# Patient Record
Sex: Female | Born: 1945 | Race: White | Hispanic: No | Smoking: Former smoker
Health system: Southern US, Community
[De-identification: ages and names within clinical notes are randomized; demographics above are authoritative.]

## PROBLEM LIST (undated history)

## (undated) DIAGNOSIS — K219 Gastro-esophageal reflux disease without esophagitis: Secondary | ICD-10-CM

## (undated) DIAGNOSIS — I519 Heart disease, unspecified: Secondary | ICD-10-CM

## (undated) DIAGNOSIS — E785 Hyperlipidemia, unspecified: Secondary | ICD-10-CM

## (undated) DIAGNOSIS — J45909 Unspecified asthma, uncomplicated: Secondary | ICD-10-CM

## (undated) DIAGNOSIS — Z952 Presence of prosthetic heart valve: Secondary | ICD-10-CM

## (undated) DIAGNOSIS — I10 Essential (primary) hypertension: Secondary | ICD-10-CM

## (undated) DIAGNOSIS — I350 Nonrheumatic aortic (valve) stenosis: Secondary | ICD-10-CM

## (undated) DIAGNOSIS — I509 Heart failure, unspecified: Secondary | ICD-10-CM

## (undated) DIAGNOSIS — I351 Nonrheumatic aortic (valve) insufficiency: Secondary | ICD-10-CM

## (undated) DIAGNOSIS — M1990 Unspecified osteoarthritis, unspecified site: Secondary | ICD-10-CM

## (undated) DIAGNOSIS — G2581 Restless legs syndrome: Secondary | ICD-10-CM

## (undated) DIAGNOSIS — C50919 Malignant neoplasm of unspecified site of unspecified female breast: Secondary | ICD-10-CM

## (undated) HISTORY — PX: BREAST SURGERY: SHX581

## (undated) HISTORY — PX: CATARACT EXTRACTION: SUR2

## (undated) HISTORY — DX: Restless legs syndrome: G25.81

## (undated) HISTORY — DX: Heart failure, unspecified: I50.9

## (undated) HISTORY — PX: BELPHAROPTOSIS REPAIR: SHX369

## (undated) HISTORY — PX: ROTATOR CUFF REPAIR: SHX139

## (undated) HISTORY — PX: TONSILLECTOMY: SUR1361

## (undated) HISTORY — PX: ABDOMINAL HYSTERECTOMY: SHX81

## (undated) HISTORY — DX: Heart disease, unspecified: I51.9

## (undated) HISTORY — DX: Nonrheumatic aortic (valve) insufficiency: I35.1

## (undated) HISTORY — PX: JOINT REPLACEMENT: SHX530

## (undated) HISTORY — DX: Hyperlipidemia, unspecified: E78.5

## (undated) HISTORY — PX: EYE SURGERY: SHX253

## (undated) HISTORY — DX: Essential (primary) hypertension: I10

---

## 2011-08-02 DIAGNOSIS — Z01419 Encounter for gynecological examination (general) (routine) without abnormal findings: Secondary | ICD-10-CM | POA: Diagnosis not present

## 2011-08-02 DIAGNOSIS — Z1272 Encounter for screening for malignant neoplasm of vagina: Secondary | ICD-10-CM | POA: Diagnosis not present

## 2011-08-02 DIAGNOSIS — N72 Inflammatory disease of cervix uteri: Secondary | ICD-10-CM | POA: Diagnosis not present

## 2011-08-02 DIAGNOSIS — Z0189 Encounter for other specified special examinations: Secondary | ICD-10-CM | POA: Diagnosis not present

## 2011-08-02 DIAGNOSIS — N880 Leukoplakia of cervix uteri: Secondary | ICD-10-CM | POA: Diagnosis not present

## 2011-08-02 DIAGNOSIS — Z124 Encounter for screening for malignant neoplasm of cervix: Secondary | ICD-10-CM | POA: Diagnosis not present

## 2011-08-02 DIAGNOSIS — Z1289 Encounter for screening for malignant neoplasm of other sites: Secondary | ICD-10-CM | POA: Diagnosis not present

## 2011-08-07 DIAGNOSIS — S93409A Sprain of unspecified ligament of unspecified ankle, initial encounter: Secondary | ICD-10-CM | POA: Diagnosis not present

## 2011-08-15 DIAGNOSIS — N959 Unspecified menopausal and perimenopausal disorder: Secondary | ICD-10-CM | POA: Diagnosis not present

## 2011-08-17 DIAGNOSIS — B351 Tinea unguium: Secondary | ICD-10-CM | POA: Diagnosis not present

## 2011-08-17 DIAGNOSIS — L600 Ingrowing nail: Secondary | ICD-10-CM | POA: Diagnosis not present

## 2011-08-17 DIAGNOSIS — S90129A Contusion of unspecified lesser toe(s) without damage to nail, initial encounter: Secondary | ICD-10-CM | POA: Diagnosis not present

## 2011-10-02 DIAGNOSIS — M25559 Pain in unspecified hip: Secondary | ICD-10-CM | POA: Diagnosis not present

## 2011-10-11 DIAGNOSIS — IMO0002 Reserved for concepts with insufficient information to code with codable children: Secondary | ICD-10-CM | POA: Diagnosis not present

## 2011-10-23 DIAGNOSIS — K589 Irritable bowel syndrome without diarrhea: Secondary | ICD-10-CM | POA: Diagnosis not present

## 2011-10-23 DIAGNOSIS — K8689 Other specified diseases of pancreas: Secondary | ICD-10-CM | POA: Diagnosis not present

## 2011-10-23 DIAGNOSIS — R142 Eructation: Secondary | ICD-10-CM | POA: Diagnosis not present

## 2011-10-23 DIAGNOSIS — R198 Other specified symptoms and signs involving the digestive system and abdomen: Secondary | ICD-10-CM | POA: Diagnosis not present

## 2011-10-24 DIAGNOSIS — IMO0002 Reserved for concepts with insufficient information to code with codable children: Secondary | ICD-10-CM | POA: Diagnosis not present

## 2011-10-26 DIAGNOSIS — IMO0002 Reserved for concepts with insufficient information to code with codable children: Secondary | ICD-10-CM | POA: Diagnosis not present

## 2011-10-30 DIAGNOSIS — IMO0002 Reserved for concepts with insufficient information to code with codable children: Secondary | ICD-10-CM | POA: Diagnosis not present

## 2011-11-01 DIAGNOSIS — IMO0002 Reserved for concepts with insufficient information to code with codable children: Secondary | ICD-10-CM | POA: Diagnosis not present

## 2011-11-06 DIAGNOSIS — IMO0002 Reserved for concepts with insufficient information to code with codable children: Secondary | ICD-10-CM | POA: Diagnosis not present

## 2011-11-08 DIAGNOSIS — IMO0002 Reserved for concepts with insufficient information to code with codable children: Secondary | ICD-10-CM | POA: Diagnosis not present

## 2011-11-13 DIAGNOSIS — Z79899 Other long term (current) drug therapy: Secondary | ICD-10-CM | POA: Diagnosis not present

## 2011-11-13 DIAGNOSIS — M25559 Pain in unspecified hip: Secondary | ICD-10-CM | POA: Diagnosis not present

## 2011-11-13 DIAGNOSIS — D6489 Other specified anemias: Secondary | ICD-10-CM | POA: Diagnosis not present

## 2011-11-13 DIAGNOSIS — C50319 Malignant neoplasm of lower-inner quadrant of unspecified female breast: Secondary | ICD-10-CM | POA: Diagnosis not present

## 2011-11-16 DIAGNOSIS — L0889 Other specified local infections of the skin and subcutaneous tissue: Secondary | ICD-10-CM | POA: Diagnosis not present

## 2011-11-16 DIAGNOSIS — IMO0002 Reserved for concepts with insufficient information to code with codable children: Secondary | ICD-10-CM | POA: Diagnosis not present

## 2011-11-20 DIAGNOSIS — IMO0002 Reserved for concepts with insufficient information to code with codable children: Secondary | ICD-10-CM | POA: Diagnosis not present

## 2011-11-22 DIAGNOSIS — IMO0002 Reserved for concepts with insufficient information to code with codable children: Secondary | ICD-10-CM | POA: Diagnosis not present

## 2011-11-26 DIAGNOSIS — E785 Hyperlipidemia, unspecified: Secondary | ICD-10-CM | POA: Diagnosis not present

## 2011-11-27 DIAGNOSIS — IMO0002 Reserved for concepts with insufficient information to code with codable children: Secondary | ICD-10-CM | POA: Diagnosis not present

## 2011-11-29 DIAGNOSIS — IMO0002 Reserved for concepts with insufficient information to code with codable children: Secondary | ICD-10-CM | POA: Diagnosis not present

## 2011-12-11 DIAGNOSIS — I83893 Varicose veins of bilateral lower extremities with other complications: Secondary | ICD-10-CM | POA: Diagnosis not present

## 2011-12-19 DIAGNOSIS — E785 Hyperlipidemia, unspecified: Secondary | ICD-10-CM | POA: Diagnosis not present

## 2012-01-21 DIAGNOSIS — I83893 Varicose veins of bilateral lower extremities with other complications: Secondary | ICD-10-CM | POA: Diagnosis not present

## 2012-01-29 DIAGNOSIS — E785 Hyperlipidemia, unspecified: Secondary | ICD-10-CM | POA: Diagnosis not present

## 2012-01-31 DIAGNOSIS — Z1231 Encounter for screening mammogram for malignant neoplasm of breast: Secondary | ICD-10-CM | POA: Diagnosis not present

## 2012-02-13 DIAGNOSIS — Z86010 Personal history of colonic polyps: Secondary | ICD-10-CM | POA: Diagnosis not present

## 2012-02-13 DIAGNOSIS — Z853 Personal history of malignant neoplasm of breast: Secondary | ICD-10-CM | POA: Diagnosis not present

## 2012-02-15 DIAGNOSIS — IMO0002 Reserved for concepts with insufficient information to code with codable children: Secondary | ICD-10-CM | POA: Diagnosis not present

## 2012-02-18 DIAGNOSIS — I83893 Varicose veins of bilateral lower extremities with other complications: Secondary | ICD-10-CM | POA: Diagnosis not present

## 2012-02-21 DIAGNOSIS — I83893 Varicose veins of bilateral lower extremities with other complications: Secondary | ICD-10-CM | POA: Diagnosis not present

## 2012-02-29 DIAGNOSIS — H353190 Nonexudative age-related macular degeneration, unspecified eye, stage unspecified: Secondary | ICD-10-CM | POA: Diagnosis not present

## 2012-02-29 DIAGNOSIS — Z961 Presence of intraocular lens: Secondary | ICD-10-CM | POA: Diagnosis not present

## 2012-04-03 DIAGNOSIS — E785 Hyperlipidemia, unspecified: Secondary | ICD-10-CM | POA: Diagnosis not present

## 2012-04-10 DIAGNOSIS — I83893 Varicose veins of bilateral lower extremities with other complications: Secondary | ICD-10-CM | POA: Diagnosis not present

## 2012-04-24 DIAGNOSIS — L2089 Other atopic dermatitis: Secondary | ICD-10-CM | POA: Diagnosis not present

## 2012-04-24 DIAGNOSIS — E785 Hyperlipidemia, unspecified: Secondary | ICD-10-CM | POA: Diagnosis not present

## 2012-04-24 DIAGNOSIS — I10 Essential (primary) hypertension: Secondary | ICD-10-CM | POA: Diagnosis not present

## 2012-04-24 DIAGNOSIS — R7989 Other specified abnormal findings of blood chemistry: Secondary | ICD-10-CM | POA: Diagnosis not present

## 2012-04-24 DIAGNOSIS — H60399 Other infective otitis externa, unspecified ear: Secondary | ICD-10-CM | POA: Diagnosis not present

## 2012-04-24 DIAGNOSIS — Z23 Encounter for immunization: Secondary | ICD-10-CM | POA: Diagnosis not present

## 2012-05-01 DIAGNOSIS — I83893 Varicose veins of bilateral lower extremities with other complications: Secondary | ICD-10-CM | POA: Diagnosis not present

## 2012-05-08 DIAGNOSIS — I10 Essential (primary) hypertension: Secondary | ICD-10-CM | POA: Diagnosis not present

## 2012-05-21 DIAGNOSIS — L80 Vitiligo: Secondary | ICD-10-CM | POA: Diagnosis not present

## 2012-05-21 DIAGNOSIS — L988 Other specified disorders of the skin and subcutaneous tissue: Secondary | ICD-10-CM | POA: Diagnosis not present

## 2012-05-21 DIAGNOSIS — L821 Other seborrheic keratosis: Secondary | ICD-10-CM | POA: Diagnosis not present

## 2012-05-21 DIAGNOSIS — B079 Viral wart, unspecified: Secondary | ICD-10-CM | POA: Diagnosis not present

## 2012-05-27 DIAGNOSIS — I83893 Varicose veins of bilateral lower extremities with other complications: Secondary | ICD-10-CM | POA: Diagnosis not present

## 2012-06-03 DIAGNOSIS — C50319 Malignant neoplasm of lower-inner quadrant of unspecified female breast: Secondary | ICD-10-CM | POA: Diagnosis not present

## 2012-06-03 DIAGNOSIS — Z79899 Other long term (current) drug therapy: Secondary | ICD-10-CM | POA: Diagnosis not present

## 2012-06-03 DIAGNOSIS — D6489 Other specified anemias: Secondary | ICD-10-CM | POA: Diagnosis not present

## 2012-07-08 DIAGNOSIS — I83893 Varicose veins of bilateral lower extremities with other complications: Secondary | ICD-10-CM | POA: Diagnosis not present

## 2012-07-28 DIAGNOSIS — R7989 Other specified abnormal findings of blood chemistry: Secondary | ICD-10-CM | POA: Diagnosis not present

## 2012-07-28 DIAGNOSIS — M1990 Unspecified osteoarthritis, unspecified site: Secondary | ICD-10-CM | POA: Diagnosis not present

## 2012-07-28 DIAGNOSIS — E785 Hyperlipidemia, unspecified: Secondary | ICD-10-CM | POA: Diagnosis not present

## 2012-08-19 DIAGNOSIS — L2089 Other atopic dermatitis: Secondary | ICD-10-CM | POA: Diagnosis not present

## 2012-08-19 DIAGNOSIS — B079 Viral wart, unspecified: Secondary | ICD-10-CM | POA: Diagnosis not present

## 2012-10-10 DIAGNOSIS — L600 Ingrowing nail: Secondary | ICD-10-CM | POA: Diagnosis not present

## 2012-11-03 DIAGNOSIS — E559 Vitamin D deficiency, unspecified: Secondary | ICD-10-CM | POA: Diagnosis not present

## 2012-11-03 DIAGNOSIS — R5383 Other fatigue: Secondary | ICD-10-CM | POA: Diagnosis not present

## 2012-11-03 DIAGNOSIS — C50919 Malignant neoplasm of unspecified site of unspecified female breast: Secondary | ICD-10-CM | POA: Diagnosis not present

## 2012-11-03 DIAGNOSIS — M1990 Unspecified osteoarthritis, unspecified site: Secondary | ICD-10-CM | POA: Diagnosis not present

## 2012-11-03 DIAGNOSIS — D649 Anemia, unspecified: Secondary | ICD-10-CM | POA: Diagnosis not present

## 2012-11-03 DIAGNOSIS — I10 Essential (primary) hypertension: Secondary | ICD-10-CM | POA: Diagnosis not present

## 2012-11-03 DIAGNOSIS — E119 Type 2 diabetes mellitus without complications: Secondary | ICD-10-CM | POA: Diagnosis not present

## 2012-11-03 DIAGNOSIS — R7989 Other specified abnormal findings of blood chemistry: Secondary | ICD-10-CM | POA: Diagnosis not present

## 2012-11-03 DIAGNOSIS — M899 Disorder of bone, unspecified: Secondary | ICD-10-CM | POA: Diagnosis not present

## 2012-11-03 DIAGNOSIS — E785 Hyperlipidemia, unspecified: Secondary | ICD-10-CM | POA: Diagnosis not present

## 2012-11-06 DIAGNOSIS — C50919 Malignant neoplasm of unspecified site of unspecified female breast: Secondary | ICD-10-CM | POA: Diagnosis not present

## 2012-11-06 DIAGNOSIS — E785 Hyperlipidemia, unspecified: Secondary | ICD-10-CM | POA: Diagnosis not present

## 2012-11-06 DIAGNOSIS — Z0000 Encounter for general adult medical examination without abnormal findings: Secondary | ICD-10-CM | POA: Diagnosis not present

## 2012-11-18 DIAGNOSIS — D6489 Other specified anemias: Secondary | ICD-10-CM | POA: Diagnosis not present

## 2012-11-18 DIAGNOSIS — Z79899 Other long term (current) drug therapy: Secondary | ICD-10-CM | POA: Diagnosis not present

## 2012-11-18 DIAGNOSIS — C50319 Malignant neoplasm of lower-inner quadrant of unspecified female breast: Secondary | ICD-10-CM | POA: Diagnosis not present

## 2012-12-29 DIAGNOSIS — T6391XA Toxic effect of contact with unspecified venomous animal, accidental (unintentional), initial encounter: Secondary | ICD-10-CM | POA: Diagnosis not present

## 2013-01-14 DIAGNOSIS — E7800 Pure hypercholesterolemia, unspecified: Secondary | ICD-10-CM | POA: Diagnosis not present

## 2013-01-14 DIAGNOSIS — Z853 Personal history of malignant neoplasm of breast: Secondary | ICD-10-CM | POA: Diagnosis not present

## 2013-01-14 DIAGNOSIS — K219 Gastro-esophageal reflux disease without esophagitis: Secondary | ICD-10-CM | POA: Diagnosis not present

## 2013-02-09 DIAGNOSIS — H04129 Dry eye syndrome of unspecified lacrimal gland: Secondary | ICD-10-CM | POA: Diagnosis not present

## 2013-02-14 DIAGNOSIS — T6391XA Toxic effect of contact with unspecified venomous animal, accidental (unintentional), initial encounter: Secondary | ICD-10-CM | POA: Diagnosis not present

## 2013-02-20 ENCOUNTER — Telehealth: Payer: Self-pay | Admitting: Hematology & Oncology

## 2013-02-20 NOTE — Telephone Encounter (Signed)
Pt aware of 9-11 °

## 2013-03-03 DIAGNOSIS — M25519 Pain in unspecified shoulder: Secondary | ICD-10-CM | POA: Diagnosis not present

## 2013-03-03 DIAGNOSIS — K219 Gastro-esophageal reflux disease without esophagitis: Secondary | ICD-10-CM | POA: Diagnosis not present

## 2013-03-09 DIAGNOSIS — M25519 Pain in unspecified shoulder: Secondary | ICD-10-CM | POA: Diagnosis not present

## 2013-03-17 DIAGNOSIS — M25519 Pain in unspecified shoulder: Secondary | ICD-10-CM | POA: Diagnosis not present

## 2013-03-18 DIAGNOSIS — Z96649 Presence of unspecified artificial hip joint: Secondary | ICD-10-CM | POA: Diagnosis not present

## 2013-03-18 DIAGNOSIS — S7000XA Contusion of unspecified hip, initial encounter: Secondary | ICD-10-CM | POA: Diagnosis not present

## 2013-03-19 DIAGNOSIS — Z853 Personal history of malignant neoplasm of breast: Secondary | ICD-10-CM | POA: Diagnosis not present

## 2013-03-19 DIAGNOSIS — M25519 Pain in unspecified shoulder: Secondary | ICD-10-CM | POA: Diagnosis not present

## 2013-03-19 DIAGNOSIS — R011 Cardiac murmur, unspecified: Secondary | ICD-10-CM | POA: Diagnosis not present

## 2013-03-19 DIAGNOSIS — R002 Palpitations: Secondary | ICD-10-CM | POA: Diagnosis not present

## 2013-03-20 ENCOUNTER — Telehealth: Payer: Self-pay | Admitting: Hematology & Oncology

## 2013-03-20 NOTE — Telephone Encounter (Signed)
Pt cx 9-11 is being seen in [place]

## 2013-03-25 DIAGNOSIS — M25519 Pain in unspecified shoulder: Secondary | ICD-10-CM | POA: Diagnosis not present

## 2013-03-25 DIAGNOSIS — Z23 Encounter for immunization: Secondary | ICD-10-CM | POA: Diagnosis not present

## 2013-03-25 DIAGNOSIS — I499 Cardiac arrhythmia, unspecified: Secondary | ICD-10-CM | POA: Diagnosis not present

## 2013-03-26 DIAGNOSIS — I498 Other specified cardiac arrhythmias: Secondary | ICD-10-CM | POA: Diagnosis not present

## 2013-03-26 DIAGNOSIS — I499 Cardiac arrhythmia, unspecified: Secondary | ICD-10-CM | POA: Diagnosis not present

## 2013-03-27 DIAGNOSIS — M25519 Pain in unspecified shoulder: Secondary | ICD-10-CM | POA: Diagnosis not present

## 2013-03-31 DIAGNOSIS — M25519 Pain in unspecified shoulder: Secondary | ICD-10-CM | POA: Diagnosis not present

## 2013-04-02 ENCOUNTER — Other Ambulatory Visit: Payer: Self-pay | Admitting: Lab

## 2013-04-02 ENCOUNTER — Ambulatory Visit: Payer: Self-pay

## 2013-04-02 ENCOUNTER — Ambulatory Visit: Payer: Self-pay | Admitting: Hematology & Oncology

## 2013-04-03 DIAGNOSIS — M25519 Pain in unspecified shoulder: Secondary | ICD-10-CM | POA: Diagnosis not present

## 2013-04-06 DIAGNOSIS — M25519 Pain in unspecified shoulder: Secondary | ICD-10-CM | POA: Diagnosis not present

## 2013-04-08 DIAGNOSIS — M25519 Pain in unspecified shoulder: Secondary | ICD-10-CM | POA: Diagnosis not present

## 2013-04-10 DIAGNOSIS — M25519 Pain in unspecified shoulder: Secondary | ICD-10-CM | POA: Diagnosis not present

## 2013-06-02 DIAGNOSIS — L509 Urticaria, unspecified: Secondary | ICD-10-CM | POA: Diagnosis not present

## 2013-06-02 DIAGNOSIS — Z006 Encounter for examination for normal comparison and control in clinical research program: Secondary | ICD-10-CM | POA: Diagnosis not present

## 2013-06-02 DIAGNOSIS — K219 Gastro-esophageal reflux disease without esophagitis: Secondary | ICD-10-CM | POA: Diagnosis not present

## 2013-06-02 DIAGNOSIS — E7800 Pure hypercholesterolemia, unspecified: Secondary | ICD-10-CM | POA: Diagnosis not present

## 2013-06-02 DIAGNOSIS — I499 Cardiac arrhythmia, unspecified: Secondary | ICD-10-CM | POA: Diagnosis not present

## 2013-07-22 DIAGNOSIS — C50319 Malignant neoplasm of lower-inner quadrant of unspecified female breast: Secondary | ICD-10-CM | POA: Diagnosis not present

## 2013-07-22 DIAGNOSIS — R922 Inconclusive mammogram: Secondary | ICD-10-CM | POA: Diagnosis not present

## 2013-07-28 DIAGNOSIS — M25819 Other specified joint disorders, unspecified shoulder: Secondary | ICD-10-CM | POA: Diagnosis not present

## 2013-09-22 DIAGNOSIS — M8580 Other specified disorders of bone density and structure, unspecified site: Secondary | ICD-10-CM | POA: Insufficient documentation

## 2013-09-22 DIAGNOSIS — Z780 Asymptomatic menopausal state: Secondary | ICD-10-CM | POA: Insufficient documentation

## 2013-09-22 HISTORY — DX: Other specified disorders of bone density and structure, unspecified site: M85.80

## 2013-09-30 DIAGNOSIS — M899 Disorder of bone, unspecified: Secondary | ICD-10-CM | POA: Diagnosis not present

## 2013-09-30 DIAGNOSIS — M949 Disorder of cartilage, unspecified: Secondary | ICD-10-CM | POA: Diagnosis not present

## 2013-09-30 DIAGNOSIS — Z13820 Encounter for screening for osteoporosis: Secondary | ICD-10-CM | POA: Diagnosis not present

## 2013-10-05 DIAGNOSIS — Z09 Encounter for follow-up examination after completed treatment for conditions other than malignant neoplasm: Secondary | ICD-10-CM | POA: Diagnosis not present

## 2013-10-05 DIAGNOSIS — M899 Disorder of bone, unspecified: Secondary | ICD-10-CM | POA: Diagnosis not present

## 2013-10-05 DIAGNOSIS — Z853 Personal history of malignant neoplasm of breast: Secondary | ICD-10-CM | POA: Diagnosis not present

## 2013-10-05 DIAGNOSIS — M949 Disorder of cartilage, unspecified: Secondary | ICD-10-CM | POA: Diagnosis not present

## 2013-10-15 DIAGNOSIS — M779 Enthesopathy, unspecified: Secondary | ICD-10-CM | POA: Diagnosis not present

## 2013-12-04 DIAGNOSIS — E7800 Pure hypercholesterolemia, unspecified: Secondary | ICD-10-CM | POA: Diagnosis not present

## 2013-12-04 DIAGNOSIS — N959 Unspecified menopausal and perimenopausal disorder: Secondary | ICD-10-CM | POA: Diagnosis not present

## 2013-12-04 DIAGNOSIS — K219 Gastro-esophageal reflux disease without esophagitis: Secondary | ICD-10-CM | POA: Diagnosis not present

## 2014-01-04 DIAGNOSIS — N959 Unspecified menopausal and perimenopausal disorder: Secondary | ICD-10-CM | POA: Diagnosis not present

## 2014-01-04 DIAGNOSIS — Z0000 Encounter for general adult medical examination without abnormal findings: Secondary | ICD-10-CM | POA: Diagnosis not present

## 2014-01-04 DIAGNOSIS — Z23 Encounter for immunization: Secondary | ICD-10-CM | POA: Diagnosis not present

## 2014-03-03 DIAGNOSIS — Z79899 Other long term (current) drug therapy: Secondary | ICD-10-CM | POA: Diagnosis not present

## 2014-03-03 DIAGNOSIS — E782 Mixed hyperlipidemia: Secondary | ICD-10-CM | POA: Diagnosis not present

## 2014-03-17 DIAGNOSIS — H04129 Dry eye syndrome of unspecified lacrimal gland: Secondary | ICD-10-CM | POA: Diagnosis not present

## 2014-04-23 DIAGNOSIS — Z23 Encounter for immunization: Secondary | ICD-10-CM | POA: Diagnosis not present

## 2014-05-22 DIAGNOSIS — X58XXXA Exposure to other specified factors, initial encounter: Secondary | ICD-10-CM | POA: Diagnosis not present

## 2014-05-22 DIAGNOSIS — S62396A Other fracture of fifth metacarpal bone, right hand, initial encounter for closed fracture: Secondary | ICD-10-CM | POA: Diagnosis not present

## 2014-05-22 DIAGNOSIS — S62309A Unspecified fracture of unspecified metacarpal bone, initial encounter for closed fracture: Secondary | ICD-10-CM | POA: Diagnosis not present

## 2014-05-22 DIAGNOSIS — S62316A Displaced fracture of base of fifth metacarpal bone, right hand, initial encounter for closed fracture: Secondary | ICD-10-CM | POA: Diagnosis not present

## 2014-05-22 DIAGNOSIS — S6991XA Unspecified injury of right wrist, hand and finger(s), initial encounter: Secondary | ICD-10-CM | POA: Diagnosis not present

## 2014-05-22 DIAGNOSIS — S62398A Other fracture of other metacarpal bone, initial encounter for closed fracture: Secondary | ICD-10-CM | POA: Diagnosis not present

## 2014-05-22 DIAGNOSIS — W1839XA Other fall on same level, initial encounter: Secondary | ICD-10-CM | POA: Diagnosis not present

## 2014-05-25 DIAGNOSIS — S62319A Displaced fracture of base of unspecified metacarpal bone, initial encounter for closed fracture: Secondary | ICD-10-CM | POA: Diagnosis not present

## 2014-06-22 DIAGNOSIS — S62356D Nondisplaced fracture of shaft of fifth metacarpal bone, right hand, subsequent encounter for fracture with routine healing: Secondary | ICD-10-CM | POA: Diagnosis not present

## 2014-07-26 DIAGNOSIS — Z9889 Other specified postprocedural states: Secondary | ICD-10-CM | POA: Diagnosis not present

## 2014-07-26 DIAGNOSIS — C50312 Malignant neoplasm of lower-inner quadrant of left female breast: Secondary | ICD-10-CM | POA: Diagnosis not present

## 2014-07-26 DIAGNOSIS — R928 Other abnormal and inconclusive findings on diagnostic imaging of breast: Secondary | ICD-10-CM | POA: Diagnosis not present

## 2014-07-30 DIAGNOSIS — M8589 Other specified disorders of bone density and structure, multiple sites: Secondary | ICD-10-CM | POA: Diagnosis not present

## 2014-07-30 DIAGNOSIS — Z853 Personal history of malignant neoplasm of breast: Secondary | ICD-10-CM | POA: Diagnosis not present

## 2014-08-02 DIAGNOSIS — E780 Pure hypercholesterolemia: Secondary | ICD-10-CM | POA: Diagnosis not present

## 2014-08-02 DIAGNOSIS — I499 Cardiac arrhythmia, unspecified: Secondary | ICD-10-CM | POA: Diagnosis not present

## 2014-08-09 DIAGNOSIS — I499 Cardiac arrhythmia, unspecified: Secondary | ICD-10-CM | POA: Diagnosis not present

## 2014-08-09 DIAGNOSIS — I350 Nonrheumatic aortic (valve) stenosis: Secondary | ICD-10-CM | POA: Diagnosis not present

## 2014-08-09 DIAGNOSIS — I340 Nonrheumatic mitral (valve) insufficiency: Secondary | ICD-10-CM | POA: Diagnosis not present

## 2014-08-09 DIAGNOSIS — I361 Nonrheumatic tricuspid (valve) insufficiency: Secondary | ICD-10-CM | POA: Diagnosis not present

## 2014-08-09 DIAGNOSIS — I351 Nonrheumatic aortic (valve) insufficiency: Secondary | ICD-10-CM | POA: Diagnosis not present

## 2014-08-09 DIAGNOSIS — I071 Rheumatic tricuspid insufficiency: Secondary | ICD-10-CM | POA: Diagnosis not present

## 2014-08-10 DIAGNOSIS — S52356D Nondisplaced comminuted fracture of shaft of radius, unspecified arm, subsequent encounter for closed fracture with routine healing: Secondary | ICD-10-CM | POA: Diagnosis not present

## 2014-08-30 DIAGNOSIS — I499 Cardiac arrhythmia, unspecified: Secondary | ICD-10-CM | POA: Diagnosis not present

## 2014-08-30 DIAGNOSIS — I351 Nonrheumatic aortic (valve) insufficiency: Secondary | ICD-10-CM | POA: Diagnosis not present

## 2014-08-30 DIAGNOSIS — I10 Essential (primary) hypertension: Secondary | ICD-10-CM | POA: Diagnosis not present

## 2014-08-30 DIAGNOSIS — Q253 Supravalvular aortic stenosis: Secondary | ICD-10-CM | POA: Diagnosis not present

## 2014-09-08 DIAGNOSIS — R5381 Other malaise: Secondary | ICD-10-CM | POA: Diagnosis not present

## 2014-09-08 DIAGNOSIS — R0602 Shortness of breath: Secondary | ICD-10-CM | POA: Diagnosis not present

## 2014-09-08 DIAGNOSIS — I350 Nonrheumatic aortic (valve) stenosis: Secondary | ICD-10-CM | POA: Diagnosis not present

## 2014-09-08 DIAGNOSIS — G44009 Cluster headache syndrome, unspecified, not intractable: Secondary | ICD-10-CM | POA: Diagnosis not present

## 2014-09-14 DIAGNOSIS — R51 Headache: Secondary | ICD-10-CM | POA: Diagnosis not present

## 2014-09-14 DIAGNOSIS — G44009 Cluster headache syndrome, unspecified, not intractable: Secondary | ICD-10-CM | POA: Diagnosis not present

## 2014-09-14 DIAGNOSIS — Z853 Personal history of malignant neoplasm of breast: Secondary | ICD-10-CM | POA: Diagnosis not present

## 2014-09-22 DIAGNOSIS — I499 Cardiac arrhythmia, unspecified: Secondary | ICD-10-CM | POA: Diagnosis not present

## 2014-09-24 DIAGNOSIS — I499 Cardiac arrhythmia, unspecified: Secondary | ICD-10-CM | POA: Diagnosis not present

## 2014-10-14 DIAGNOSIS — I493 Ventricular premature depolarization: Secondary | ICD-10-CM | POA: Diagnosis not present

## 2014-10-14 DIAGNOSIS — I350 Nonrheumatic aortic (valve) stenosis: Secondary | ICD-10-CM | POA: Diagnosis not present

## 2014-10-14 DIAGNOSIS — I10 Essential (primary) hypertension: Secondary | ICD-10-CM | POA: Diagnosis not present

## 2015-03-14 DIAGNOSIS — Z79899 Other long term (current) drug therapy: Secondary | ICD-10-CM | POA: Diagnosis not present

## 2015-03-14 DIAGNOSIS — I10 Essential (primary) hypertension: Secondary | ICD-10-CM | POA: Diagnosis not present

## 2015-03-14 DIAGNOSIS — Z1389 Encounter for screening for other disorder: Secondary | ICD-10-CM | POA: Diagnosis not present

## 2015-03-14 DIAGNOSIS — Z0000 Encounter for general adult medical examination without abnormal findings: Secondary | ICD-10-CM | POA: Diagnosis not present

## 2015-03-14 DIAGNOSIS — E780 Pure hypercholesterolemia: Secondary | ICD-10-CM | POA: Diagnosis not present

## 2015-03-14 DIAGNOSIS — Z9181 History of falling: Secondary | ICD-10-CM | POA: Diagnosis not present

## 2015-03-31 DIAGNOSIS — I350 Nonrheumatic aortic (valve) stenosis: Secondary | ICD-10-CM | POA: Diagnosis not present

## 2015-03-31 DIAGNOSIS — I10 Essential (primary) hypertension: Secondary | ICD-10-CM | POA: Diagnosis not present

## 2015-03-31 DIAGNOSIS — I351 Nonrheumatic aortic (valve) insufficiency: Secondary | ICD-10-CM | POA: Diagnosis not present

## 2015-03-31 DIAGNOSIS — I493 Ventricular premature depolarization: Secondary | ICD-10-CM | POA: Diagnosis not present

## 2015-04-07 DIAGNOSIS — N8110 Cystocele, unspecified: Secondary | ICD-10-CM | POA: Diagnosis not present

## 2015-04-13 DIAGNOSIS — Z23 Encounter for immunization: Secondary | ICD-10-CM | POA: Diagnosis not present

## 2015-05-04 DIAGNOSIS — M25511 Pain in right shoulder: Secondary | ICD-10-CM | POA: Diagnosis not present

## 2015-05-04 DIAGNOSIS — M7541 Impingement syndrome of right shoulder: Secondary | ICD-10-CM | POA: Diagnosis not present

## 2015-05-10 DIAGNOSIS — M25511 Pain in right shoulder: Secondary | ICD-10-CM | POA: Diagnosis not present

## 2015-05-11 DIAGNOSIS — M75121 Complete rotator cuff tear or rupture of right shoulder, not specified as traumatic: Secondary | ICD-10-CM | POA: Diagnosis not present

## 2015-05-24 DIAGNOSIS — H524 Presbyopia: Secondary | ICD-10-CM | POA: Diagnosis not present

## 2015-05-24 DIAGNOSIS — H26493 Other secondary cataract, bilateral: Secondary | ICD-10-CM | POA: Diagnosis not present

## 2015-05-26 DIAGNOSIS — G8918 Other acute postprocedural pain: Secondary | ICD-10-CM | POA: Diagnosis not present

## 2015-05-26 DIAGNOSIS — E7800 Pure hypercholesterolemia, unspecified: Secondary | ICD-10-CM | POA: Diagnosis not present

## 2015-05-26 DIAGNOSIS — M75111 Incomplete rotator cuff tear or rupture of right shoulder, not specified as traumatic: Secondary | ICD-10-CM | POA: Diagnosis not present

## 2015-05-26 DIAGNOSIS — M75121 Complete rotator cuff tear or rupture of right shoulder, not specified as traumatic: Secondary | ICD-10-CM | POA: Diagnosis not present

## 2015-05-26 DIAGNOSIS — I10 Essential (primary) hypertension: Secondary | ICD-10-CM | POA: Diagnosis not present

## 2015-05-26 DIAGNOSIS — K219 Gastro-esophageal reflux disease without esophagitis: Secondary | ICD-10-CM | POA: Diagnosis not present

## 2015-05-26 DIAGNOSIS — M7541 Impingement syndrome of right shoulder: Secondary | ICD-10-CM | POA: Diagnosis not present

## 2015-05-26 DIAGNOSIS — M24011 Loose body in right shoulder: Secondary | ICD-10-CM | POA: Diagnosis not present

## 2015-05-26 DIAGNOSIS — Z853 Personal history of malignant neoplasm of breast: Secondary | ICD-10-CM | POA: Diagnosis not present

## 2015-05-26 DIAGNOSIS — Z87891 Personal history of nicotine dependence: Secondary | ICD-10-CM | POA: Diagnosis not present

## 2015-05-26 DIAGNOSIS — M65811 Other synovitis and tenosynovitis, right shoulder: Secondary | ICD-10-CM | POA: Diagnosis not present

## 2015-05-26 DIAGNOSIS — M1990 Unspecified osteoarthritis, unspecified site: Secondary | ICD-10-CM | POA: Diagnosis not present

## 2015-05-26 DIAGNOSIS — E785 Hyperlipidemia, unspecified: Secondary | ICD-10-CM | POA: Diagnosis not present

## 2015-05-26 DIAGNOSIS — M7551 Bursitis of right shoulder: Secondary | ICD-10-CM | POA: Diagnosis not present

## 2015-05-30 DIAGNOSIS — R531 Weakness: Secondary | ICD-10-CM | POA: Diagnosis not present

## 2015-05-30 DIAGNOSIS — M75121 Complete rotator cuff tear or rupture of right shoulder, not specified as traumatic: Secondary | ICD-10-CM | POA: Diagnosis not present

## 2015-05-30 DIAGNOSIS — R0602 Shortness of breath: Secondary | ICD-10-CM | POA: Diagnosis not present

## 2015-05-30 DIAGNOSIS — R404 Transient alteration of awareness: Secondary | ICD-10-CM | POA: Diagnosis not present

## 2015-05-30 DIAGNOSIS — R42 Dizziness and giddiness: Secondary | ICD-10-CM | POA: Diagnosis not present

## 2015-05-30 DIAGNOSIS — R55 Syncope and collapse: Secondary | ICD-10-CM | POA: Diagnosis not present

## 2015-05-30 DIAGNOSIS — M25511 Pain in right shoulder: Secondary | ICD-10-CM | POA: Diagnosis not present

## 2015-05-31 DIAGNOSIS — M25511 Pain in right shoulder: Secondary | ICD-10-CM | POA: Diagnosis not present

## 2015-05-31 DIAGNOSIS — M75121 Complete rotator cuff tear or rupture of right shoulder, not specified as traumatic: Secondary | ICD-10-CM | POA: Diagnosis not present

## 2015-06-02 DIAGNOSIS — M75121 Complete rotator cuff tear or rupture of right shoulder, not specified as traumatic: Secondary | ICD-10-CM | POA: Diagnosis not present

## 2015-06-02 DIAGNOSIS — M25511 Pain in right shoulder: Secondary | ICD-10-CM | POA: Diagnosis not present

## 2015-06-06 DIAGNOSIS — M75121 Complete rotator cuff tear or rupture of right shoulder, not specified as traumatic: Secondary | ICD-10-CM | POA: Diagnosis not present

## 2015-06-06 DIAGNOSIS — M25511 Pain in right shoulder: Secondary | ICD-10-CM | POA: Diagnosis not present

## 2015-06-09 DIAGNOSIS — M75121 Complete rotator cuff tear or rupture of right shoulder, not specified as traumatic: Secondary | ICD-10-CM | POA: Diagnosis not present

## 2015-06-09 DIAGNOSIS — M25511 Pain in right shoulder: Secondary | ICD-10-CM | POA: Diagnosis not present

## 2015-06-13 DIAGNOSIS — M75121 Complete rotator cuff tear or rupture of right shoulder, not specified as traumatic: Secondary | ICD-10-CM | POA: Diagnosis not present

## 2015-06-13 DIAGNOSIS — M25511 Pain in right shoulder: Secondary | ICD-10-CM | POA: Diagnosis not present

## 2015-06-17 DIAGNOSIS — M75121 Complete rotator cuff tear or rupture of right shoulder, not specified as traumatic: Secondary | ICD-10-CM | POA: Diagnosis not present

## 2015-06-17 DIAGNOSIS — M25511 Pain in right shoulder: Secondary | ICD-10-CM | POA: Diagnosis not present

## 2015-06-21 DIAGNOSIS — M25511 Pain in right shoulder: Secondary | ICD-10-CM | POA: Diagnosis not present

## 2015-06-21 DIAGNOSIS — M75121 Complete rotator cuff tear or rupture of right shoulder, not specified as traumatic: Secondary | ICD-10-CM | POA: Diagnosis not present

## 2015-06-24 DIAGNOSIS — M25511 Pain in right shoulder: Secondary | ICD-10-CM | POA: Diagnosis not present

## 2015-06-24 DIAGNOSIS — M75121 Complete rotator cuff tear or rupture of right shoulder, not specified as traumatic: Secondary | ICD-10-CM | POA: Diagnosis not present

## 2015-06-27 DIAGNOSIS — M75121 Complete rotator cuff tear or rupture of right shoulder, not specified as traumatic: Secondary | ICD-10-CM | POA: Diagnosis not present

## 2015-06-27 DIAGNOSIS — M25511 Pain in right shoulder: Secondary | ICD-10-CM | POA: Diagnosis not present

## 2015-06-30 DIAGNOSIS — M75121 Complete rotator cuff tear or rupture of right shoulder, not specified as traumatic: Secondary | ICD-10-CM | POA: Diagnosis not present

## 2015-06-30 DIAGNOSIS — M25511 Pain in right shoulder: Secondary | ICD-10-CM | POA: Diagnosis not present

## 2015-07-05 DIAGNOSIS — M75121 Complete rotator cuff tear or rupture of right shoulder, not specified as traumatic: Secondary | ICD-10-CM | POA: Diagnosis not present

## 2015-07-05 DIAGNOSIS — M25511 Pain in right shoulder: Secondary | ICD-10-CM | POA: Diagnosis not present

## 2015-07-08 DIAGNOSIS — M75121 Complete rotator cuff tear or rupture of right shoulder, not specified as traumatic: Secondary | ICD-10-CM | POA: Diagnosis not present

## 2015-07-08 DIAGNOSIS — M25511 Pain in right shoulder: Secondary | ICD-10-CM | POA: Diagnosis not present

## 2015-07-12 DIAGNOSIS — M25511 Pain in right shoulder: Secondary | ICD-10-CM | POA: Diagnosis not present

## 2015-07-12 DIAGNOSIS — M75121 Complete rotator cuff tear or rupture of right shoulder, not specified as traumatic: Secondary | ICD-10-CM | POA: Diagnosis not present

## 2015-07-15 DIAGNOSIS — M25511 Pain in right shoulder: Secondary | ICD-10-CM | POA: Diagnosis not present

## 2015-07-15 DIAGNOSIS — M75121 Complete rotator cuff tear or rupture of right shoulder, not specified as traumatic: Secondary | ICD-10-CM | POA: Diagnosis not present

## 2015-07-19 DIAGNOSIS — M25511 Pain in right shoulder: Secondary | ICD-10-CM | POA: Diagnosis not present

## 2015-07-19 DIAGNOSIS — M75121 Complete rotator cuff tear or rupture of right shoulder, not specified as traumatic: Secondary | ICD-10-CM | POA: Diagnosis not present

## 2015-07-22 DIAGNOSIS — M25511 Pain in right shoulder: Secondary | ICD-10-CM | POA: Diagnosis not present

## 2015-07-22 DIAGNOSIS — M75121 Complete rotator cuff tear or rupture of right shoulder, not specified as traumatic: Secondary | ICD-10-CM | POA: Diagnosis not present

## 2015-07-26 DIAGNOSIS — M75121 Complete rotator cuff tear or rupture of right shoulder, not specified as traumatic: Secondary | ICD-10-CM | POA: Diagnosis not present

## 2015-07-26 DIAGNOSIS — M25511 Pain in right shoulder: Secondary | ICD-10-CM | POA: Diagnosis not present

## 2015-07-27 DIAGNOSIS — Z0000 Encounter for general adult medical examination without abnormal findings: Secondary | ICD-10-CM | POA: Diagnosis not present

## 2015-07-27 DIAGNOSIS — R351 Nocturia: Secondary | ICD-10-CM | POA: Diagnosis not present

## 2015-07-27 DIAGNOSIS — R350 Frequency of micturition: Secondary | ICD-10-CM | POA: Diagnosis not present

## 2015-07-27 DIAGNOSIS — R102 Pelvic and perineal pain: Secondary | ICD-10-CM | POA: Diagnosis not present

## 2015-07-29 DIAGNOSIS — M75121 Complete rotator cuff tear or rupture of right shoulder, not specified as traumatic: Secondary | ICD-10-CM | POA: Diagnosis not present

## 2015-07-29 DIAGNOSIS — M25511 Pain in right shoulder: Secondary | ICD-10-CM | POA: Diagnosis not present

## 2015-08-04 DIAGNOSIS — M25511 Pain in right shoulder: Secondary | ICD-10-CM | POA: Diagnosis not present

## 2015-08-04 DIAGNOSIS — M75121 Complete rotator cuff tear or rupture of right shoulder, not specified as traumatic: Secondary | ICD-10-CM | POA: Diagnosis not present

## 2015-08-05 DIAGNOSIS — M25511 Pain in right shoulder: Secondary | ICD-10-CM | POA: Diagnosis not present

## 2015-08-05 DIAGNOSIS — M75121 Complete rotator cuff tear or rupture of right shoulder, not specified as traumatic: Secondary | ICD-10-CM | POA: Diagnosis not present

## 2015-08-08 DIAGNOSIS — M25511 Pain in right shoulder: Secondary | ICD-10-CM | POA: Diagnosis not present

## 2015-08-08 DIAGNOSIS — M75121 Complete rotator cuff tear or rupture of right shoulder, not specified as traumatic: Secondary | ICD-10-CM | POA: Diagnosis not present

## 2015-08-11 DIAGNOSIS — M25511 Pain in right shoulder: Secondary | ICD-10-CM | POA: Diagnosis not present

## 2015-08-11 DIAGNOSIS — M75121 Complete rotator cuff tear or rupture of right shoulder, not specified as traumatic: Secondary | ICD-10-CM | POA: Diagnosis not present

## 2015-08-16 DIAGNOSIS — M75121 Complete rotator cuff tear or rupture of right shoulder, not specified as traumatic: Secondary | ICD-10-CM | POA: Diagnosis not present

## 2015-08-16 DIAGNOSIS — M25511 Pain in right shoulder: Secondary | ICD-10-CM | POA: Diagnosis not present

## 2015-08-19 DIAGNOSIS — M75121 Complete rotator cuff tear or rupture of right shoulder, not specified as traumatic: Secondary | ICD-10-CM | POA: Diagnosis not present

## 2015-08-19 DIAGNOSIS — M25511 Pain in right shoulder: Secondary | ICD-10-CM | POA: Diagnosis not present

## 2015-08-23 DIAGNOSIS — M75121 Complete rotator cuff tear or rupture of right shoulder, not specified as traumatic: Secondary | ICD-10-CM | POA: Diagnosis not present

## 2015-08-23 DIAGNOSIS — M25511 Pain in right shoulder: Secondary | ICD-10-CM | POA: Diagnosis not present

## 2015-08-26 DIAGNOSIS — M75121 Complete rotator cuff tear or rupture of right shoulder, not specified as traumatic: Secondary | ICD-10-CM | POA: Diagnosis not present

## 2015-08-26 DIAGNOSIS — M25511 Pain in right shoulder: Secondary | ICD-10-CM | POA: Diagnosis not present

## 2015-08-29 DIAGNOSIS — M75121 Complete rotator cuff tear or rupture of right shoulder, not specified as traumatic: Secondary | ICD-10-CM | POA: Diagnosis not present

## 2015-08-29 DIAGNOSIS — R350 Frequency of micturition: Secondary | ICD-10-CM | POA: Diagnosis not present

## 2015-08-29 DIAGNOSIS — M25511 Pain in right shoulder: Secondary | ICD-10-CM | POA: Diagnosis not present

## 2015-08-29 DIAGNOSIS — Z0000 Encounter for general adult medical examination without abnormal findings: Secondary | ICD-10-CM | POA: Diagnosis not present

## 2015-08-29 DIAGNOSIS — R102 Pelvic and perineal pain: Secondary | ICD-10-CM | POA: Diagnosis not present

## 2015-08-31 DIAGNOSIS — C50312 Malignant neoplasm of lower-inner quadrant of left female breast: Secondary | ICD-10-CM | POA: Diagnosis not present

## 2015-08-31 DIAGNOSIS — R928 Other abnormal and inconclusive findings on diagnostic imaging of breast: Secondary | ICD-10-CM | POA: Diagnosis not present

## 2015-09-01 DIAGNOSIS — Z0001 Encounter for general adult medical examination with abnormal findings: Secondary | ICD-10-CM | POA: Diagnosis not present

## 2015-09-01 DIAGNOSIS — Z853 Personal history of malignant neoplasm of breast: Secondary | ICD-10-CM | POA: Diagnosis not present

## 2015-09-01 DIAGNOSIS — R102 Pelvic and perineal pain: Secondary | ICD-10-CM | POA: Diagnosis not present

## 2015-09-01 DIAGNOSIS — D649 Anemia, unspecified: Secondary | ICD-10-CM | POA: Diagnosis not present

## 2015-09-02 DIAGNOSIS — M25511 Pain in right shoulder: Secondary | ICD-10-CM | POA: Diagnosis not present

## 2015-09-02 DIAGNOSIS — Z9889 Other specified postprocedural states: Secondary | ICD-10-CM | POA: Diagnosis not present

## 2015-09-02 DIAGNOSIS — M75121 Complete rotator cuff tear or rupture of right shoulder, not specified as traumatic: Secondary | ICD-10-CM | POA: Diagnosis not present

## 2015-09-05 DIAGNOSIS — M75121 Complete rotator cuff tear or rupture of right shoulder, not specified as traumatic: Secondary | ICD-10-CM | POA: Diagnosis not present

## 2015-09-05 DIAGNOSIS — M25511 Pain in right shoulder: Secondary | ICD-10-CM | POA: Diagnosis not present

## 2015-09-08 DIAGNOSIS — M25511 Pain in right shoulder: Secondary | ICD-10-CM | POA: Diagnosis not present

## 2015-09-08 DIAGNOSIS — M75121 Complete rotator cuff tear or rupture of right shoulder, not specified as traumatic: Secondary | ICD-10-CM | POA: Diagnosis not present

## 2015-09-12 DIAGNOSIS — J329 Chronic sinusitis, unspecified: Secondary | ICD-10-CM | POA: Diagnosis not present

## 2015-09-12 DIAGNOSIS — M25511 Pain in right shoulder: Secondary | ICD-10-CM | POA: Diagnosis not present

## 2015-09-12 DIAGNOSIS — M75121 Complete rotator cuff tear or rupture of right shoulder, not specified as traumatic: Secondary | ICD-10-CM | POA: Diagnosis not present

## 2015-09-15 DIAGNOSIS — M25511 Pain in right shoulder: Secondary | ICD-10-CM | POA: Diagnosis not present

## 2015-09-15 DIAGNOSIS — M75121 Complete rotator cuff tear or rupture of right shoulder, not specified as traumatic: Secondary | ICD-10-CM | POA: Diagnosis not present

## 2015-09-20 DIAGNOSIS — M75121 Complete rotator cuff tear or rupture of right shoulder, not specified as traumatic: Secondary | ICD-10-CM | POA: Diagnosis not present

## 2015-09-20 DIAGNOSIS — M25511 Pain in right shoulder: Secondary | ICD-10-CM | POA: Diagnosis not present

## 2015-09-23 DIAGNOSIS — M25511 Pain in right shoulder: Secondary | ICD-10-CM | POA: Diagnosis not present

## 2015-09-23 DIAGNOSIS — M75121 Complete rotator cuff tear or rupture of right shoulder, not specified as traumatic: Secondary | ICD-10-CM | POA: Diagnosis not present

## 2015-09-27 DIAGNOSIS — M25511 Pain in right shoulder: Secondary | ICD-10-CM | POA: Diagnosis not present

## 2015-09-27 DIAGNOSIS — M75121 Complete rotator cuff tear or rupture of right shoulder, not specified as traumatic: Secondary | ICD-10-CM | POA: Diagnosis not present

## 2015-09-28 DIAGNOSIS — I350 Nonrheumatic aortic (valve) stenosis: Secondary | ICD-10-CM | POA: Diagnosis not present

## 2015-09-28 DIAGNOSIS — I493 Ventricular premature depolarization: Secondary | ICD-10-CM | POA: Diagnosis not present

## 2015-09-28 DIAGNOSIS — I10 Essential (primary) hypertension: Secondary | ICD-10-CM | POA: Diagnosis not present

## 2015-09-28 DIAGNOSIS — I351 Nonrheumatic aortic (valve) insufficiency: Secondary | ICD-10-CM | POA: Diagnosis not present

## 2015-09-30 DIAGNOSIS — M25511 Pain in right shoulder: Secondary | ICD-10-CM | POA: Diagnosis not present

## 2015-09-30 DIAGNOSIS — M75121 Complete rotator cuff tear or rupture of right shoulder, not specified as traumatic: Secondary | ICD-10-CM | POA: Diagnosis not present

## 2015-10-03 DIAGNOSIS — L728 Other follicular cysts of the skin and subcutaneous tissue: Secondary | ICD-10-CM | POA: Diagnosis not present

## 2015-10-03 DIAGNOSIS — L821 Other seborrheic keratosis: Secondary | ICD-10-CM | POA: Diagnosis not present

## 2015-10-04 DIAGNOSIS — M75121 Complete rotator cuff tear or rupture of right shoulder, not specified as traumatic: Secondary | ICD-10-CM | POA: Diagnosis not present

## 2015-10-07 DIAGNOSIS — M8589 Other specified disorders of bone density and structure, multiple sites: Secondary | ICD-10-CM | POA: Diagnosis not present

## 2015-10-07 DIAGNOSIS — M85832 Other specified disorders of bone density and structure, left forearm: Secondary | ICD-10-CM | POA: Diagnosis not present

## 2015-10-28 DIAGNOSIS — M75121 Complete rotator cuff tear or rupture of right shoulder, not specified as traumatic: Secondary | ICD-10-CM | POA: Diagnosis not present

## 2015-11-03 DIAGNOSIS — R350 Frequency of micturition: Secondary | ICD-10-CM | POA: Diagnosis not present

## 2015-11-03 DIAGNOSIS — Z0000 Encounter for general adult medical examination without abnormal findings: Secondary | ICD-10-CM | POA: Diagnosis not present

## 2015-11-03 DIAGNOSIS — R102 Pelvic and perineal pain: Secondary | ICD-10-CM | POA: Diagnosis not present

## 2015-11-17 DIAGNOSIS — Z9889 Other specified postprocedural states: Secondary | ICD-10-CM | POA: Diagnosis not present

## 2015-11-17 DIAGNOSIS — M75101 Unspecified rotator cuff tear or rupture of right shoulder, not specified as traumatic: Secondary | ICD-10-CM | POA: Diagnosis not present

## 2015-11-17 DIAGNOSIS — X58XXXA Exposure to other specified factors, initial encounter: Secondary | ICD-10-CM | POA: Diagnosis not present

## 2015-11-17 DIAGNOSIS — S46811A Strain of other muscles, fascia and tendons at shoulder and upper arm level, right arm, initial encounter: Secondary | ICD-10-CM | POA: Diagnosis not present

## 2015-11-18 DIAGNOSIS — M75121 Complete rotator cuff tear or rupture of right shoulder, not specified as traumatic: Secondary | ICD-10-CM | POA: Diagnosis not present

## 2015-12-07 DIAGNOSIS — K219 Gastro-esophageal reflux disease without esophagitis: Secondary | ICD-10-CM | POA: Diagnosis not present

## 2015-12-07 DIAGNOSIS — I10 Essential (primary) hypertension: Secondary | ICD-10-CM | POA: Diagnosis not present

## 2015-12-07 DIAGNOSIS — I350 Nonrheumatic aortic (valve) stenosis: Secondary | ICD-10-CM | POA: Diagnosis not present

## 2015-12-07 DIAGNOSIS — F321 Major depressive disorder, single episode, moderate: Secondary | ICD-10-CM | POA: Diagnosis not present

## 2016-01-17 DIAGNOSIS — T63441A Toxic effect of venom of bees, accidental (unintentional), initial encounter: Secondary | ICD-10-CM | POA: Diagnosis not present

## 2016-01-17 DIAGNOSIS — R609 Edema, unspecified: Secondary | ICD-10-CM | POA: Diagnosis not present

## 2016-02-23 DIAGNOSIS — Z79899 Other long term (current) drug therapy: Secondary | ICD-10-CM | POA: Diagnosis not present

## 2016-02-23 DIAGNOSIS — F329 Major depressive disorder, single episode, unspecified: Secondary | ICD-10-CM | POA: Diagnosis not present

## 2016-02-23 DIAGNOSIS — E785 Hyperlipidemia, unspecified: Secondary | ICD-10-CM | POA: Diagnosis not present

## 2016-02-23 DIAGNOSIS — K219 Gastro-esophageal reflux disease without esophagitis: Secondary | ICD-10-CM | POA: Diagnosis not present

## 2016-02-23 DIAGNOSIS — I10 Essential (primary) hypertension: Secondary | ICD-10-CM | POA: Diagnosis not present

## 2016-03-12 ENCOUNTER — Encounter: Payer: Self-pay | Admitting: Podiatry

## 2016-03-12 ENCOUNTER — Encounter (INDEPENDENT_AMBULATORY_CARE_PROVIDER_SITE_OTHER): Payer: Self-pay

## 2016-03-12 ENCOUNTER — Ambulatory Visit (INDEPENDENT_AMBULATORY_CARE_PROVIDER_SITE_OTHER): Payer: Medicare Other | Admitting: Podiatry

## 2016-03-12 VITALS — BP 118/63 | HR 78 | Resp 14

## 2016-03-12 DIAGNOSIS — L608 Other nail disorders: Secondary | ICD-10-CM

## 2016-03-12 DIAGNOSIS — M79676 Pain in unspecified toe(s): Secondary | ICD-10-CM | POA: Diagnosis not present

## 2016-03-12 DIAGNOSIS — B351 Tinea unguium: Secondary | ICD-10-CM

## 2016-03-12 NOTE — Progress Notes (Signed)
   Subjective:    Patient ID: Judith Solis, female    DOB: 1945/08/07, 70 y.o.   MRN: [id number]  HPI this patient presents the office with chief complaint of painful right big toenail. Patient states that his been painful for the last 3-4 weeks and is getting worse. She has pain walking and wearing her shoes. She denies any drainage at this site. She presents the office today for an evaluation and treatment of this nail    Review of Systems  All other systems reviewed and are negative.      Objective:   Physical Exam GENERAL APPEARANCE: Alert, conversant. Appropriately groomed. No acute distress.  VASCULAR: Pedal pulses are  palpable at  [facility] and PT bilateral.  Capillary refill time is immediate to all digits,  Normal temperature gradient.   NEUROLOGIC: sensation is normal to 5.07 monofilament at 5/5 sites bilateral.  Light touch is intact bilateral, Muscle strength normal.  MUSCULOSKELETAL: acceptable muscle strength, tone and stability bilateral.  Intrinsic muscluature intact bilateral.  HAV 1st MPJ  B/l  DERMATOLOGIC: skin color, texture, and turgor are within normal limits.  No preulcerative lesions or ulcers  are seen, no interdigital maceration noted.  No open lesions present.   No drainage noted.  NAILS  Thick pincer toenails B/L hallux         Assessment & Plan:  Onychomycosis  Pincer nails.  IE  Debride nails.  Discussed conservative vs. Surgical treatment.   RTC 3 months.   Gardiner Barefoot DPM

## 2016-03-28 DIAGNOSIS — Z23 Encounter for immunization: Secondary | ICD-10-CM | POA: Diagnosis not present

## 2016-03-28 DIAGNOSIS — Z0000 Encounter for general adult medical examination without abnormal findings: Secondary | ICD-10-CM | POA: Diagnosis not present

## 2016-04-03 DIAGNOSIS — C44329 Squamous cell carcinoma of skin of other parts of face: Secondary | ICD-10-CM | POA: Diagnosis not present

## 2016-04-25 DIAGNOSIS — I10 Essential (primary) hypertension: Secondary | ICD-10-CM | POA: Diagnosis not present

## 2016-04-25 DIAGNOSIS — I493 Ventricular premature depolarization: Secondary | ICD-10-CM | POA: Diagnosis not present

## 2016-04-25 DIAGNOSIS — I351 Nonrheumatic aortic (valve) insufficiency: Secondary | ICD-10-CM | POA: Diagnosis not present

## 2016-04-25 DIAGNOSIS — I350 Nonrheumatic aortic (valve) stenosis: Secondary | ICD-10-CM | POA: Diagnosis not present

## 2016-05-11 DIAGNOSIS — M67441 Ganglion, right hand: Secondary | ICD-10-CM | POA: Diagnosis not present

## 2016-05-11 DIAGNOSIS — M67432 Ganglion, left wrist: Secondary | ICD-10-CM | POA: Diagnosis not present

## 2016-05-24 DIAGNOSIS — M75121 Complete rotator cuff tear or rupture of right shoulder, not specified as traumatic: Secondary | ICD-10-CM | POA: Diagnosis not present

## 2016-05-29 DIAGNOSIS — H524 Presbyopia: Secondary | ICD-10-CM | POA: Diagnosis not present

## 2016-05-29 DIAGNOSIS — H26493 Other secondary cataract, bilateral: Secondary | ICD-10-CM | POA: Diagnosis not present

## 2016-06-18 ENCOUNTER — Ambulatory Visit: Payer: Medicare Other | Admitting: Podiatry

## 2016-07-02 ENCOUNTER — Ambulatory Visit (INDEPENDENT_AMBULATORY_CARE_PROVIDER_SITE_OTHER): Payer: Medicare Other | Admitting: Podiatry

## 2016-07-02 ENCOUNTER — Encounter: Payer: Self-pay | Admitting: Podiatry

## 2016-07-02 VITALS — Ht 65.5 in | Wt 178.0 lb

## 2016-07-02 DIAGNOSIS — L608 Other nail disorders: Secondary | ICD-10-CM

## 2016-07-02 DIAGNOSIS — B351 Tinea unguium: Secondary | ICD-10-CM

## 2016-07-02 DIAGNOSIS — M19072 Primary osteoarthritis, left ankle and foot: Secondary | ICD-10-CM | POA: Diagnosis not present

## 2016-07-02 DIAGNOSIS — M79676 Pain in unspecified toe(s): Secondary | ICD-10-CM

## 2016-07-02 NOTE — Progress Notes (Signed)
   Subjective:    Patient ID: Judith Solis, female    DOB: 03-28-1946, 70 y.o.   MRN: [id number]  HPI this patient presents the office with chief complaint of painful right big toenail. She has pain walking and wearing her shoes. She denies any drainage at this site. She presents the office today for an evaluation and treatment of this nail    Review of Systems  All other systems reviewed and are negative.      Objective:   Physical Exam GENERAL APPEARANCE: Alert, conversant. Appropriately groomed. No acute distress.  VASCULAR: Pedal pulses are  palpable at  [facility] and PT bilateral.  Capillary refill time is immediate to all digits,  Normal temperature gradient.   NEUROLOGIC: sensation is normal to 5.07 monofilament at 5/5 sites bilateral.  Light touch is intact bilateral, Muscle strength normal.  MUSCULOSKELETAL: acceptable muscle strength, tone and stability bilateral.  Intrinsic muscluature intact bilateral.  HAV 1st MPJ  B/l  DERMATOLOGIC: skin color, texture, and turgor are within normal limits.  No preulcerative lesions or ulcers  are seen, no interdigital maceration noted.  No open lesions present.   No drainage noted.  NAILS  Thick pincer toenails B/L hallux         Assessment & Plan:  Onychomycosis  Pincer nails.  IE  Debride nails.     RTC 3 months.   Gardiner Barefoot DPM

## 2016-07-03 DIAGNOSIS — H9193 Unspecified hearing loss, bilateral: Secondary | ICD-10-CM | POA: Diagnosis not present

## 2016-07-03 DIAGNOSIS — H6120 Impacted cerumen, unspecified ear: Secondary | ICD-10-CM | POA: Diagnosis not present

## 2016-07-10 DIAGNOSIS — H9193 Unspecified hearing loss, bilateral: Secondary | ICD-10-CM | POA: Diagnosis not present

## 2016-07-10 DIAGNOSIS — H903 Sensorineural hearing loss, bilateral: Secondary | ICD-10-CM | POA: Diagnosis not present

## 2016-07-30 DIAGNOSIS — J40 Bronchitis, not specified as acute or chronic: Secondary | ICD-10-CM | POA: Diagnosis not present

## 2016-07-30 DIAGNOSIS — Z0000 Encounter for general adult medical examination without abnormal findings: Secondary | ICD-10-CM | POA: Diagnosis not present

## 2016-08-13 DIAGNOSIS — M19172 Post-traumatic osteoarthritis, left ankle and foot: Secondary | ICD-10-CM | POA: Diagnosis not present

## 2016-08-31 DIAGNOSIS — Z1231 Encounter for screening mammogram for malignant neoplasm of breast: Secondary | ICD-10-CM | POA: Diagnosis not present

## 2016-09-04 DIAGNOSIS — M8580 Other specified disorders of bone density and structure, unspecified site: Secondary | ICD-10-CM | POA: Diagnosis not present

## 2016-09-04 DIAGNOSIS — Z853 Personal history of malignant neoplasm of breast: Secondary | ICD-10-CM | POA: Diagnosis not present

## 2016-09-12 DIAGNOSIS — I493 Ventricular premature depolarization: Secondary | ICD-10-CM | POA: Diagnosis not present

## 2016-09-12 DIAGNOSIS — I351 Nonrheumatic aortic (valve) insufficiency: Secondary | ICD-10-CM | POA: Diagnosis not present

## 2016-09-12 DIAGNOSIS — I10 Essential (primary) hypertension: Secondary | ICD-10-CM | POA: Diagnosis not present

## 2016-09-12 DIAGNOSIS — I350 Nonrheumatic aortic (valve) stenosis: Secondary | ICD-10-CM | POA: Diagnosis not present

## 2016-10-01 ENCOUNTER — Ambulatory Visit: Payer: Medicare Other | Admitting: Podiatry

## 2016-10-21 DIAGNOSIS — I493 Ventricular premature depolarization: Secondary | ICD-10-CM

## 2016-10-21 HISTORY — DX: Ventricular premature depolarization: I49.3

## 2016-10-22 DIAGNOSIS — I493 Ventricular premature depolarization: Secondary | ICD-10-CM | POA: Diagnosis not present

## 2016-10-22 DIAGNOSIS — I350 Nonrheumatic aortic (valve) stenosis: Secondary | ICD-10-CM | POA: Diagnosis not present

## 2016-10-22 DIAGNOSIS — I10 Essential (primary) hypertension: Secondary | ICD-10-CM | POA: Diagnosis not present

## 2016-10-22 DIAGNOSIS — E784 Other hyperlipidemia: Secondary | ICD-10-CM | POA: Diagnosis not present

## 2016-10-25 DIAGNOSIS — M75121 Complete rotator cuff tear or rupture of right shoulder, not specified as traumatic: Secondary | ICD-10-CM | POA: Diagnosis not present

## 2016-10-25 DIAGNOSIS — Z9889 Other specified postprocedural states: Secondary | ICD-10-CM | POA: Diagnosis not present

## 2016-11-27 DIAGNOSIS — E785 Hyperlipidemia, unspecified: Secondary | ICD-10-CM | POA: Diagnosis not present

## 2016-11-27 DIAGNOSIS — R252 Cramp and spasm: Secondary | ICD-10-CM | POA: Diagnosis not present

## 2016-11-27 DIAGNOSIS — F329 Major depressive disorder, single episode, unspecified: Secondary | ICD-10-CM | POA: Diagnosis not present

## 2016-11-27 DIAGNOSIS — Z79899 Other long term (current) drug therapy: Secondary | ICD-10-CM | POA: Diagnosis not present

## 2016-11-27 DIAGNOSIS — Z6832 Body mass index (BMI) 32.0-32.9, adult: Secondary | ICD-10-CM | POA: Diagnosis not present

## 2016-12-26 DIAGNOSIS — G8929 Other chronic pain: Secondary | ICD-10-CM | POA: Diagnosis not present

## 2016-12-26 DIAGNOSIS — M19172 Post-traumatic osteoarthritis, left ankle and foot: Secondary | ICD-10-CM | POA: Diagnosis not present

## 2016-12-26 DIAGNOSIS — M25572 Pain in left ankle and joints of left foot: Secondary | ICD-10-CM | POA: Diagnosis not present

## 2017-01-01 DIAGNOSIS — M25572 Pain in left ankle and joints of left foot: Secondary | ICD-10-CM | POA: Diagnosis not present

## 2017-01-01 DIAGNOSIS — M25672 Stiffness of left ankle, not elsewhere classified: Secondary | ICD-10-CM | POA: Diagnosis not present

## 2017-01-01 DIAGNOSIS — M6281 Muscle weakness (generalized): Secondary | ICD-10-CM | POA: Diagnosis not present

## 2017-01-04 DIAGNOSIS — M25672 Stiffness of left ankle, not elsewhere classified: Secondary | ICD-10-CM | POA: Diagnosis not present

## 2017-01-04 DIAGNOSIS — M6281 Muscle weakness (generalized): Secondary | ICD-10-CM | POA: Diagnosis not present

## 2017-01-04 DIAGNOSIS — M25572 Pain in left ankle and joints of left foot: Secondary | ICD-10-CM | POA: Diagnosis not present

## 2017-01-09 DIAGNOSIS — M25672 Stiffness of left ankle, not elsewhere classified: Secondary | ICD-10-CM | POA: Diagnosis not present

## 2017-01-09 DIAGNOSIS — M6281 Muscle weakness (generalized): Secondary | ICD-10-CM | POA: Diagnosis not present

## 2017-01-09 DIAGNOSIS — M25572 Pain in left ankle and joints of left foot: Secondary | ICD-10-CM | POA: Diagnosis not present

## 2017-01-11 DIAGNOSIS — M6281 Muscle weakness (generalized): Secondary | ICD-10-CM | POA: Diagnosis not present

## 2017-01-11 DIAGNOSIS — M25572 Pain in left ankle and joints of left foot: Secondary | ICD-10-CM | POA: Diagnosis not present

## 2017-01-11 DIAGNOSIS — M25672 Stiffness of left ankle, not elsewhere classified: Secondary | ICD-10-CM | POA: Diagnosis not present

## 2017-01-16 DIAGNOSIS — M25672 Stiffness of left ankle, not elsewhere classified: Secondary | ICD-10-CM | POA: Diagnosis not present

## 2017-01-16 DIAGNOSIS — M25572 Pain in left ankle and joints of left foot: Secondary | ICD-10-CM | POA: Diagnosis not present

## 2017-01-16 DIAGNOSIS — M6281 Muscle weakness (generalized): Secondary | ICD-10-CM | POA: Diagnosis not present

## 2017-01-18 DIAGNOSIS — M6281 Muscle weakness (generalized): Secondary | ICD-10-CM | POA: Diagnosis not present

## 2017-01-18 DIAGNOSIS — M25672 Stiffness of left ankle, not elsewhere classified: Secondary | ICD-10-CM | POA: Diagnosis not present

## 2017-01-18 DIAGNOSIS — M25572 Pain in left ankle and joints of left foot: Secondary | ICD-10-CM | POA: Diagnosis not present

## 2017-01-21 DIAGNOSIS — M6281 Muscle weakness (generalized): Secondary | ICD-10-CM | POA: Diagnosis not present

## 2017-01-21 DIAGNOSIS — M25672 Stiffness of left ankle, not elsewhere classified: Secondary | ICD-10-CM | POA: Diagnosis not present

## 2017-01-21 DIAGNOSIS — M25572 Pain in left ankle and joints of left foot: Secondary | ICD-10-CM | POA: Diagnosis not present

## 2017-01-25 DIAGNOSIS — M25572 Pain in left ankle and joints of left foot: Secondary | ICD-10-CM | POA: Diagnosis not present

## 2017-01-25 DIAGNOSIS — M6281 Muscle weakness (generalized): Secondary | ICD-10-CM | POA: Diagnosis not present

## 2017-01-25 DIAGNOSIS — M25672 Stiffness of left ankle, not elsewhere classified: Secondary | ICD-10-CM | POA: Diagnosis not present

## 2017-01-28 DIAGNOSIS — K219 Gastro-esophageal reflux disease without esophagitis: Secondary | ICD-10-CM | POA: Diagnosis not present

## 2017-01-28 DIAGNOSIS — R05 Cough: Secondary | ICD-10-CM | POA: Diagnosis not present

## 2017-01-30 DIAGNOSIS — M25672 Stiffness of left ankle, not elsewhere classified: Secondary | ICD-10-CM | POA: Diagnosis not present

## 2017-01-30 DIAGNOSIS — M6281 Muscle weakness (generalized): Secondary | ICD-10-CM | POA: Diagnosis not present

## 2017-01-30 DIAGNOSIS — M25572 Pain in left ankle and joints of left foot: Secondary | ICD-10-CM | POA: Diagnosis not present

## 2017-02-06 DIAGNOSIS — M25672 Stiffness of left ankle, not elsewhere classified: Secondary | ICD-10-CM | POA: Diagnosis not present

## 2017-02-06 DIAGNOSIS — M25572 Pain in left ankle and joints of left foot: Secondary | ICD-10-CM | POA: Diagnosis not present

## 2017-02-06 DIAGNOSIS — M6281 Muscle weakness (generalized): Secondary | ICD-10-CM | POA: Diagnosis not present

## 2017-02-07 DIAGNOSIS — R05 Cough: Secondary | ICD-10-CM | POA: Diagnosis not present

## 2017-02-07 DIAGNOSIS — R942 Abnormal results of pulmonary function studies: Secondary | ICD-10-CM | POA: Diagnosis not present

## 2017-02-12 DIAGNOSIS — J45909 Unspecified asthma, uncomplicated: Secondary | ICD-10-CM | POA: Diagnosis not present

## 2017-02-12 DIAGNOSIS — Z6832 Body mass index (BMI) 32.0-32.9, adult: Secondary | ICD-10-CM | POA: Diagnosis not present

## 2017-02-20 DIAGNOSIS — M25672 Stiffness of left ankle, not elsewhere classified: Secondary | ICD-10-CM | POA: Diagnosis not present

## 2017-02-20 DIAGNOSIS — M6281 Muscle weakness (generalized): Secondary | ICD-10-CM | POA: Diagnosis not present

## 2017-02-20 DIAGNOSIS — M25572 Pain in left ankle and joints of left foot: Secondary | ICD-10-CM | POA: Diagnosis not present

## 2017-03-15 DIAGNOSIS — Z6832 Body mass index (BMI) 32.0-32.9, adult: Secondary | ICD-10-CM | POA: Diagnosis not present

## 2017-03-15 DIAGNOSIS — R0982 Postnasal drip: Secondary | ICD-10-CM | POA: Diagnosis not present

## 2017-04-03 ENCOUNTER — Other Ambulatory Visit: Payer: Self-pay | Admitting: Cardiology

## 2017-04-03 NOTE — Telephone Encounter (Signed)
Left message for pt to return call to let us know if she will flup with Dr Bettina Gavia in [facility] or continue cardiac care in [place].

## 2017-04-04 NOTE — Telephone Encounter (Signed)
Left message for pt to return call to let us know if she will flup with Dr Bettina Gavia in [facility] or continue cardiac care in [place].

## 2017-04-05 DIAGNOSIS — I499 Cardiac arrhythmia, unspecified: Secondary | ICD-10-CM | POA: Insufficient documentation

## 2017-04-05 DIAGNOSIS — I350 Nonrheumatic aortic (valve) stenosis: Secondary | ICD-10-CM | POA: Insufficient documentation

## 2017-04-05 DIAGNOSIS — I10 Essential (primary) hypertension: Secondary | ICD-10-CM

## 2017-04-05 DIAGNOSIS — I519 Heart disease, unspecified: Secondary | ICD-10-CM | POA: Insufficient documentation

## 2017-04-05 DIAGNOSIS — E785 Hyperlipidemia, unspecified: Secondary | ICD-10-CM

## 2017-04-05 DIAGNOSIS — I351 Nonrheumatic aortic (valve) insufficiency: Secondary | ICD-10-CM

## 2017-04-05 HISTORY — DX: Nonrheumatic aortic (valve) insufficiency: I35.1

## 2017-04-05 HISTORY — DX: Essential (primary) hypertension: I10

## 2017-04-05 HISTORY — DX: Hyperlipidemia, unspecified: E78.5

## 2017-04-08 DIAGNOSIS — Z23 Encounter for immunization: Secondary | ICD-10-CM | POA: Diagnosis not present

## 2017-05-09 NOTE — Progress Notes (Signed)
Cardiology Office Note:    Date:  05/09/2017   ID:  Judith Solis, DOB 1946-01-10, MRN [id number]  PCP:  Ronita Hipps, MD  Cardiologist:  Shirlee More, MD    Referring MD: Ronita Hipps, MD    ASSESSMENT:    1. Nonrheumatic aortic valve stenosis   2. Nonrheumatic aortic valve insufficiency   3. Essential hypertension    PLAN:    In order of problems listed above:  Asymptomatic however by physical examination concern does become severe and will recheck echocardiogram. If she develops symptoms and has severe aortic stenosis she be a good candidate her for TAVR. She will continue endocarditis prophylaxis with amoxicillin presently Stable clinically Stable blood pressure continue current treatment  Next appointment: 6 months   Medication Adjustments/Labs and Tests Ordered: Current medicines are reviewed at length with the patient today.  Concerns regarding medicines are outlined above.  No orders of the defined types were placed in this encounter.  No orders of the defined types were placed in this encounter.   No chief complaint on file.   History of Present Illness:    Judith Solis is a 71 y.o. female with a hx of aortic stenosis last seen 6 months ago.. Compliance with diet, lifestyle and medications: yes She is limited by ankle pain but does heavy house work shopping and has had no chest pain shortness of breath syncope fever or chills. She has had bilateral total hip arthroplasty and follows endocarditis prophylaxis at present Past Medical History:  Diagnosis Date  . Aortic stenosis 04/05/2017  . Aortic valve regurgitation 04/05/2017  . Arrhythmia 04/05/2017  . Hyperlipidemia 04/05/2017  . Hypertension 04/05/2017    Past Surgical History:  Procedure Laterality Date  . ABDOMINAL HYSTERECTOMY    . CATARACT EXTRACTION    . ROTATOR CUFF REPAIR      Current Medications: No outpatient prescriptions have been marked as taking for the 05/10/17 encounter  (Appointment) with Richardo Priest, MD.     Allergies:   Oxycodone-acetaminophen and Codeine   Social History   Social History  . Marital status: Married    Spouse name: N/A  . Number of children: N/A  . Years of education: N/A   Social History Main Topics  . Smoking status: Former Research scientist (life sciences)  . Smokeless tobacco: Never Used  . Alcohol use No  . Drug use: No  . Sexual activity: Not on file   Other Topics Concern  . Not on file   Social History Narrative  . No narrative on file     Family History: The patient's family history is not on file. ROS:   Please see the history of present illness.    All other systems reviewed and are negative.  EKGs/Labs/Other Studies Reviewed:    The following studies were reviewed today:  EKG:  EKG ordered today.  The ekg ordered today demonstrates sinus rhythm no ischemic changes  Recent Labs: requested from her PCP No results found for requested labs within last 8760 hours.  Recent Lipid Panel No results found for: CHOL, TRIG, HDL, CHOLHDL, VLDL, LDLCALC, LDLDIRECT  Physical Exam:    VS:  There were no vitals taken for this visit.    Wt Readings from Last 3 Encounters:  07/02/16 178 lb (80.7 kg)     GEN:  Well nourished, well developed in no acute distress HEENT: Normal NECK: No JVD; No carotid bruits LYMPHATICS: No lymphadenopathy CARDIAC: 3/6 harsh late peak S2 to left carotid, s2 is singleRRR,  no murmurs, rubs, gallops RESPIRATORY:  Clear to auscultation without rales, wheezing or rhonchi  ABDOMEN: Soft, non-tender, non-distended MUSCULOSKELETAL:  No edema; No deformity  SKIN: Warm and dry NEUROLOGIC:  Alert and oriented x 3 PSYCHIATRIC:  Normal affect    Signed, Shirlee More, MD  05/09/2017 7:53 AM    [place]

## 2017-05-10 ENCOUNTER — Ambulatory Visit (INDEPENDENT_AMBULATORY_CARE_PROVIDER_SITE_OTHER): Payer: Medicare Other | Admitting: Cardiology

## 2017-05-10 ENCOUNTER — Encounter: Payer: Self-pay | Admitting: Cardiology

## 2017-05-10 VITALS — BP 130/76 | HR 70 | Ht 65.5 in | Wt 193.0 lb

## 2017-05-10 DIAGNOSIS — E785 Hyperlipidemia, unspecified: Secondary | ICD-10-CM

## 2017-05-10 DIAGNOSIS — I350 Nonrheumatic aortic (valve) stenosis: Secondary | ICD-10-CM | POA: Diagnosis not present

## 2017-05-10 DIAGNOSIS — I10 Essential (primary) hypertension: Secondary | ICD-10-CM | POA: Diagnosis not present

## 2017-05-10 DIAGNOSIS — I351 Nonrheumatic aortic (valve) insufficiency: Secondary | ICD-10-CM | POA: Diagnosis not present

## 2017-05-10 NOTE — Patient Instructions (Signed)
Medication Instructions:  Your physician recommends that you continue on your current medications as directed. Please refer to the Current Medication list given to you today.  Labwork: None  Testing/Procedures: You had an EKG today.  Your physician has requested that you have an echocardiogram. Echocardiography is a painless test that uses sound waves to create images of your heart. It provides your doctor with information about the size and shape of your heart and how well your heart's chambers and valves are working. This procedure takes approximately one hour. There are no restrictions for this procedure.  Follow-Up: Your physician wants you to follow-up in: 6 months. You will receive a reminder letter in the mail two months in advance. If you don't receive a letter, please call our office to schedule the follow-up appointment.  Any Other Special Instructions Will Be Listed Below (If Applicable).     If you need a refill on your cardiac medications before your next appointment, please call your pharmacy.

## 2017-05-28 ENCOUNTER — Ambulatory Visit (HOSPITAL_BASED_OUTPATIENT_CLINIC_OR_DEPARTMENT_OTHER)
Admission: RE | Admit: 2017-05-28 | Discharge: 2017-05-28 | Disposition: A | Discharge status: Home / Self Care | Payer: Medicare Other | Source: Ambulatory Visit | Attending: Cardiology | Admitting: Cardiology

## 2017-05-28 DIAGNOSIS — Z87891 Personal history of nicotine dependence: Secondary | ICD-10-CM | POA: Insufficient documentation

## 2017-05-28 DIAGNOSIS — I10 Essential (primary) hypertension: Secondary | ICD-10-CM | POA: Diagnosis not present

## 2017-05-28 DIAGNOSIS — I080 Rheumatic disorders of both mitral and aortic valves: Secondary | ICD-10-CM | POA: Diagnosis not present

## 2017-05-28 DIAGNOSIS — I350 Nonrheumatic aortic (valve) stenosis: Secondary | ICD-10-CM | POA: Diagnosis not present

## 2017-05-28 DIAGNOSIS — I351 Nonrheumatic aortic (valve) insufficiency: Secondary | ICD-10-CM | POA: Diagnosis not present

## 2017-05-28 LAB — ECHOCARDIOGRAM COMPLETE
AO mean calculated velocity dopler: 264 cm/s
AOVTI: 82.3 cm
AV Area VTI index: 0.46 cm2/m2
AV Area mean vel: 0.87 cm2
AV area mean vel ind: 0.44 cm2/m2
AV peak Index: 0.48
AV vel: 0.9
AVA: 0.9 cm2
AVAREAVTI: 0.95 cm2
AVCELMEANRAT: 0.25
AVG: 32 mmHg
AVPG: 54 mmHg
AVPKVEL: 367 cm/s
Ao pk vel: 0.28 m/s
CHL CUP AV VALUE AREA INDEX: 0.46
CHL CUP STROKE VOLUME: 69 mL
E decel time: 257 msec
EERAT: 10.45
FS: 29 % (ref 28–44)
IVS/LV PW RATIO, ED: 1.08
LA ID, A-P, ES: 45 mm
LA diam end sys: 45 mm
LA vol A4C: 76.9 ml
LADIAMINDEX: 2.3 cm/m2
LAVOL: 86.2 mL
LAVOLIN: 44 mL/m2
LV E/e' medial: 10.45
LV PW d: 11.9 mm — AB (ref 0.6–1.1)
LV SIMPSON'S DISK: 56
LV TDI E'LATERAL: 6.64
LV dias vol index: 64 mL/m2
LV e' LATERAL: 6.64 cm/s
LV sys vol index: 28 mL/m2
LV sys vol: 56 mL — AB (ref 14–42)
LVDIAVOL: 125 mL — AB (ref 46–106)
LVEEAVG: 10.45
LVOT SV: 74 mL
LVOT VTI: 21.3 cm
LVOT area: 3.46 cm2
LVOT diameter: 21 mm
LVOT peak VTI: 0.26 cm
LVOT peak vel: 101 cm/s
Lateral S' vel: 11.5 cm/s
MV Dec: 257
MV pk A vel: 83.6 m/s
MV pk E vel: 69.4 m/s
P 1/2 time: 529 ms
Reg peak vel: 254 cm/s
TAPSE: 21.7 mm
TDI e' medial: 5.44
TR max vel: 254 cm/s

## 2017-05-28 NOTE — Progress Notes (Signed)
  Echocardiogram 2D Echocardiogram has been performed.  Judith Solis 05/28/2017, 12:16 PM

## 2017-06-11 DIAGNOSIS — Z6832 Body mass index (BMI) 32.0-32.9, adult: Secondary | ICD-10-CM | POA: Diagnosis not present

## 2017-06-11 DIAGNOSIS — M62838 Other muscle spasm: Secondary | ICD-10-CM | POA: Diagnosis not present

## 2017-07-02 DIAGNOSIS — A6004 Herpesviral vulvovaginitis: Secondary | ICD-10-CM | POA: Diagnosis not present

## 2017-07-02 DIAGNOSIS — B079 Viral wart, unspecified: Secondary | ICD-10-CM | POA: Diagnosis not present

## 2017-07-09 DIAGNOSIS — H26493 Other secondary cataract, bilateral: Secondary | ICD-10-CM | POA: Diagnosis not present

## 2017-07-09 DIAGNOSIS — H524 Presbyopia: Secondary | ICD-10-CM | POA: Diagnosis not present

## 2017-07-10 DIAGNOSIS — R2231 Localized swelling, mass and lump, right upper limb: Secondary | ICD-10-CM | POA: Diagnosis not present

## 2017-07-12 DIAGNOSIS — M62838 Other muscle spasm: Secondary | ICD-10-CM | POA: Diagnosis not present

## 2017-07-12 DIAGNOSIS — I10 Essential (primary) hypertension: Secondary | ICD-10-CM | POA: Diagnosis not present

## 2017-07-12 DIAGNOSIS — G4700 Insomnia, unspecified: Secondary | ICD-10-CM | POA: Diagnosis not present

## 2017-07-12 DIAGNOSIS — M542 Cervicalgia: Secondary | ICD-10-CM | POA: Diagnosis not present

## 2017-07-18 DIAGNOSIS — R2231 Localized swelling, mass and lump, right upper limb: Secondary | ICD-10-CM | POA: Diagnosis not present

## 2017-07-18 DIAGNOSIS — M19041 Primary osteoarthritis, right hand: Secondary | ICD-10-CM | POA: Diagnosis not present

## 2017-07-25 DIAGNOSIS — R2231 Localized swelling, mass and lump, right upper limb: Secondary | ICD-10-CM | POA: Diagnosis not present

## 2017-07-31 DIAGNOSIS — M542 Cervicalgia: Secondary | ICD-10-CM | POA: Diagnosis not present

## 2017-07-31 DIAGNOSIS — M47812 Spondylosis without myelopathy or radiculopathy, cervical region: Secondary | ICD-10-CM | POA: Diagnosis not present

## 2017-08-08 DIAGNOSIS — M542 Cervicalgia: Secondary | ICD-10-CM | POA: Diagnosis not present

## 2017-08-08 DIAGNOSIS — M6281 Muscle weakness (generalized): Secondary | ICD-10-CM | POA: Diagnosis not present

## 2017-08-12 DIAGNOSIS — M542 Cervicalgia: Secondary | ICD-10-CM | POA: Diagnosis not present

## 2017-08-12 DIAGNOSIS — M6281 Muscle weakness (generalized): Secondary | ICD-10-CM | POA: Diagnosis not present

## 2017-08-13 DIAGNOSIS — L299 Pruritus, unspecified: Secondary | ICD-10-CM | POA: Diagnosis not present

## 2017-08-13 DIAGNOSIS — L209 Atopic dermatitis, unspecified: Secondary | ICD-10-CM | POA: Diagnosis not present

## 2017-08-13 DIAGNOSIS — L900 Lichen sclerosus et atrophicus: Secondary | ICD-10-CM | POA: Diagnosis not present

## 2017-08-14 DIAGNOSIS — M542 Cervicalgia: Secondary | ICD-10-CM | POA: Diagnosis not present

## 2017-08-14 DIAGNOSIS — M6281 Muscle weakness (generalized): Secondary | ICD-10-CM | POA: Diagnosis not present

## 2017-08-15 DIAGNOSIS — F418 Other specified anxiety disorders: Secondary | ICD-10-CM | POA: Diagnosis not present

## 2017-08-15 DIAGNOSIS — J45909 Unspecified asthma, uncomplicated: Secondary | ICD-10-CM | POA: Diagnosis not present

## 2017-08-15 DIAGNOSIS — M65841 Other synovitis and tenosynovitis, right hand: Secondary | ICD-10-CM | POA: Diagnosis not present

## 2017-08-15 DIAGNOSIS — Z853 Personal history of malignant neoplasm of breast: Secondary | ICD-10-CM | POA: Diagnosis not present

## 2017-08-15 DIAGNOSIS — I10 Essential (primary) hypertension: Secondary | ICD-10-CM | POA: Diagnosis not present

## 2017-08-15 DIAGNOSIS — R2231 Localized swelling, mass and lump, right upper limb: Secondary | ICD-10-CM | POA: Diagnosis not present

## 2017-08-15 DIAGNOSIS — Z7982 Long term (current) use of aspirin: Secondary | ICD-10-CM | POA: Diagnosis not present

## 2017-08-15 DIAGNOSIS — M1990 Unspecified osteoarthritis, unspecified site: Secondary | ICD-10-CM | POA: Diagnosis not present

## 2017-08-15 DIAGNOSIS — Z79899 Other long term (current) drug therapy: Secondary | ICD-10-CM | POA: Diagnosis not present

## 2017-08-15 DIAGNOSIS — M24041 Loose body in right finger joint(s): Secondary | ICD-10-CM | POA: Diagnosis not present

## 2017-08-15 DIAGNOSIS — I352 Nonrheumatic aortic (valve) stenosis with insufficiency: Secondary | ICD-10-CM | POA: Diagnosis not present

## 2017-08-15 DIAGNOSIS — E785 Hyperlipidemia, unspecified: Secondary | ICD-10-CM | POA: Diagnosis not present

## 2017-08-15 DIAGNOSIS — K219 Gastro-esophageal reflux disease without esophagitis: Secondary | ICD-10-CM | POA: Diagnosis not present

## 2017-08-15 DIAGNOSIS — Z87891 Personal history of nicotine dependence: Secondary | ICD-10-CM | POA: Diagnosis not present

## 2017-08-21 DIAGNOSIS — M6281 Muscle weakness (generalized): Secondary | ICD-10-CM | POA: Diagnosis not present

## 2017-08-21 DIAGNOSIS — M25641 Stiffness of right hand, not elsewhere classified: Secondary | ICD-10-CM | POA: Diagnosis not present

## 2017-08-21 DIAGNOSIS — M79641 Pain in right hand: Secondary | ICD-10-CM | POA: Diagnosis not present

## 2017-08-22 DIAGNOSIS — M25641 Stiffness of right hand, not elsewhere classified: Secondary | ICD-10-CM | POA: Diagnosis not present

## 2017-08-22 DIAGNOSIS — M6281 Muscle weakness (generalized): Secondary | ICD-10-CM | POA: Diagnosis not present

## 2017-08-22 DIAGNOSIS — M79641 Pain in right hand: Secondary | ICD-10-CM | POA: Diagnosis not present

## 2017-08-26 ENCOUNTER — Other Ambulatory Visit: Payer: Self-pay | Admitting: Cardiology

## 2017-08-26 DIAGNOSIS — M25641 Stiffness of right hand, not elsewhere classified: Secondary | ICD-10-CM | POA: Diagnosis not present

## 2017-08-26 DIAGNOSIS — M79641 Pain in right hand: Secondary | ICD-10-CM | POA: Diagnosis not present

## 2017-08-26 DIAGNOSIS — M6281 Muscle weakness (generalized): Secondary | ICD-10-CM | POA: Diagnosis not present

## 2017-08-29 ENCOUNTER — Ambulatory Visit: Payer: Medicare Other | Admitting: Sports Medicine

## 2017-08-29 DIAGNOSIS — M47812 Spondylosis without myelopathy or radiculopathy, cervical region: Secondary | ICD-10-CM

## 2017-08-29 DIAGNOSIS — M542 Cervicalgia: Secondary | ICD-10-CM | POA: Insufficient documentation

## 2017-08-29 DIAGNOSIS — M25641 Stiffness of right hand, not elsewhere classified: Secondary | ICD-10-CM | POA: Diagnosis not present

## 2017-08-29 DIAGNOSIS — M6281 Muscle weakness (generalized): Secondary | ICD-10-CM | POA: Diagnosis not present

## 2017-08-29 DIAGNOSIS — M79641 Pain in right hand: Secondary | ICD-10-CM | POA: Diagnosis not present

## 2017-08-29 HISTORY — DX: Spondylosis without myelopathy or radiculopathy, cervical region: M47.812

## 2017-08-30 ENCOUNTER — Encounter: Payer: Self-pay | Admitting: Sports Medicine

## 2017-08-30 ENCOUNTER — Ambulatory Visit (INDEPENDENT_AMBULATORY_CARE_PROVIDER_SITE_OTHER): Payer: Medicare Other | Admitting: Sports Medicine

## 2017-08-30 DIAGNOSIS — B351 Tinea unguium: Secondary | ICD-10-CM

## 2017-08-30 DIAGNOSIS — M79676 Pain in unspecified toe(s): Secondary | ICD-10-CM

## 2017-08-30 DIAGNOSIS — L608 Other nail disorders: Secondary | ICD-10-CM

## 2017-08-30 NOTE — Progress Notes (Addendum)
Subjective: Judith Solis is a 72 y.o. female patient seen today in office with complaint of mildly painful thickened and elongated toenails; unable to trim. Patient denies history of Diabetes, Neuropathy, or Vascular disease. Patient her nails states that sometimes on the left big toenail ingrowth into the skin states that she has gotten pedicures in the past and wants to have a reevaluation of her nails for any additional treatment for her thick nails.  Patient has no other pedal complaints at this time.   Patient Active Problem List   Diagnosis Date Noted  . Aortic stenosis 04/05/2017  . Aortic valve regurgitation 04/05/2017  . Arrhythmia 04/05/2017  . Hyperlipidemia 04/05/2017  . Essential hypertension 04/05/2017    Current Outpatient Medications on File Prior to Visit  Medication Sig Dispense Refill  . aspirin EC 81 MG tablet Take 81 mg by mouth.    Marland Kitchen atorvastatin (LIPITOR) 10 MG tablet     . Calcium Carb-Ergocalciferol 500-200 MG-UNIT TABS Take by mouth.    . citalopram (CELEXA) 40 MG tablet daily.    . clobetasol cream (TEMOVATE) 0.05 %     . losartan-hydrochlorothiazide (HYZAAR) 50-12.5 MG tablet TAKE 1 TABLET EVERY DAY 90 tablet 1  . Multiple Vitamin (MULTI-VITAMINS) TABS Take by mouth.    . pantoprazole (PROTONIX) 40 MG tablet Take 40 mg by mouth daily.    . pramipexole (MIRAPEX) 0.125 MG tablet pramipexole 0.125 mg tablet    . tiZANidine (ZANAFLEX) 4 MG tablet     . traZODone (DESYREL) 50 MG tablet     . B Complex Vitamins (VITAMIN-B COMPLEX) TABS Take by mouth.    . docusate sodium (COLACE) 100 MG capsule Take 100 mg by mouth 2 (two) times daily.    . VENTOLIN HFA 108 (90 Base) MCG/ACT inhaler      No current facility-administered medications on file prior to visit.     Allergies  Allergen Reactions  . Oxycodone-Acetaminophen   . Codeine Nausea And Vomiting    Objective: Physical Exam  General: Well developed, nourished, no acute distress, awake, alert and  oriented x 3  Vascular: Dorsalis pedis artery 1/4 bilateral, Posterior tibial artery 1/4 bilateral, skin temperature warm to warm proximal to distal bilateral lower extremities, moderate varicosities, no pedal hair present bilateral.  Neurological: Gross sensation present via light touch bilateral.   Dermatological: Skin is warm, dry, and supple bilateral, Nails 1-10 are tender, long with the most length on bilateral first and fourth toenails, thick, and discolored with mild subungal debris, no webspace macerations present bilateral, no open lesions present bilateral, no callus/corns/hyperkeratotic tissue present bilateral. No signs of infection bilateral.  Musculoskeletal: Asymptomatic bunion boney deformities noted bilateral. Muscular strength within normal limits without painon range of motion. No pain with calf compression bilateral.  Assessment and Plan:  Problem List Items Addressed This Visit    None    Visit Diagnoses    Pain due to onychomycosis of toenail    -  Primary   Pincer nail deformity         -Examined patient.  -Discussed treatment options for painful mycotic nails. -Mechanically debrided and reduced mycotic nails with sterile nail nipper and dremel nail file without incident. -ABN signed -Patient to return in 3 months for follow up evaluation or sooner if symptoms worsen.  [name], DPM

## 2017-09-02 DIAGNOSIS — M25641 Stiffness of right hand, not elsewhere classified: Secondary | ICD-10-CM | POA: Diagnosis not present

## 2017-09-02 DIAGNOSIS — Z1231 Encounter for screening mammogram for malignant neoplasm of breast: Secondary | ICD-10-CM | POA: Diagnosis not present

## 2017-09-02 DIAGNOSIS — M6281 Muscle weakness (generalized): Secondary | ICD-10-CM | POA: Diagnosis not present

## 2017-09-02 DIAGNOSIS — M79641 Pain in right hand: Secondary | ICD-10-CM | POA: Diagnosis not present

## 2017-09-04 DIAGNOSIS — M79641 Pain in right hand: Secondary | ICD-10-CM | POA: Diagnosis not present

## 2017-09-04 DIAGNOSIS — M25641 Stiffness of right hand, not elsewhere classified: Secondary | ICD-10-CM | POA: Diagnosis not present

## 2017-09-04 DIAGNOSIS — M6281 Muscle weakness (generalized): Secondary | ICD-10-CM | POA: Diagnosis not present

## 2017-09-06 DIAGNOSIS — Z853 Personal history of malignant neoplasm of breast: Secondary | ICD-10-CM | POA: Diagnosis not present

## 2017-09-09 DIAGNOSIS — M25641 Stiffness of right hand, not elsewhere classified: Secondary | ICD-10-CM | POA: Diagnosis not present

## 2017-09-09 DIAGNOSIS — M6281 Muscle weakness (generalized): Secondary | ICD-10-CM | POA: Diagnosis not present

## 2017-09-09 DIAGNOSIS — M79641 Pain in right hand: Secondary | ICD-10-CM | POA: Diagnosis not present

## 2017-09-12 DIAGNOSIS — M5030 Other cervical disc degeneration, unspecified cervical region: Secondary | ICD-10-CM

## 2017-09-12 DIAGNOSIS — M47812 Spondylosis without myelopathy or radiculopathy, cervical region: Secondary | ICD-10-CM | POA: Diagnosis not present

## 2017-09-12 DIAGNOSIS — M79641 Pain in right hand: Secondary | ICD-10-CM | POA: Diagnosis not present

## 2017-09-12 DIAGNOSIS — M6281 Muscle weakness (generalized): Secondary | ICD-10-CM | POA: Diagnosis not present

## 2017-09-12 DIAGNOSIS — M25641 Stiffness of right hand, not elsewhere classified: Secondary | ICD-10-CM | POA: Diagnosis not present

## 2017-09-12 HISTORY — DX: Other cervical disc degeneration, unspecified cervical region: M50.30

## 2017-09-16 DIAGNOSIS — M6281 Muscle weakness (generalized): Secondary | ICD-10-CM | POA: Diagnosis not present

## 2017-09-16 DIAGNOSIS — M25641 Stiffness of right hand, not elsewhere classified: Secondary | ICD-10-CM | POA: Diagnosis not present

## 2017-09-16 DIAGNOSIS — M542 Cervicalgia: Secondary | ICD-10-CM | POA: Diagnosis not present

## 2017-09-16 DIAGNOSIS — M79641 Pain in right hand: Secondary | ICD-10-CM | POA: Diagnosis not present

## 2017-09-17 DIAGNOSIS — L900 Lichen sclerosus et atrophicus: Secondary | ICD-10-CM | POA: Diagnosis not present

## 2017-09-17 DIAGNOSIS — B079 Viral wart, unspecified: Secondary | ICD-10-CM | POA: Diagnosis not present

## 2017-09-24 DIAGNOSIS — M25641 Stiffness of right hand, not elsewhere classified: Secondary | ICD-10-CM | POA: Diagnosis not present

## 2017-09-24 DIAGNOSIS — M6281 Muscle weakness (generalized): Secondary | ICD-10-CM | POA: Diagnosis not present

## 2017-09-24 DIAGNOSIS — M79641 Pain in right hand: Secondary | ICD-10-CM | POA: Diagnosis not present

## 2017-09-27 DIAGNOSIS — M6281 Muscle weakness (generalized): Secondary | ICD-10-CM | POA: Diagnosis not present

## 2017-09-27 DIAGNOSIS — M25641 Stiffness of right hand, not elsewhere classified: Secondary | ICD-10-CM | POA: Diagnosis not present

## 2017-09-27 DIAGNOSIS — M79641 Pain in right hand: Secondary | ICD-10-CM | POA: Diagnosis not present

## 2017-10-07 DIAGNOSIS — M79641 Pain in right hand: Secondary | ICD-10-CM | POA: Diagnosis not present

## 2017-10-07 DIAGNOSIS — M25641 Stiffness of right hand, not elsewhere classified: Secondary | ICD-10-CM | POA: Diagnosis not present

## 2017-10-07 DIAGNOSIS — M6281 Muscle weakness (generalized): Secondary | ICD-10-CM | POA: Diagnosis not present

## 2017-10-09 DIAGNOSIS — M47812 Spondylosis without myelopathy or radiculopathy, cervical region: Secondary | ICD-10-CM | POA: Diagnosis not present

## 2017-10-09 DIAGNOSIS — M5030 Other cervical disc degeneration, unspecified cervical region: Secondary | ICD-10-CM | POA: Diagnosis not present

## 2017-10-10 DIAGNOSIS — M25641 Stiffness of right hand, not elsewhere classified: Secondary | ICD-10-CM | POA: Diagnosis not present

## 2017-10-10 DIAGNOSIS — M6281 Muscle weakness (generalized): Secondary | ICD-10-CM | POA: Diagnosis not present

## 2017-10-10 DIAGNOSIS — M79641 Pain in right hand: Secondary | ICD-10-CM | POA: Diagnosis not present

## 2017-10-14 DIAGNOSIS — M25641 Stiffness of right hand, not elsewhere classified: Secondary | ICD-10-CM | POA: Diagnosis not present

## 2017-10-14 DIAGNOSIS — M79641 Pain in right hand: Secondary | ICD-10-CM | POA: Diagnosis not present

## 2017-10-14 DIAGNOSIS — M6281 Muscle weakness (generalized): Secondary | ICD-10-CM | POA: Diagnosis not present

## 2017-10-21 DIAGNOSIS — M6281 Muscle weakness (generalized): Secondary | ICD-10-CM | POA: Diagnosis not present

## 2017-10-21 DIAGNOSIS — M79641 Pain in right hand: Secondary | ICD-10-CM | POA: Diagnosis not present

## 2017-10-21 DIAGNOSIS — M25641 Stiffness of right hand, not elsewhere classified: Secondary | ICD-10-CM | POA: Diagnosis not present

## 2017-10-24 DIAGNOSIS — M47812 Spondylosis without myelopathy or radiculopathy, cervical region: Secondary | ICD-10-CM | POA: Diagnosis not present

## 2017-10-30 DIAGNOSIS — M79641 Pain in right hand: Secondary | ICD-10-CM | POA: Diagnosis not present

## 2017-10-30 DIAGNOSIS — M6281 Muscle weakness (generalized): Secondary | ICD-10-CM | POA: Diagnosis not present

## 2017-10-30 DIAGNOSIS — M25641 Stiffness of right hand, not elsewhere classified: Secondary | ICD-10-CM | POA: Diagnosis not present

## 2017-11-06 ENCOUNTER — Ambulatory Visit (INDEPENDENT_AMBULATORY_CARE_PROVIDER_SITE_OTHER): Payer: Medicare Other | Admitting: Cardiology

## 2017-11-06 ENCOUNTER — Encounter: Payer: Self-pay | Admitting: Cardiology

## 2017-11-06 VITALS — BP 134/78 | HR 86 | Ht 65.5 in | Wt 208.0 lb

## 2017-11-06 DIAGNOSIS — I493 Ventricular premature depolarization: Secondary | ICD-10-CM

## 2017-11-06 DIAGNOSIS — E785 Hyperlipidemia, unspecified: Secondary | ICD-10-CM | POA: Diagnosis not present

## 2017-11-06 DIAGNOSIS — I350 Nonrheumatic aortic (valve) stenosis: Secondary | ICD-10-CM | POA: Diagnosis not present

## 2017-11-06 DIAGNOSIS — I10 Essential (primary) hypertension: Secondary | ICD-10-CM | POA: Diagnosis not present

## 2017-11-06 NOTE — Patient Instructions (Addendum)
Medication Instructions:  Your physician recommends that you continue on your current medications as directed. Please refer to the Current Medication list given to you today.  Labwork: None  Testing/Procedures: Your physician has requested that you have an echocardiogram. Echocardiography is a painless test that uses sound waves to create images of your heart. It provides your doctor with information about the size and shape of your heart and how well your heart's chambers and valves are working. This procedure takes approximately one hour. There are no restrictions for this procedure. This will be done is [place] in 6 months. You will receive a phone call or a letter in the mail to schedule.  Follow-Up: Your physician wants you to follow-up in: 6 months. You will receive a reminder letter in the mail two months in advance. If you don't receive a letter, please call our office to schedule the follow-up appointment.  Any Other Special Instructions Will Be Listed Below (If Applicable).     If you need a refill on your cardiac medications before your next appointment, please call your pharmacy.    Aortic Valve Stenosis Aortic valve stenosis is a narrowing of the aortic valve. The aortic valve opens and closes to regulate blood flow between the lower left chamber of the heart (left ventricle) and the blood vessel that leads away from the heart (aorta). When the aortic valve becomes narrow, it makes it difficult for the heart to pump blood into the aorta, which causes the heart to work harder. The extra work can weaken the heart over time. Aortic valve stenosis can range from mild to severe. If untreated, it can become more severe over time and can lead to heart failure. What are the causes? This condition may be caused by:  Buildup of calcium around and on the valve. This can occur with aging. This is the most common cause of aortic valve stenosis.  Birth defect.  Rheumatic  fever.  Radiation to the chest.  What increases the risk? You may be more likely to develop this condition if:  You are over the age of 49.  You were born with an abnormal bicuspid valve.  What are the signs or symptoms? You may have no symptoms until your condition becomes severe. It may take 10-20 years for mild or moderate aortic valve stenosis to become severe. Symptoms may include:  Shortness of breath. This may get worse during physical activity.  Feeling unusually weak and tired (fatigue).  Extreme discomfort in the chest, neck, or arm (angina).  A heartbeat that is irregular or faster than normal (palpitations).  Dizziness or fainting. This may happen when you get physically tired or after you take certain heart medicines, such as nitroglycerin.  How is this diagnosed? This condition may be diagnosed with:  A physical exam.  Echocardiogram. This is a type of imaging test that uses sound waves (ultrasound) to make an image of your heart. There are two types that may be used: ? Transthoracic echocardiogram (TTE). This type of echocardiogram is noninvasive, and it is usually done first. ? Transesophageal echocardiogram (TEE). This type of echocardiogram is done by passing a flexible tube down your esophagus. The heart and the esophagus are close to each other, so your health care provider can take very clear, detailed pictures of the heart using this type of test.  Cardiac catheterization. In this procedure, a thin, flexible tube (catheter) is passed through a large vein in your neck, groin, or arm. This procedure provides information  about arteries, structures, blood pressure, and oxygen levels in your heart.  Electrocardiogram (ECG). This records the electrical impulses of your heart and assesses heart function.  Stress tests. These are tests that evaluate the blood supply to your heart and your heart's response to exercise.  Blood tests.  You may work with a health  care provider who specializes in the heart (cardiologist). How is this treated? Treatment depends on how severe your condition is and what your symptoms are. You will need to have your heart checked regularly to make sure that your condition is not getting worse or causing serious problems. If your condition is mild, no treatment may be needed. Treatment may include:  Medicines that help keep your heart rate regular.  Medicines that thin your blood (anticoagulants) to prevent the formation of blood clots.  Antibiotic medicines to help prevent infection.  Surgery to replace your aortic valve. This is the most common treatment for aortic valve stenosis. Several types of surgeries are available. The surgery may be done through a large incision over your heart (open heart surgery), or it may be done using a minimally invasive technique (transcatheter aortic valve replacement, or TAVR).  Follow these instructions at home: Lifestyle   Limit alcohol intake to no more than 1 drink per day for nonpregnant women and 2 drinks per day for men. One drink equals 12 oz of beer, 5 oz of wine, or 1 oz of hard liquor.  Do not use any tobacco products, such as cigarettes, chewing tobacco, or e-cigarettes. If you need help quitting, ask your health care provider.  Work with your health care provider to manage your blood pressure and cholesterol.  Maintain a healthy weight. Eating and drinking  Follow instructions from your health care provider about eating or drinking restrictions. ? Limit how much caffeine you drink. Caffeine can affect your heart's rate and rhythm.  Drink enough fluid to keep your urine clear or pale yellow.  Eat a heart-healthy diet. This should include plenty of fresh fruits and vegetables. If you eat meat, it should be lean cuts. Avoid foods that are: ? High in salt, saturated fat, or sugar. ? Canned or highly processed. ? Fried. Activity  Return to your normal activities as  told by your health care provider. Ask your health care provider what activities are safe for you.  Exercise regularly, as told by your health care provider. Ask your health care provider what types of exercise are safe for you.  If your aortic valve stenosis is mild, you may need to avoid only very intense physical activity. The more severe your aortic valve stenosis is, the more activities you may need to avoid. General instructions  Take over-the-counter and prescription medicines only as told by your health care provider.  If you are a woman and you plan to become pregnant, talk with your health care provider before you become pregnant.  Tell all health care providers who care for you that you have aortic valve stenosis.  Keep all follow-up visits as told by your health care provider. This is important. Contact a health care provider if:  You have a fever. Get help right away if:  You develop chest pain or tightness.  You develop shortness of breath or difficulty breathing.  You feel light-headed.  You feel like you might faint.  Your heartbeat is irregular or faster than normal. These symptoms may represent a serious problem that is an emergency. Do not wait to see if the symptoms  will go away. Get medical help right away. Call your local emergency services (911 in the [place] Do not drive yourself to the hospital. This information is not intended to replace advice given to you by your health care provider. Make sure you discuss any questions you have with your health care provider. Document Released: 04/07/2003 Document Revised: 12/15/2015 Document Reviewed: 06/12/2015 Elsevier Interactive Patient Education  2017 [employer].

## 2017-11-06 NOTE — Progress Notes (Signed)
Cardiology Office Note:    Date:  11/06/2017    ID:  Eyleen Rawlinson, DOB 11-27-1945, MRN [id number]  PCP:  Ronita Hipps, MD  Cardiologist:  Shirlee More, MD    Referring MD: Ronita Hipps, MD    ASSESSMENT:    1. Nonrheumatic aortic valve stenosis   2. Essential hypertension   3. PVCs (premature ventricular contractions)   4. Hyperlipidemia, unspecified hyperlipidemia type    PLAN:    In order of problems listed above:  1. Stable her gradients have progressed although she is asymptomatic job repeat echocardiogram in 6 months and see me soon afterwards.  If she develops symptomatic severe aortic stenosis should be a good candidate for T AVR. 2. Stable blood pressure target continue current treatment including beta-blocker diuretic 3. Stable asymptomatic 4. Stable continue her statin recent labs requested from her PCP   Next appointment: 6 months   Medication Adjustments/Labs and Tests Ordered: Current medicines are reviewed at length with the patient today.  Concerns regarding medicines are outlined above.  Orders Placed This Encounter  Procedures  . ECHOCARDIOGRAM COMPLETE   No orders of the defined types were placed in this encounter.   Chief Complaint  Patient presents with  . Follow-up    History of Present Illness:    Judith Solis is a 72 y.o. female with a hx of aortic stenosis  last seen 05/10/17. Compliance with diet, lifestyle and medications: Yes Since her last visit her husband died.  She has been under increased stress but does remain active she has had neck pain improved with an injection.  She works vigorously in the garden and has had no syncope shortness of breath or chest pain. Past Medical History:  Diagnosis Date  . Aortic stenosis 04/05/2017  . Aortic valve regurgitation 04/05/2017  . Arrhythmia 04/05/2017  . Hyperlipidemia 04/05/2017  . Hypertension 04/05/2017    Past Surgical History:  Procedure Laterality Date  . ABDOMINAL HYSTERECTOMY     . CATARACT EXTRACTION    . ROTATOR CUFF REPAIR      Current Medications: Current Meds  Medication Sig  . aspirin EC 81 MG tablet Take 81 mg by mouth.  Marland Kitchen atorvastatin (LIPITOR) 10 MG tablet   . B Complex Vitamins (VITAMIN-B COMPLEX) TABS Take by mouth.  . Calcium Carb-Ergocalciferol 500-200 MG-UNIT TABS Take by mouth.  . citalopram (CELEXA) 40 MG tablet daily.  Marland Kitchen docusate sodium (COLACE) 100 MG capsule Take 100 mg by mouth 2 (two) times daily.  Marland Kitchen losartan-hydrochlorothiazide (HYZAAR) 50-12.5 MG tablet TAKE 1 TABLET EVERY DAY  . Multiple Vitamin (MULTI-VITAMINS) TABS Take by mouth.  . pantoprazole (PROTONIX) 40 MG tablet Take 40 mg by mouth daily.  . pramipexole (MIRAPEX) 0.125 MG tablet pramipexole 0.125 mg tablet  . traZODone (DESYREL) 50 MG tablet   . VENTOLIN HFA 108 (90 Base) MCG/ACT inhaler      Allergies:   Oxycodone-acetaminophen and Codeine   Social History   Socioeconomic History  . Marital status: Married    Spouse name: Not on file  . Number of children: Not on file  . Years of education: Not on file  . Highest education level: Not on file  Occupational History  . Not on file  Social Needs  . Financial resource strain: Not on file  . Food insecurity:    Worry: Not on file    Inability: Not on file  . Transportation needs:    Medical: Not on file    Non-medical: Not on  file  Tobacco Use  . Smoking status: Former Research scientist (life sciences)  . Smokeless tobacco: Never Used  Substance and Sexual Activity  . Alcohol use: No  . Drug use: No  . Sexual activity: Not on file  Lifestyle  . Physical activity:    Days per week: Not on file    Minutes per session: Not on file  . Stress: Not on file  Relationships  . Social connections:    Talks on phone: Not on file    Gets together: Not on file    Attends religious service: Not on file    Active member of club or organization: Not on file    Attends meetings of clubs or organizations: Not on file    Relationship status: Not  on file  Other Topics Concern  . Not on file  Social History Narrative  . Not on file     Family History: The patient's family history is negative for Heart disease. ROS:   Please see the history of present illness.    All other systems reviewed and are negative.  EKGs/Labs/Other Studies Reviewed:    The following studies were reviewed today:   Echo 09/12/16 showed moderate AS P/M 42/26 mmHg Echo 05/28/17: Study Conclusions - Left ventricle: The cavity size was normal. Systolic function was   normal. The estimated ejection fraction was in the range of 60%   to 65%. Wall motion was normal; there were no regional wall   motion abnormalities. - Aortic valve: There was moderate stenosis. There was mild   regurgitation. Valve area (VTI): 0.9 cm^2. Valve area (Vmax):   0.95 cm^2. Valve area (Vmean): 0.87 cm^2. Mean gradient (S): 32 mm Hg. Peak gradient (S): 54 mm Hg.   VTI ratio of LVOT to aortic valve: 0.26. Valve area (VTI): 0.9 cm^2. - Left atrium: The atrium was mildly dilated. - Pulmonary arteries: PA peak pressure: 34 mm Hg (S). Recent Labs: No results found for requested labs within last 8760 hours.  Recent Lipid Panel No results found for: CHOL, TRIG, HDL, CHOLHDL, VLDL, LDLCALC, LDLDIRECT  Physical Exam:    VS:  BP 134/78 (BP Location: Right Arm, Patient Position: Sitting, Cuff Size: Normal)   Pulse 86   Ht 5' 5.5" (1.664 m)   Wt 208 lb (94.3 kg)   SpO2 98%   BMI 34.09 kg/m     Wt Readings from Last 3 Encounters:  11/06/17 208 lb (94.3 kg)  05/10/17 193 lb (87.5 kg)  07/02/16 178 lb (80.7 kg)     GEN:  Well nourished, well developed in no acute distress HEENT: Normal NECK: No JVD; No carotid bruits LYMPHATICS: No lymphadenopathy CARDIAC: Grade 3/6 mid peaking S2 single no AR RRR,  RESPIRATORY:  Clear to auscultation without rales, wheezing or rhonchi  ABDOMEN: Soft, non-tender, non-distended MUSCULOSKELETAL:  No edema; No deformity  SKIN: Warm and  dry NEUROLOGIC:  Alert and oriented x 3 PSYCHIATRIC:  Normal affect    Signed, Shirlee More, MD  11/06/2017 7:37 PM    [place]

## 2017-11-07 DIAGNOSIS — M542 Cervicalgia: Secondary | ICD-10-CM | POA: Diagnosis not present

## 2017-11-20 DIAGNOSIS — R2231 Localized swelling, mass and lump, right upper limb: Secondary | ICD-10-CM | POA: Diagnosis not present

## 2017-11-20 DIAGNOSIS — Z9889 Other specified postprocedural states: Secondary | ICD-10-CM | POA: Diagnosis not present

## 2017-11-29 ENCOUNTER — Ambulatory Visit: Payer: Medicare Other | Admitting: Sports Medicine

## 2017-12-06 ENCOUNTER — Encounter: Payer: Self-pay | Admitting: Sports Medicine

## 2017-12-06 ENCOUNTER — Ambulatory Visit (INDEPENDENT_AMBULATORY_CARE_PROVIDER_SITE_OTHER): Payer: Medicare Other | Admitting: Sports Medicine

## 2017-12-06 DIAGNOSIS — M79676 Pain in unspecified toe(s): Secondary | ICD-10-CM | POA: Diagnosis not present

## 2017-12-06 DIAGNOSIS — L608 Other nail disorders: Secondary | ICD-10-CM

## 2017-12-06 DIAGNOSIS — B351 Tinea unguium: Secondary | ICD-10-CM

## 2017-12-06 NOTE — Progress Notes (Signed)
Subjective: Judith Solis is a 72 y.o. female patient seen today in office with complaint of mildly painful thickened and elongated toenails; unable to trim. Patient denies any changes with meds or medical history since last visit.  Patient has no other pedal complaints at this time.   Patient Active Problem List   Diagnosis Date Noted  . DDD (degenerative disc disease), cervical 09/12/2017  . Cervical spondylosis without myelopathy 08/29/2017  . Neck pain 08/29/2017  . Aortic stenosis 04/05/2017  . Aortic valve regurgitation 04/05/2017  . Arrhythmia 04/05/2017  . Hyperlipidemia 04/05/2017  . Essential hypertension 04/05/2017  . PVCs (premature ventricular contractions) 10/21/2016    Current Outpatient Medications on File Prior to Visit  Medication Sig Dispense Refill  . aspirin EC 81 MG tablet Take 81 mg by mouth.    Marland Kitchen atorvastatin (LIPITOR) 10 MG tablet     . B Complex Vitamins (VITAMIN-B COMPLEX) TABS Take by mouth.    . Calcium Carb-Ergocalciferol 500-200 MG-UNIT TABS Take by mouth.    . citalopram (CELEXA) 40 MG tablet daily.    Marland Kitchen docusate sodium (COLACE) 100 MG capsule Take 100 mg by mouth 2 (two) times daily.    Marland Kitchen losartan-hydrochlorothiazide (HYZAAR) 50-12.5 MG tablet TAKE 1 TABLET EVERY DAY 90 tablet 1  . Multiple Vitamin (MULTI-VITAMINS) TABS Take by mouth.    . pantoprazole (PROTONIX) 40 MG tablet Take 40 mg by mouth daily.    . pramipexole (MIRAPEX) 0.125 MG tablet pramipexole 0.125 mg tablet    . traZODone (DESYREL) 50 MG tablet     . VENTOLIN HFA 108 (90 Base) MCG/ACT inhaler      No current facility-administered medications on file prior to visit.     Allergies  Allergen Reactions  . Oxycodone-Acetaminophen   . Codeine Nausea And Vomiting    Objective: Physical Exam  General: Well developed, nourished, no acute distress, awake, alert and oriented x 3  Vascular: Dorsalis pedis artery 1/4 bilateral, Posterior tibial artery 1/4 bilateral, skin temperature warm  to warm proximal to distal bilateral lower extremities, moderate varicosities, no pedal hair present bilateral.  Neurological: Gross sensation present via light touch bilateral.   Dermatological: Skin is warm, dry, and supple bilateral, Nails 1-10 are tender, long with the most length on bilateral first and fourth toenails, thick, and discolored with mild subungal debris, no webspace macerations present bilateral, no open lesions present bilateral, no callus/corns/hyperkeratotic tissue present bilateral. No signs of infection bilateral.  Musculoskeletal: Asymptomatic bunion boney deformities noted bilateral. Muscular strength within normal limits without painon range of motion. No pain with calf compression bilateral.  Assessment and Plan:  Problem List Items Addressed This Visit    None    Visit Diagnoses    Pain due to onychomycosis of toenail    -  Primary   Pincer nail deformity         -Examined patient.  -Discussed treatment options for painful mycotic nails. -Mechanically debrided and reduced mycotic nails with sterile nail nipper and dremel nail file without incident. -Patient to return in 3 months for follow up evaluation or sooner if symptoms worsen.  [name], DPM

## 2017-12-09 DIAGNOSIS — M19272 Secondary osteoarthritis, left ankle and foot: Secondary | ICD-10-CM | POA: Diagnosis not present

## 2017-12-10 DIAGNOSIS — L720 Epidermal cyst: Secondary | ICD-10-CM | POA: Diagnosis not present

## 2017-12-10 DIAGNOSIS — L900 Lichen sclerosus et atrophicus: Secondary | ICD-10-CM | POA: Diagnosis not present

## 2017-12-23 DIAGNOSIS — M6281 Muscle weakness (generalized): Secondary | ICD-10-CM | POA: Diagnosis not present

## 2017-12-23 DIAGNOSIS — M25672 Stiffness of left ankle, not elsewhere classified: Secondary | ICD-10-CM | POA: Diagnosis not present

## 2017-12-23 DIAGNOSIS — R262 Difficulty in walking, not elsewhere classified: Secondary | ICD-10-CM | POA: Diagnosis not present

## 2017-12-23 DIAGNOSIS — M25572 Pain in left ankle and joints of left foot: Secondary | ICD-10-CM | POA: Diagnosis not present

## 2017-12-30 ENCOUNTER — Other Ambulatory Visit: Payer: Self-pay | Admitting: *Deleted

## 2017-12-30 ENCOUNTER — Telehealth: Payer: Self-pay | Admitting: *Deleted

## 2017-12-30 ENCOUNTER — Other Ambulatory Visit: Payer: Self-pay | Admitting: Orthopedic Surgery

## 2017-12-30 NOTE — Telephone Encounter (Signed)
Pt unable to keep appt for echocardiogram in [place] on 7/10. Gave pt number to reschedule and she thanked me and said she would do that.

## 2018-01-17 NOTE — Pre-Procedure Instructions (Signed)
Judith Solis  01/17/2018      [facility] - [place], [place] [address] Phone: [phone] Fax: [phone]  [place] [address] [place] - [place] [address] [place] [address] Phone: [phone] Fax: [phone]    Your procedure is scheduled on Tuesday July 9.  Report to [facility] Admitting at 10:15 A.M.  Call this number if you have problems the morning of surgery:  [phone]   Remember:  Do not eat or drink after midnight.    Take these medicines the morning of surgery with A SIP OF WATER:   Citalopram (Celexa) pantoprazole (protonix) Ventolin inhaler- please bring to hospital with you  7 days prior to surgery STOP taking any Aleve, Naproxen, Ibuprofen, Motrin, Advil, Goody's, BC's, all herbal medications, fish oil, and all vitamins  **Follow your surgeon's instructions on stopping Aspirin. If no instructions were given, please call your surgeon's office**    Do not wear jewelry, make-up or nail polish.  Do not wear lotions, powders, or perfumes, or deodorant.  Do not shave 48 hours prior to surgery.  Men may shave face and neck.  Do not bring valuables to the hospital.  [facility] is not responsible for any belongings or valuables.  Contacts, dentures or bridgework may not be worn into surgery.  Leave your suitcase in the car.  After surgery it may be brought to your room.  For patients admitted to the hospital, discharge time will be determined by your treatment team.  Patients discharged the day of surgery will not be allowed to drive home.   Special instructions:    Judith Solis- Preparing For Surgery  Before surgery, you can play an important role. Because skin is not sterile, your skin needs to be as free of germs as possible. You can reduce the number of germs on your skin by washing with CHG (chlorahexidine gluconate) Soap before  surgery.  CHG is an antiseptic cleaner which kills germs and bonds with the skin to continue killing germs even after washing.    Oral Hygiene is also important to reduce your risk of infection.  Remember - BRUSH YOUR TEETH THE MORNING OF SURGERY WITH YOUR REGULAR TOOTHPASTE  Please do not use if you have an allergy to CHG or antibacterial soaps. If your skin becomes reddened/irritated stop using the CHG.  Do not shave (including legs and underarms) for at least 48 hours prior to first CHG shower. It is OK to shave your face.  Please follow these instructions carefully.   1. Shower the NIGHT BEFORE SURGERY and the MORNING OF SURGERY with CHG.   2. If you chose to wash your hair, wash your hair first as usual with your normal shampoo.  3. After you shampoo, rinse your hair and body thoroughly to remove the shampoo.  4. Use CHG as you would any other liquid soap. You can apply CHG directly to the skin and wash gently with a scrungie or a clean washcloth.   5. Apply the CHG Soap to your body ONLY FROM THE NECK DOWN.  Do not use on open wounds or open sores. Avoid contact with your eyes, ears, mouth and genitals (private parts). Wash Face and genitals (private parts)  with your normal soap.  6. Wash thoroughly, paying special attention to the area where your surgery will be performed.  7. Thoroughly rinse your body with warm  water from the neck down.  8. DO NOT shower/wash with your normal soap after using and rinsing off the CHG Soap.  9. Pat yourself dry with a CLEAN TOWEL.  10. Wear CLEAN PAJAMAS to bed the night before surgery, wear comfortable clothes the morning of surgery  11. Place CLEAN SHEETS on your bed the night of your first shower and DO NOT SLEEP WITH PETS.    Day of Surgery:  Do not apply any deodorants/lotions.  Please wear clean clothes to the hospital/surgery center.   Remember to brush your teeth WITH YOUR REGULAR TOOTHPASTE.    Please read over the following  fact sheets that you were given. Coughing and Deep Breathing, MRSA Information and Surgical Site Infection Prevention

## 2018-01-20 ENCOUNTER — Encounter (HOSPITAL_COMMUNITY): Payer: Self-pay | Admitting: *Deleted

## 2018-01-20 ENCOUNTER — Other Ambulatory Visit: Payer: Self-pay

## 2018-01-20 ENCOUNTER — Encounter (HOSPITAL_COMMUNITY)
Admission: RE | Admit: 2018-01-20 | Discharge: 2018-01-20 | Disposition: A | Discharge status: Home / Self Care | Payer: Medicare Other | Source: Ambulatory Visit | Attending: Orthopedic Surgery | Admitting: Orthopedic Surgery

## 2018-01-20 DIAGNOSIS — Z01812 Encounter for preprocedural laboratory examination: Secondary | ICD-10-CM | POA: Insufficient documentation

## 2018-01-20 DIAGNOSIS — T1501XA Foreign body in cornea, right eye, initial encounter: Secondary | ICD-10-CM | POA: Diagnosis not present

## 2018-01-20 HISTORY — DX: Gastro-esophageal reflux disease without esophagitis: K21.9

## 2018-01-20 HISTORY — DX: Unspecified osteoarthritis, unspecified site: M19.90

## 2018-01-20 HISTORY — DX: Unspecified asthma, uncomplicated: J45.909

## 2018-01-20 LAB — SURGICAL PCR SCREEN
MRSA, PCR: NEGATIVE
STAPHYLOCOCCUS AUREUS: NEGATIVE

## 2018-01-20 LAB — BASIC METABOLIC PANEL
ANION GAP: 13 (ref 5–15)
BUN: 26 mg/dL — AB (ref 8–23)
CO2: 23 mmol/L (ref 22–32)
Calcium: 9.5 mg/dL (ref 8.9–10.3)
Chloride: 101 mmol/L (ref 98–111)
Creatinine, Ser: 0.98 mg/dL (ref 0.44–1.00)
GFR calc non Af Amer: 56 mL/min — ABNORMAL LOW (ref >60)
GLUCOSE: 119 mg/dL — AB (ref 70–99)
Potassium: 4.2 mmol/L (ref 3.5–5.1)
Sodium: 137 mmol/L (ref 135–145)

## 2018-01-20 LAB — CBC
HCT: 43.4 % (ref 36.0–46.0)
Hemoglobin: 14.2 g/dL (ref 12.0–15.0)
MCH: 30.5 pg (ref 26.0–34.0)
MCHC: 32.7 g/dL (ref 30.0–36.0)
MCV: 93.3 fL (ref 78.0–100.0)
Platelets: 259 10*3/uL (ref 150–400)
RBC: 4.65 MIL/uL (ref 3.87–5.11)
RDW: 12.4 % (ref 11.5–15.5)
WBC: 6.8 10*3/uL (ref 4.0–10.5)

## 2018-01-20 NOTE — Progress Notes (Signed)
PCP is Dr. Yetta Barre  LOV  09/2017 ? Cardio is Dr. Rinaldo Cloud.  She had seen him when he was in [facility], but he has since moved back to [place], [phone].  I have requested records, LOV, ekg, etc. She states she has murmur.  Will be having echo on 01/22/2018, had one back in 05/2017 Did have lumpectomy with chemo & radiation in 2009 &10.

## 2018-01-21 ENCOUNTER — Encounter (HOSPITAL_COMMUNITY): Payer: Self-pay | Admitting: Emergency Medicine

## 2018-01-21 NOTE — Progress Notes (Signed)
Anesthesia Chart Review:   Case:  [id number] Date/Time:  01/28/18 1230   Procedure:  Left total ankle replacement (Left Ankle)   Anesthesia type:  General   Pre-op diagnosis:  left ankle arthritis   Location:  [facility] OR ROOM 10 / [place] [facility]   Surgeon:  Wylene Simmer, MD      DISCUSSION: - Pt is a 72 year old female with hx aortic stenosis, HTN, PVCs  - Aortic stenosis now severe.  I asked Judeen Hammans in Dr. Nona Dell office to get input from Dr. Bettina Gavia about proceeding with surgery.  Surgery has now been cancelled.   VS: BP 139/84   Pulse 86   Temp 36.8 C   Resp 20   Ht 5\' 6"  (1.676 m)   Wt 210 lb 1.6 oz (95.3 kg)   SpO2 98%   BMI 33.91 kg/m    PROVIDERS: - PCP is Ronita Hipps, MD - Cardiologist is Shirlee More, MD. Last office visit 11/06/17   LABS: Labs reviewed: Acceptable for surgery. (all labs ordered are listed, but only abnormal results are displayed)  Labs Reviewed  BASIC METABOLIC PANEL - Abnormal; Notable for the following components:      Result Value   Glucose, Bld 119 (*)    BUN 26 (*)    GFR calc non Af Amer 56 (*)    All other components within normal limits  SURGICAL PCR SCREEN  CBC    EKG 05/10/17: sinus rhythm with occasional PVCs. Nonspecific T wave abnormality. Prolonged QT   CV:  Echo 01/22/18: - Left ventricle: The cavity size was normal. Wall thickness was increased in a pattern of moderate LVH. Systolic function was normal. The estimated ejection fraction was in the range of 50% to 55%. Wall motion was normal; there were no regional wall motion abnormalities. Doppler parameters are consistent with abnormal left ventricular relaxation (grade 1 diastolic dysfunction). - Aortic valve: Cusp separation was severely reduced. Valve mobility was restricted. There was severe stenosis. There was mild regurgitation. Valve area (VTI): 0.99 cm^2. Valve area (Vmax): 0.92 cm^2. Valve area (Vmean): 0.97 cm^2. - Mitral valve: Mildly to moderately calcified annulus. Mildly  thickened leaflets . - Left atrium: The atrium was mildly dilated. - Pulmonary arteries: PA peak pressure: 37 mm Hg (S).   Past Medical History:  Diagnosis Date  . Aortic stenosis 04/05/2017  . Aortic valve regurgitation 04/05/2017  . Arrhythmia 04/05/2017  . Arthritis   . Asthma    SEASONAL  . Cancer (HCC)    LEFT BREAST CANCER  . Dysrhythmia    PVC"S  . GERD (gastroesophageal reflux disease)   . Heart murmur   . Hyperlipidemia 04/05/2017  . Hypertension 04/05/2017    Past Surgical History:  Procedure Laterality Date  . ABDOMINAL HYSTERECTOMY    . BREAST SURGERY     LUMPECTOMY WITH CHEMO & RADIATION  . CATARACT EXTRACTION    . EYE SURGERY    . JOINT REPLACEMENT     BILATERAL HIPS 2007 & 2008  . ROTATOR CUFF REPAIR    . TONSILLECTOMY      MEDICATIONS: . Ascorbic Acid (VITAMIN C) 1000 MG tablet  . aspirin EC 81 MG tablet  . atorvastatin (LIPITOR) 10 MG tablet  . Cholecalciferol (VITAMIN D PO)  . citalopram (CELEXA) 20 MG tablet  . clobetasol cream (TEMOVATE) 0.05 %  . losartan-hydrochlorothiazide (HYZAAR) 50-12.5 MG tablet  . magnesium gluconate (MAGONATE) 500 MG tablet  . Multiple Vitamin (MULTI-VITAMINS) TABS  . pantoprazole (PROTONIX) 40  MG tablet  . pramipexole (MIRAPEX) 0.125 MG tablet  . traZODone (DESYREL) 50 MG tablet  . VENTOLIN HFA 108 (90 Base) MCG/ACT inhaler   No current facility-administered medications for this encounter.     Willeen Cass, FNP-BC [facility]/Anesthesiology Phone: [phone] 01/27/2018 9:31 AM

## 2018-01-22 ENCOUNTER — Other Ambulatory Visit: Payer: Self-pay

## 2018-01-22 ENCOUNTER — Ambulatory Visit (INDEPENDENT_AMBULATORY_CARE_PROVIDER_SITE_OTHER): Payer: Medicare Other

## 2018-01-22 DIAGNOSIS — I350 Nonrheumatic aortic (valve) stenosis: Secondary | ICD-10-CM | POA: Diagnosis not present

## 2018-01-22 NOTE — Progress Notes (Signed)
Echocardiogram performed  Jimmy Lawton Dollinger RDCS 

## 2018-01-24 ENCOUNTER — Telehealth: Payer: Self-pay | Admitting: Cardiology

## 2018-01-24 NOTE — Telephone Encounter (Signed)
Judith Solis notified that patient had recent echocardiogram which showed worsening aortic stenosis.  Patient will be seen by Dr Bettina Gavia in the next week to discuss further treatment.  Judith Solis advised patient has not been cleared for surgery at this time.

## 2018-01-24 NOTE — Telephone Encounter (Signed)
Has questions about her surgical clearance

## 2018-01-26 NOTE — Progress Notes (Signed)
Cardiology Office Note:    Date:  01/27/2018   ID:  Judith Solis, DOB [birth year], MRN [id number]  PCP:  Ronita Hipps, MD  Cardiologist:  Shirlee More, MD    Referring MD: Ronita Hipps, MD    ASSESSMENT:    1. Nonrheumatic aortic valve stenosis   2. Essential hypertension   3. PVCs (premature ventricular contractions)    PLAN:    In order of problems listed above:  1. Her valvular aortic stenosis has progressed there is no hemodynamically severe and recently she has developed symptoms of marked exercise intolerance and limiting dyspnea on exertion may cause her to stop and rest when she tries to work in her garden.  Prior to coming to the office today she was convinced that she had significant valvular heart disease.  She lives independently after discussion of options benefits and risk agrees to undergo right and left heart catheterization and if confirmed severe aortic stenosis consideration of valvular intervention either surgical or TAVR.  She has no dye allergy or renal dysfunction. 2. Stable continue current treatment 3. Stable asymptomatic at this time   Next appointment: 6 weeks   Medication Adjustments/Labs and Tests Ordered: Current medicines are reviewed at length with the patient today.  Concerns regarding medicines are outlined above.  No orders of the defined types were placed in this encounter.  No orders of the defined types were placed in this encounter.   Chief Complaint  Patient presents with  . Aortic Stenosis    History of Present Illness:    Judith Solis is a 73 y.o. female with a hx of hypertension, hyperlipidemia and aortic stenosis  last seen 11/06/17. Her TTE 01/22/18 showed severe AS AVA 0.99 P/M 73/41 mm Hg and VTI ratio 0.26 Compliance with diet, lifestyle and medications: Yes  The last week she has noticed a change where she has developed exercise intolerance and severe limiting exertional dyspnea when she tries to do activities like  walking or garden that causes her to have to stop and rest to recover.  There is been no chest tightness or syncope.  She has no edema orthopnea.  Her echocardiogram shows that she has developed severe valvular aortic stenosis she is symptomatic stage D we discussed intervention and after review of options benefits and risk will undergo right and left heart catheterization for consideration of intervention either surgical or TAVR Past Medical History:  Diagnosis Date  . Aortic stenosis 04/05/2017  . Aortic valve regurgitation 04/05/2017  . Arrhythmia 04/05/2017  . Arthritis   . Asthma    SEASONAL  . Cancer (HCC)    LEFT BREAST CANCER  . Dysrhythmia    PVC"S  . GERD (gastroesophageal reflux disease)   . Heart murmur   . Hyperlipidemia 04/05/2017  . Hypertension 04/05/2017    Past Surgical History:  Procedure Laterality Date  . ABDOMINAL HYSTERECTOMY    . BREAST SURGERY     LUMPECTOMY WITH CHEMO & RADIATION  . CATARACT EXTRACTION    . EYE SURGERY    . JOINT REPLACEMENT     BILATERAL HIPS 2007 & 2008  . ROTATOR CUFF REPAIR    . TONSILLECTOMY      Current Medications: Current Meds  Medication Sig  . aspirin EC 81 MG tablet Take 81 mg by mouth daily.   Marland Kitchen atorvastatin (LIPITOR) 10 MG tablet Take 10 mg by mouth at bedtime.   . Cholecalciferol (VITAMIN D PO) Take 125 mcg by mouth daily.  . citalopram (  CELEXA) 20 MG tablet Take 20 mg by mouth daily.   Marland Kitchen losartan-hydrochlorothiazide (HYZAAR) 50-12.5 MG tablet TAKE 1 TABLET EVERY DAY  . magnesium gluconate (MAGONATE) 500 MG tablet Take 500 mg by mouth at bedtime.  . Multiple Vitamin (MULTI-VITAMINS) TABS Take 1 tablet by mouth daily.   . pantoprazole (PROTONIX) 40 MG tablet Take 40 mg by mouth daily.  . pramipexole (MIRAPEX) 0.125 MG tablet Take 0.125 mg by mouth every night at bedtime  . traZODone (DESYREL) 50 MG tablet Take 50 mg by mouth at bedtime.   . VENTOLIN HFA 108 (90 Base) MCG/ACT inhaler Inhale 1 puff into the lungs every 6  (six) hours as needed for wheezing or shortness of breath.      Allergies:   Oxycodone-acetaminophen and Codeine   Social History   Socioeconomic History  . Marital status: Widowed    Spouse name: Not on file  . Number of children: Not on file  . Years of education: Not on file  . Highest education level: Not on file  Occupational History  . Not on file  Social Needs  . Financial resource strain: Not on file  . Food insecurity:    Worry: Not on file    Inability: Not on file  . Transportation needs:    Medical: Not on file    Non-medical: Not on file  Tobacco Use  . Smoking status: Former Research scientist (life sciences)  . Smokeless tobacco: Never Used  Substance and Sexual Activity  . Alcohol use: No  . Drug use: No  . Sexual activity: Not on file  Lifestyle  . Physical activity:    Days per week: Not on file    Minutes per session: Not on file  . Stress: Not on file  Relationships  . Social connections:    Talks on phone: Not on file    Gets together: Not on file    Attends religious service: Not on file    Active member of club or organization: Not on file    Attends meetings of clubs or organizations: Not on file    Relationship status: Not on file  Other Topics Concern  . Not on file  Social History Narrative  . Not on file     Family History: The patient's family history is negative for Heart disease. ROS:   Please see the history of present illness.    All other systems reviewed and are negative.  EKGs/Labs/Other Studies Reviewed:    The following studies were reviewed today:  EKG:  EKG ordered today.  The ekg ordered today demonstrates sinus rhythm consider old anterior septal MI  Recent Labs: 01/20/2018: BUN 26; Creatinine, Ser 0.98; Hemoglobin 14.2; Platelets 259; Potassium 4.2; Sodium 137  Recent Lipid Panel No results found for: CHOL, TRIG, HDL, CHOLHDL, VLDL, LDLCALC, LDLDIRECT  Physical Exam:    VS:  BP 128/72 (BP Location: Right Arm, Patient Position: Sitting,  Cuff Size: Normal)   Pulse 75   Ht 5\' 6"  (1.676 m)   Wt 203 lb (92.1 kg)   SpO2 95%   BMI 32.77 kg/m     Wt Readings from Last 3 Encounters:  01/27/18 203 lb (92.1 kg)  01/20/18 210 lb 1.6 oz (95.3 kg)  11/06/17 208 lb (94.3 kg)     GEN:  Well nourished, well developed in no acute distress HEENT: Normal NECK: No JVD; No carotid bruits LYMPHATICS: No lymphadenopathy CARDIAC: single S2 RRR, no murmurs, rubs, gallops RESPIRATORY:  Clear to auscultation without rales,  wheezing or rhonchi  ABDOMEN: Soft, non-tender, non-distended MUSCULOSKELETAL:  No edema; No deformity  SKIN: Warm and dry NEUROLOGIC:  Alert and oriented x 3 PSYCHIATRIC:  Normal affect    Signed, Shirlee More, MD  01/27/2018 9:29 AM    [facility]

## 2018-01-26 NOTE — H&P (View-Only) (Signed)
Cardiology Office Note:    Date:  01/27/2018   ID:  Judith Solis, DOB Mar 25, 1946, MRN [id number]  PCP:  Ronita Hipps, MD  Cardiologist:  Shirlee More, MD    Referring MD: Ronita Hipps, MD    ASSESSMENT:    1. Nonrheumatic aortic valve stenosis   2. Essential hypertension   3. PVCs (premature ventricular contractions)    PLAN:    In order of problems listed above:  1. Her valvular aortic stenosis has progressed there is no hemodynamically severe and recently she has developed symptoms of marked exercise intolerance and limiting dyspnea on exertion may cause her to stop and rest when she tries to work in her garden.  Prior to coming to the office today she was convinced that she had significant valvular heart disease.  She lives independently after discussion of options benefits and risk agrees to undergo right and left heart catheterization and if confirmed severe aortic stenosis consideration of valvular intervention either surgical or TAVR.  She has no dye allergy or renal dysfunction. 2. Stable continue current treatment 3. Stable asymptomatic at this time   Next appointment: 6 weeks   Medication Adjustments/Labs and Tests Ordered: Current medicines are reviewed at length with the patient today.  Concerns regarding medicines are outlined above.  No orders of the defined types were placed in this encounter.  No orders of the defined types were placed in this encounter.   Chief Complaint  Patient presents with  . Aortic Stenosis    History of Present Illness:    Judith Solis is a 72 y.o. female with a hx of hypertension, hyperlipidemia and aortic stenosis  last seen 11/06/17. Her TTE 01/22/18 showed severe AS AVA 0.99 P/M 73/41 mm Hg and VTI ratio 0.26 Compliance with diet, lifestyle and medications: Yes  The last week she has noticed a change where she has developed exercise intolerance and severe limiting exertional dyspnea when she tries to do activities like  walking or garden that causes her to have to stop and rest to recover.  There is been no chest tightness or syncope.  She has no edema orthopnea.  Her echocardiogram shows that she has developed severe valvular aortic stenosis she is symptomatic stage D we discussed intervention and after review of options benefits and risk will undergo right and left heart catheterization for consideration of intervention either surgical or TAVR Past Medical History:  Diagnosis Date  . Aortic stenosis 04/05/2017  . Aortic valve regurgitation 04/05/2017  . Arrhythmia 04/05/2017  . Arthritis   . Asthma    SEASONAL  . Cancer (HCC)    LEFT BREAST CANCER  . Dysrhythmia    PVC"S  . GERD (gastroesophageal reflux disease)   . Heart murmur   . Hyperlipidemia 04/05/2017  . Hypertension 04/05/2017    Past Surgical History:  Procedure Laterality Date  . ABDOMINAL HYSTERECTOMY    . BREAST SURGERY     LUMPECTOMY WITH CHEMO & RADIATION  . CATARACT EXTRACTION    . EYE SURGERY    . JOINT REPLACEMENT     BILATERAL HIPS 2007 & 2008  . ROTATOR CUFF REPAIR    . TONSILLECTOMY      Current Medications: Current Meds  Medication Sig  . aspirin EC 81 MG tablet Take 81 mg by mouth daily.   Marland Kitchen atorvastatin (LIPITOR) 10 MG tablet Take 10 mg by mouth at bedtime.   . Cholecalciferol (VITAMIN D PO) Take 125 mcg by mouth daily.  . citalopram (  CELEXA) 20 MG tablet Take 20 mg by mouth daily.   Marland Kitchen losartan-hydrochlorothiazide (HYZAAR) 50-12.5 MG tablet TAKE 1 TABLET EVERY DAY  . magnesium gluconate (MAGONATE) 500 MG tablet Take 500 mg by mouth at bedtime.  . Multiple Vitamin (MULTI-VITAMINS) TABS Take 1 tablet by mouth daily.   . pantoprazole (PROTONIX) 40 MG tablet Take 40 mg by mouth daily.  . pramipexole (MIRAPEX) 0.125 MG tablet Take 0.125 mg by mouth every night at bedtime  . traZODone (DESYREL) 50 MG tablet Take 50 mg by mouth at bedtime.   . VENTOLIN HFA 108 (90 Base) MCG/ACT inhaler Inhale 1 puff into the lungs every 6  (six) hours as needed for wheezing or shortness of breath.      Allergies:   Oxycodone-acetaminophen and Codeine   Social History   Socioeconomic History  . Marital status: Widowed    Spouse name: Not on file  . Number of children: Not on file  . Years of education: Not on file  . Highest education level: Not on file  Occupational History  . Not on file  Social Needs  . Financial resource strain: Not on file  . Food insecurity:    Worry: Not on file    Inability: Not on file  . Transportation needs:    Medical: Not on file    Non-medical: Not on file  Tobacco Use  . Smoking status: Former Research scientist (life sciences)  . Smokeless tobacco: Never Used  Substance and Sexual Activity  . Alcohol use: No  . Drug use: No  . Sexual activity: Not on file  Lifestyle  . Physical activity:    Days per week: Not on file    Minutes per session: Not on file  . Stress: Not on file  Relationships  . Social connections:    Talks on phone: Not on file    Gets together: Not on file    Attends religious service: Not on file    Active member of club or organization: Not on file    Attends meetings of clubs or organizations: Not on file    Relationship status: Not on file  Other Topics Concern  . Not on file  Social History Narrative  . Not on file     Family History: The patient's family history is negative for Heart disease. ROS:   Please see the history of present illness.    All other systems reviewed and are negative.  EKGs/Labs/Other Studies Reviewed:    The following studies were reviewed today:  EKG:  EKG ordered today.  The ekg ordered today demonstrates sinus rhythm consider old anterior septal MI  Recent Labs: 01/20/2018: BUN 26; Creatinine, Ser 0.98; Hemoglobin 14.2; Platelets 259; Potassium 4.2; Sodium 137  Recent Lipid Panel No results found for: CHOL, TRIG, HDL, CHOLHDL, VLDL, LDLCALC, LDLDIRECT  Physical Exam:    VS:  BP 128/72 (BP Location: Right Arm, Patient Position: Sitting,  Cuff Size: Normal)   Pulse 75   Ht 5\' 6"  (1.676 m)   Wt 203 lb (92.1 kg)   SpO2 95%   BMI 32.77 kg/m     Wt Readings from Last 3 Encounters:  01/27/18 203 lb (92.1 kg)  01/20/18 210 lb 1.6 oz (95.3 kg)  11/06/17 208 lb (94.3 kg)     GEN:  Well nourished, well developed in no acute distress HEENT: Normal NECK: No JVD; No carotid bruits LYMPHATICS: No lymphadenopathy CARDIAC: single S2 RRR, no murmurs, rubs, gallops RESPIRATORY:  Clear to auscultation without rales,  wheezing or rhonchi  ABDOMEN: Soft, non-tender, non-distended MUSCULOSKELETAL:  No edema; No deformity  SKIN: Warm and dry NEUROLOGIC:  Alert and oriented x 3 PSYCHIATRIC:  Normal affect    Signed, Shirlee More, MD  01/27/2018 9:29 AM    [facility]

## 2018-01-27 ENCOUNTER — Encounter: Payer: Self-pay | Admitting: Cardiology

## 2018-01-27 ENCOUNTER — Ambulatory Visit (INDEPENDENT_AMBULATORY_CARE_PROVIDER_SITE_OTHER): Payer: Medicare Other | Admitting: Cardiology

## 2018-01-27 VITALS — BP 128/72 | HR 75 | Ht 66.0 in | Wt 203.0 lb

## 2018-01-27 DIAGNOSIS — I10 Essential (primary) hypertension: Secondary | ICD-10-CM | POA: Diagnosis not present

## 2018-01-27 DIAGNOSIS — I493 Ventricular premature depolarization: Secondary | ICD-10-CM | POA: Diagnosis not present

## 2018-01-27 DIAGNOSIS — I700 Atherosclerosis of aorta: Secondary | ICD-10-CM | POA: Diagnosis not present

## 2018-01-27 DIAGNOSIS — I350 Nonrheumatic aortic (valve) stenosis: Secondary | ICD-10-CM

## 2018-01-27 DIAGNOSIS — Z01818 Encounter for other preprocedural examination: Secondary | ICD-10-CM | POA: Diagnosis not present

## 2018-01-27 NOTE — Patient Instructions (Addendum)
Medication Instructions:  Your physician recommends that you continue on your current medications as directed. Please refer to the Current Medication list given to you today.   Labwork: NONE  Testing/Procedures: You had an EKG today  A chest x-ray takes a picture of the organs and structures inside the chest, including the heart, lungs, and blood vessels. This test can show several things, including, whether the heart is enlarges; whether fluid is building up in the lungs; and whether pacemaker / defibrillator leads are still in place.  Your physician has requested that you have a cardiac catheterization. Cardiac catheterization is used to diagnose and/or treat various heart conditions. Doctors may recommend this procedure for a number of different reasons. The most common reason is to evaluate chest pain. Chest pain can be a symptom of coronary artery disease (CAD), and cardiac catheterization can show whether plaque is narrowing or blocking your heart's arteries. This procedure is also used to evaluate the valves, as well as measure the blood flow and oxygen levels in different parts of your heart. For further information please visit [facility]. Please follow instruction sheet, as given.   Follow-Up: Your physician recommends that you schedule a follow-up appointment in: 6 weeks   Any Other Special Instructions Will Be Listed Below (If Applicable).     If you need a refill on your cardiac medications before your next appointment, please call your pharmacy.      Aortic Valve Stenosis Aortic valve stenosis is a narrowing of the aortic valve. The aortic valve opens and closes to regulate blood flow between the lower left chamber of the heart (left ventricle) and the blood vessel that leads away from the heart (aorta). When the aortic valve becomes narrow, it makes it difficult for the heart to pump blood into the aorta, which causes the heart to work harder. The extra work can  weaken the heart over time. Aortic valve stenosis can range from mild to severe. If untreated, it can become more severe over time and can lead to heart failure. What are the causes? This condition may be caused by:  Buildup of calcium around and on the valve. This can occur with aging. This is the most common cause of aortic valve stenosis.  Birth defect.  Rheumatic fever.  Radiation to the chest.  What increases the risk? You may be more likely to develop this condition if:  You are over the age of 63.  You were born with an abnormal bicuspid valve.  What are the signs or symptoms? You may have no symptoms until your condition becomes severe. It may take 10-20 years for mild or moderate aortic valve stenosis to become severe. Symptoms may include:  Shortness of breath. This may get worse during physical activity.  Feeling unusually weak and tired (fatigue).  Extreme discomfort in the chest, neck, or arm (angina).  A heartbeat that is irregular or faster than normal (palpitations).  Dizziness or fainting. This may happen when you get physically tired or after you take certain heart medicines, such as nitroglycerin.  How is this diagnosed? This condition may be diagnosed with:  A physical exam.  Echocardiogram. This is a type of imaging test that uses sound waves (ultrasound) to make an image of your heart. There are two types that may be used: ? Transthoracic echocardiogram (TTE). This type of echocardiogram is noninvasive, and it is usually done first. ? Transesophageal echocardiogram (TEE). This type of echocardiogram is done by passing a flexible tube down your  esophagus. The heart and the esophagus are close to each other, so your health care provider can take very clear, detailed pictures of the heart using this type of test.  Cardiac catheterization. In this procedure, a thin, flexible tube (catheter) is passed through a large vein in your neck, groin, or arm. This  procedure provides information about arteries, structures, blood pressure, and oxygen levels in your heart.  Electrocardiogram (ECG). This records the electrical impulses of your heart and assesses heart function.  Stress tests. These are tests that evaluate the blood supply to your heart and your heart's response to exercise.  Blood tests.  You may work with a health care provider who specializes in the heart (cardiologist). How is this treated? Treatment depends on how severe your condition is and what your symptoms are. You will need to have your heart checked regularly to make sure that your condition is not getting worse or causing serious problems. If your condition is mild, no treatment may be needed. Treatment may include:  Medicines that help keep your heart rate regular.  Medicines that thin your blood (anticoagulants) to prevent the formation of blood clots.  Antibiotic medicines to help prevent infection.  Surgery to replace your aortic valve. This is the most common treatment for aortic valve stenosis. Several types of surgeries are available. The surgery may be done through a large incision over your heart (open heart surgery), or it may be done using a minimally invasive technique (transcatheter aortic valve replacement, or TAVR).  Follow these instructions at home: Lifestyle   Limit alcohol intake to no more than 1 drink per day for nonpregnant women and 2 drinks per day for men. One drink equals 12 oz of beer, 5 oz of wine, or 1 oz of hard liquor.  Do not use any tobacco products, such as cigarettes, chewing tobacco, or e-cigarettes. If you need help quitting, ask your health care provider.  Work with your health care provider to manage your blood pressure and cholesterol.  Maintain a healthy weight. Eating and drinking  Follow instructions from your health care provider about eating or drinking restrictions. ? Limit how much caffeine you drink. Caffeine can  affect your heart's rate and rhythm.  Drink enough fluid to keep your urine clear or pale yellow.  Eat a heart-healthy diet. This should include plenty of fresh fruits and vegetables. If you eat meat, it should be lean cuts. Avoid foods that are: ? High in salt, saturated fat, or sugar. ? Canned or highly processed. ? Fried. Activity  Return to your normal activities as told by your health care provider. Ask your health care provider what activities are safe for you.  Exercise regularly, as told by your health care provider. Ask your health care provider what types of exercise are safe for you.  If your aortic valve stenosis is mild, you may need to avoid only very intense physical activity. The more severe your aortic valve stenosis is, the more activities you may need to avoid. General instructions  Take over-the-counter and prescription medicines only as told by your health care provider.  If you are a woman and you plan to become pregnant, talk with your health care provider before you become pregnant.  Tell all health care providers who care for you that you have aortic valve stenosis.  Keep all follow-up visits as told by your health care provider. This is important. Contact a health care provider if:  You have a fever. Get help right  away if:  You develop chest pain or tightness.  You develop shortness of breath or difficulty breathing.  You feel light-headed.  You feel like you might faint.  Your heartbeat is irregular or faster than normal. These symptoms may represent a serious problem that is an emergency. Do not wait to see if the symptoms will go away. Get medical help right away. Call your local emergency services (911 in the [place] Do not drive yourself to the hospital. This information is not intended to replace advice given to you by your health care provider. Make sure you discuss any questions you have with your health care provider. Document Released:  04/07/2003 Document Revised: 12/15/2015 Document Reviewed: 06/12/2015 Elsevier Interactive Patient Education  2017 [employer].

## 2018-01-28 ENCOUNTER — Inpatient Hospital Stay (HOSPITAL_COMMUNITY): Admission: RE | Admit: 2018-01-28 | Payer: Medicare Other | Source: Ambulatory Visit | Admitting: Orthopedic Surgery

## 2018-01-28 ENCOUNTER — Encounter (HOSPITAL_COMMUNITY): Admission: RE | Payer: Self-pay | Source: Ambulatory Visit

## 2018-01-28 SURGERY — ARTHROPLASTY, ANKLE, TOTAL
Anesthesia: General | Site: Ankle | Laterality: Left

## 2018-01-29 ENCOUNTER — Telehealth: Payer: Self-pay | Admitting: *Deleted

## 2018-01-29 ENCOUNTER — Other Ambulatory Visit: Payer: Medicare Other

## 2018-01-29 NOTE — Telephone Encounter (Signed)
Pt contacted pre-catheterization scheduled at [facility] for: Friday January 31, 2018 10:30 AM Verified arrival time and place: [place] Entrance A at: 8 AM  No solid food after midnight prior to cath, clear liquids until 5 AM day of procedure. Verified allergies in Epic Verified no diabetes medications.  Hold: Losartan-HCTZ 01/30/18 and 01/31/18  AM meds can be  taken pre-cath with sip of water including: ASA 81 mg  Confirmed patient has responsible person to drive home post procedure and for 24 hours after you arrive home: yes

## 2018-01-30 ENCOUNTER — Other Ambulatory Visit: Payer: Self-pay | Admitting: Cardiology

## 2018-01-31 ENCOUNTER — Ambulatory Visit (HOSPITAL_COMMUNITY)
Admission: RE | Admit: 2018-01-31 | Discharge: 2018-01-31 | Disposition: A | Discharge status: Home / Self Care | Payer: Medicare Other | Source: Ambulatory Visit | Attending: Cardiovascular Disease | Admitting: Cardiovascular Disease

## 2018-01-31 ENCOUNTER — Encounter (HOSPITAL_COMMUNITY)
Admission: RE | Disposition: A | Discharge status: Home / Self Care | Payer: Self-pay | Source: Ambulatory Visit | Attending: Cardiovascular Disease

## 2018-01-31 ENCOUNTER — Encounter (HOSPITAL_COMMUNITY): Payer: Self-pay | Admitting: *Deleted

## 2018-01-31 DIAGNOSIS — Z885 Allergy status to narcotic agent status: Secondary | ICD-10-CM | POA: Diagnosis not present

## 2018-01-31 DIAGNOSIS — E785 Hyperlipidemia, unspecified: Secondary | ICD-10-CM | POA: Insufficient documentation

## 2018-01-31 DIAGNOSIS — Z87891 Personal history of nicotine dependence: Secondary | ICD-10-CM | POA: Insufficient documentation

## 2018-01-31 DIAGNOSIS — Z7982 Long term (current) use of aspirin: Secondary | ICD-10-CM | POA: Diagnosis not present

## 2018-01-31 DIAGNOSIS — J45909 Unspecified asthma, uncomplicated: Secondary | ICD-10-CM | POA: Diagnosis not present

## 2018-01-31 DIAGNOSIS — M1990 Unspecified osteoarthritis, unspecified site: Secondary | ICD-10-CM | POA: Diagnosis not present

## 2018-01-31 DIAGNOSIS — I10 Essential (primary) hypertension: Secondary | ICD-10-CM | POA: Diagnosis not present

## 2018-01-31 DIAGNOSIS — K219 Gastro-esophageal reflux disease without esophagitis: Secondary | ICD-10-CM | POA: Insufficient documentation

## 2018-01-31 DIAGNOSIS — I352 Nonrheumatic aortic (valve) stenosis with insufficiency: Secondary | ICD-10-CM | POA: Diagnosis not present

## 2018-01-31 DIAGNOSIS — I350 Nonrheumatic aortic (valve) stenosis: Secondary | ICD-10-CM

## 2018-01-31 DIAGNOSIS — I493 Ventricular premature depolarization: Secondary | ICD-10-CM | POA: Diagnosis not present

## 2018-01-31 HISTORY — PX: RIGHT/LEFT HEART CATH AND CORONARY ANGIOGRAPHY: CATH118266

## 2018-01-31 LAB — POCT I-STAT 3, ART BLOOD GAS (G3+)
BICARBONATE: 24.6 mmol/L (ref 20.0–28.0)
O2 Saturation: 94 %
TCO2: 26 mmol/L (ref 22–32)
pCO2 arterial: 38.3 mmHg (ref 32.0–48.0)
pH, Arterial: 7.415 (ref 7.350–7.450)
pO2, Arterial: 70 mmHg — ABNORMAL LOW (ref 83.0–108.0)

## 2018-01-31 LAB — POCT I-STAT 3, VENOUS BLOOD GAS (G3P V)
Acid-base deficit: 2 mmol/L (ref 0.0–2.0)
Bicarbonate: 23.6 mmol/L (ref 20.0–28.0)
O2 SAT: 68 %
PCO2 VEN: 41.3 mmHg — AB (ref 44.0–60.0)
PH VEN: 7.365 (ref 7.250–7.430)
TCO2: 25 mmol/L (ref 22–32)
pO2, Ven: 37 mmHg (ref 32.0–45.0)

## 2018-01-31 SURGERY — RIGHT/LEFT HEART CATH AND CORONARY ANGIOGRAPHY
Anesthesia: LOCAL

## 2018-01-31 MED ORDER — LIDOCAINE HCL (PF) 1 % IJ SOLN
INTRAMUSCULAR | Status: AC
  Filled 2018-01-31: qty 30

## 2018-01-31 MED ORDER — SODIUM CHLORIDE 0.9 % WEIGHT BASED INFUSION
3.0000 mL/kg/h | INTRAVENOUS | Status: AC
  Administered 2018-01-31: 3 mL/kg/h via INTRAVENOUS

## 2018-01-31 MED ORDER — FENTANYL CITRATE (PF) 100 MCG/2ML IJ SOLN
INTRAMUSCULAR | Status: AC
  Filled 2018-01-31: qty 2

## 2018-01-31 MED ORDER — HEPARIN SODIUM (PORCINE) 1000 UNIT/ML IJ SOLN
INTRAMUSCULAR | Status: DC | PRN
  Administered 2018-01-31: 5000 [IU] via INTRAVENOUS

## 2018-01-31 MED ORDER — LIDOCAINE HCL (PF) 1 % IJ SOLN
INTRAMUSCULAR | Status: DC | PRN
  Administered 2018-01-31: 2 mL via INTRADERMAL
  Administered 2018-01-31: 3 mL via INTRADERMAL

## 2018-01-31 MED ORDER — ONDANSETRON HCL 4 MG/2ML IJ SOLN: 4.0000 mg | Freq: Four times a day (QID) | INTRAMUSCULAR | Status: DC | PRN

## 2018-01-31 MED ORDER — HEPARIN (PORCINE) IN NACL 1000-0.9 UT/500ML-% IV SOLN
INTRAVENOUS | Status: DC | PRN
  Administered 2018-01-31 (×2): 500 mL

## 2018-01-31 MED ORDER — SODIUM CHLORIDE 0.9% FLUSH: 3.0000 mL | INTRAVENOUS | Status: DC | PRN

## 2018-01-31 MED ORDER — IOPAMIDOL (ISOVUE-370) INJECTION 76%
INTRAVENOUS | Status: DC | PRN
  Administered 2018-01-31: 80 mL via INTRA_ARTERIAL

## 2018-01-31 MED ORDER — MIDAZOLAM HCL 2 MG/2ML IJ SOLN
INTRAMUSCULAR | Status: DC | PRN
  Administered 2018-01-31: 1 mg via INTRAVENOUS

## 2018-01-31 MED ORDER — VERAPAMIL HCL 2.5 MG/ML IV SOLN
INTRAVENOUS | Status: DC | PRN
  Administered 2018-01-31: 10 mL via INTRA_ARTERIAL

## 2018-01-31 MED ORDER — SODIUM CHLORIDE 0.9 % WEIGHT BASED INFUSION: 1.0000 mL/kg/h | INTRAVENOUS | Status: DC

## 2018-01-31 MED ORDER — HEPARIN (PORCINE) IN NACL 1000-0.9 UT/500ML-% IV SOLN
INTRAVENOUS | Status: AC
  Filled 2018-01-31: qty 1000

## 2018-01-31 MED ORDER — ASPIRIN 81 MG PO CHEW: 81.0000 mg | CHEWABLE_TABLET | ORAL | Status: DC

## 2018-01-31 MED ORDER — SODIUM CHLORIDE 0.9% FLUSH: 3.0000 mL | Freq: Two times a day (BID) | INTRAVENOUS | Status: DC

## 2018-01-31 MED ORDER — MIDAZOLAM HCL 2 MG/2ML IJ SOLN
INTRAMUSCULAR | Status: AC
  Filled 2018-01-31: qty 2

## 2018-01-31 MED ORDER — SODIUM CHLORIDE 0.9 % IV SOLN: INTRAVENOUS | Status: AC

## 2018-01-31 MED ORDER — SODIUM CHLORIDE 0.9 % IV SOLN: 250.0000 mL | INTRAVENOUS | Status: DC | PRN

## 2018-01-31 MED ORDER — FENTANYL CITRATE (PF) 100 MCG/2ML IJ SOLN
INTRAMUSCULAR | Status: DC | PRN
  Administered 2018-01-31: 25 ug via INTRAVENOUS

## 2018-01-31 MED ORDER — VERAPAMIL HCL 2.5 MG/ML IV SOLN
INTRAVENOUS | Status: AC
  Filled 2018-01-31: qty 2

## 2018-01-31 MED ORDER — IOPAMIDOL (ISOVUE-370) INJECTION 76%
INTRAVENOUS | Status: AC
  Filled 2018-01-31: qty 100

## 2018-01-31 MED ORDER — HEPARIN SODIUM (PORCINE) 1000 UNIT/ML IJ SOLN
INTRAMUSCULAR | Status: AC
  Filled 2018-01-31: qty 1

## 2018-01-31 SURGICAL SUPPLY — 16 items
CATH 5FR JL3.5 JR4 ANG PIG MP (CATHETERS) ×2 IMPLANT
CATH BALLN WEDGE 5F 110CM (CATHETERS) ×2 IMPLANT
CATH INFINITI 5 FR 3DRC (CATHETERS) ×2 IMPLANT
CATH INFINITI 5 FR AR1 MOD (CATHETERS) ×2 IMPLANT
COVER PRB 48X5XTLSCP FOLD TPE (BAG) ×1 IMPLANT
COVER PROBE 5X48 (BAG) ×1
DEVICE RAD COMP TR BAND LRG (VASCULAR PRODUCTS) ×2 IMPLANT
GLIDESHEATH SLEND SS 6F .021 (SHEATH) ×2 IMPLANT
GUIDEWIRE INQWIRE 1.5J.035X260 (WIRE) ×1 IMPLANT
INQWIRE 1.5J .035X260CM (WIRE) ×2
KIT HEART LEFT (KITS) ×2 IMPLANT
PACK CARDIAC CATHETERIZATION (CUSTOM PROCEDURE TRAY) ×2 IMPLANT
SHEATH GLIDE SLENDER 4/5FR (SHEATH) ×2 IMPLANT
TRANSDUCER W/STOPCOCK (MISCELLANEOUS) ×2 IMPLANT
TUBING CIL FLEX 10 FLL-RA (TUBING) ×2 IMPLANT
WIRE EMERALD ST .035X150CM (WIRE) ×2 IMPLANT

## 2018-01-31 NOTE — Discharge Instructions (Signed)
Drink plenty of fluids over next 48 hours and keep right wrist elevated at heart level for 24 hours ° °Radial Site Care °Refer to this sheet in the next few weeks. These instructions provide you with information about caring for yourself after your procedure. Your health care provider may also give you more specific instructions. Your treatment has been planned according to current medical practices, but problems sometimes occur. Call your health care provider if you have any problems or questions after your procedure. °What can I expect after the procedure? °After your procedure, it is typical to have the following: °· Bruising at the radial site that usually fades within 1-2 weeks. °· Blood collecting in the tissue (hematoma) that may be painful to the touch. It should usually decrease in size and tenderness within 1-2 weeks. ° °Follow these instructions at home: °· Take medicines only as directed by your health care provider. °· You may shower 24-48 hours after the procedure or as directed by your health care provider. Remove the bandage (dressing) and gently wash the site with plain soap and water. Pat the area dry with a clean towel. Do not rub the site, because this may cause bleeding. °· Do not take baths, swim, or use a hot tub until your health care provider approves. °· Check your insertion site every day for redness, swelling, or drainage. °· Do not apply powder or lotion to the site. °· Do not flex or bend the affected arm for 24 hours or as directed by your health care provider. °· Do not push or pull heavy objects with the affected arm for 24 hours or as directed by your health care provider. °· Do not lift over 10 lb (4.5 kg) for 5 days after your procedure or as directed by your health care provider. °· Ask your health care provider when it is okay to: °? Return to work or school. °? Resume usual physical activities or sports. °? Resume sexual activity. °· Do not drive home if you are discharged the  same day as the procedure. Have someone else drive you. °· You may drive 24 hours after the procedure unless otherwise instructed by your health care provider. °· Do not operate machinery or power tools for 24 hours after the procedure. °· If your procedure was done as an outpatient procedure, which means that you went home the same day as your procedure, a responsible adult should be with you for the first 24 hours after you arrive home. °· Keep all follow-up visits as directed by your health care provider. This is important. °Contact a health care provider if: °· You have a fever. °· You have chills. °· You have increased bleeding from the radial site. Hold pressure on the site. °Get help right away if: °· You have unusual pain at the radial site. °· You have redness, warmth, or swelling at the radial site. °· You have drainage (other than a small amount of blood on the dressing) from the radial site. °· The radial site is bleeding, and the bleeding does not stop after 30 minutes of holding steady pressure on the site. °· Your arm or hand becomes pale, cool, tingly, or numb. °This information is not intended to replace advice given to you by your health care provider. Make sure you discuss any questions you have with your health care provider. °Document Released: 08/11/2010 Document Revised: 12/15/2015 Document Reviewed: 01/25/2014 °[employer] Interactive Patient Education © 2018 [employer] ° °

## 2018-01-31 NOTE — Interval H&P Note (Signed)
History and Physical Interval Note:  01/31/2018 9:05 AM  Judith Solis  has presented today for cardiac cath with the diagnosis of severe aortic stenosis  The various methods of treatment have been discussed with the patient and family. After consideration of risks, benefits and other options for treatment, the patient has consented to  Procedure(s): RIGHT/LEFT HEART CATH AND CORONARY ANGIOGRAPHY (N/A) as a surgical intervention .  The patient's history has been reviewed, patient examined, no change in status, stable for surgery.  I have reviewed the patient's chart and labs.  Questions were answered to the patient's satisfaction.    Cath Lab Visit (complete for each Cath Lab visit)  Clinical Evaluation Leading to the Procedure:   ACS: No.  Non-ACS:    Anginal Classification: CCS II  Anti-ischemic medical therapy: No Therapy  Non-Invasive Test Results: No non-invasive testing performed  Prior CABG: No previous CABG         Lauree Chandler

## 2018-01-31 NOTE — Research (Signed)
CADFEM Informed Consent   Subject Name: Judith Solis  Subject met inclusion and exclusion criteria.  The informed consent form, study requirements and expectations were reviewed with the subject and questions and concerns were addressed prior to the signing of the consent form.  The subject verbalized understanding of the trail requirements.  The subject agreed to participate in the CADFEM trial and signed the informed consent.  The informed consent was obtained prior to performance of any protocol-specific procedures for the subject.  A copy of the signed informed consent was given to the subject and a copy was placed in the subject's medical record.  [name] 01/31/2018, 8:44 AM

## 2018-02-03 ENCOUNTER — Encounter: Payer: Self-pay | Admitting: Physician Assistant

## 2018-02-03 ENCOUNTER — Other Ambulatory Visit: Payer: Self-pay | Admitting: Physician Assistant

## 2018-02-03 DIAGNOSIS — I350 Nonrheumatic aortic (valve) stenosis: Secondary | ICD-10-CM

## 2018-02-12 ENCOUNTER — Ambulatory Visit (HOSPITAL_COMMUNITY)
Admission: RE | Admit: 2018-02-12 | Discharge: 2018-02-12 | Disposition: A | Discharge status: Home / Self Care | Payer: Medicare Other | Source: Ambulatory Visit | Attending: Physician Assistant | Admitting: Physician Assistant

## 2018-02-12 ENCOUNTER — Encounter (HOSPITAL_COMMUNITY): Payer: Self-pay

## 2018-02-12 DIAGNOSIS — I350 Nonrheumatic aortic (valve) stenosis: Secondary | ICD-10-CM | POA: Diagnosis not present

## 2018-02-12 DIAGNOSIS — K5730 Diverticulosis of large intestine without perforation or abscess without bleeding: Secondary | ICD-10-CM | POA: Diagnosis not present

## 2018-02-12 DIAGNOSIS — I700 Atherosclerosis of aorta: Secondary | ICD-10-CM | POA: Diagnosis not present

## 2018-02-12 MED ORDER — IOPAMIDOL (ISOVUE-370) INJECTION 76%
100.0000 mL | Freq: Once | INTRAVENOUS | Status: AC | PRN
  Administered 2018-02-12: 100 mL via INTRAVENOUS

## 2018-02-12 MED ORDER — IOPAMIDOL (ISOVUE-370) INJECTION 76%
INTRAVENOUS | Status: AC
  Filled 2018-02-12: qty 100

## 2018-02-13 ENCOUNTER — Institutional Professional Consult (permissible substitution) (INDEPENDENT_AMBULATORY_CARE_PROVIDER_SITE_OTHER): Payer: Medicare Other | Admitting: Thoracic Surgery (Cardiothoracic Vascular Surgery)

## 2018-02-13 ENCOUNTER — Encounter: Payer: Self-pay | Admitting: Thoracic Surgery (Cardiothoracic Vascular Surgery)

## 2018-02-13 VITALS — BP 110/71 | HR 102 | Resp 16 | Ht 66.0 in | Wt 210.0 lb

## 2018-02-13 DIAGNOSIS — I350 Nonrheumatic aortic (valve) stenosis: Secondary | ICD-10-CM | POA: Diagnosis not present

## 2018-02-13 DIAGNOSIS — I351 Nonrheumatic aortic (valve) insufficiency: Secondary | ICD-10-CM

## 2018-02-13 NOTE — Patient Instructions (Signed)
Continue all previous medications without any changes at this time  

## 2018-02-13 NOTE — Progress Notes (Signed)
[facility]  CARDIOTHORACIC SURGERY CONSULTATION REPORT  Referring Provider is Judith Priest, MD PCP is Judith Hipps, MD  Chief Complaint  Patient presents with  . 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  The patient states that she has known of the presence of a heart murmur for many years.  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  This has dramatically limited her mobility over the last several years, and the patient previously was scheduled for elective total ankle replacement last year.  She underwent a transthoracic echocardiogram demonstrating the presence of moderate aortic stenosis with preserved left ventricular systolic function.  She was seen in consultation by Dr. Bettina Solis and elective ankle surgery was postponed indefinitely.  Over the past 6 months the patient has developed worsening symptoms of exertional shortness of breath.  Over the past 3 months symptoms have become limiting, and the patient now complains that she gets short of breath with moderate level activity.  She was seen in follow-up by Dr. Bettina Solis and repeat echocardiogram revealed significant progression of disease.  Peak velocity across the aortic valve measured 4.3 m/s corresponding to mean transvalvular gradient estimated 41 mmHg.  The DVI was measured 0.24 and aortic valve area calculated 0.92 cm.  Left ventricular systolic function  was mildly reduced with ejection fraction estimated 50 to 55%.  The patient subsequently underwent diagnostic cardiac catheterization confirming the presence of severe aortic stenosis with mean transvalvular gradient measured 53 mmHg corresponding to aortic valve area estimated 0.6 cm.  Patient was found to have normal coronary arteries with no significant coronary artery disease.  CT angiography was performed and the patient was referred for elective surgical consultation.  The patient is recently widowed and lives alone in [place], [place].  She has 1 adult daughter who lives fairly close by.  She had remained physically active and entirely functionally independent for most of her adult life.  She has been retired for over 5 years, having previously lived in [place].  She cared for her husband during his decline.  In the past she enjoyed walking on a regular basis, but over the last several years her mobility has decreased dramatically because of progressive degenerative arthritis and chronic pain in her left ankle.  Over the past several months she has developed progressive symptoms of exertional shortness of breath.  She gets short of breath with moderate level activity.  She denies resting shortness of breath, PND, orthopnea, or lower extremity edema.  She denies any history of chest pain or chest tightness either with activity or at rest.  She denies any history of palpitations, dizzy spells, or syncope.  Past Medical History:  Diagnosis Date  . Aortic stenosis   . Aortic valve regurgitation   . Arrhythmia   . Arthritis   . Asthma    SEASONAL  . Cancer (HCC)    LEFT BREAST CANCER  . Dysrhythmia    PVC"S  . GERD (gastroesophageal reflux disease)   .  Heart murmur   . Hyperlipidemia   . Hypertension     Past Surgical History:  Procedure Laterality Date  . ABDOMINAL HYSTERECTOMY    . BREAST SURGERY     LUMPECTOMY WITH CHEMO & RADIATION  . CATARACT EXTRACTION    . EYE SURGERY    . JOINT  REPLACEMENT     BILATERAL HIPS 2007 & 2008  . RIGHT/LEFT HEART CATH AND CORONARY ANGIOGRAPHY N/A 01/31/2018   Procedure: RIGHT/LEFT HEART CATH AND CORONARY ANGIOGRAPHY;  Surgeon: Judith Blanks, MD;  Location: [facility];  Service: Cardiovascular;  Laterality: N/A;  . ROTATOR CUFF REPAIR    . TONSILLECTOMY      Family History  Problem Relation Age of Onset  . Heart disease Neg Hx     Social History   Socioeconomic History  . Marital status: Widowed    Spouse name: Not on file  . Number of children: Not on file  . Years of education: Not on file  . Highest education level: Not on file  Occupational History  . Not on file  Social Needs  . Financial resource strain: Not on file  . Food insecurity:    Worry: Not on file    Inability: Not on file  . Transportation needs:    Medical: Not on file    Non-medical: Not on file  Tobacco Use  . Smoking status: Former Smoker    Packs/day: 1.50    Years: 20.00    Pack years: 30.00    Types: Cigarettes    Last attempt to quit: 06/04/1990    Years since quitting: 27.7  . Smokeless tobacco: Never Used  Substance and Sexual Activity  . Alcohol use: No  . Drug use: No  . Sexual activity: Not on file  Lifestyle  . Physical activity:    Days per week: Not on file    Minutes per session: Not on file  . Stress: Not on file  Relationships  . Social connections:    Talks on phone: Not on file    Gets together: Not on file    Attends religious service: Not on file    Active member of club or organization: Not on file    Attends meetings of clubs or organizations: Not on file    Relationship status: Not on file  . Intimate partner violence:    Fear of current or ex partner: Not on file    Emotionally abused: Not on file    Physically abused: Not on file    Forced sexual activity: Not on file  Other Topics Concern  . Not on file  Social History Narrative  . Not on file    Current Outpatient Medications    Medication Sig Dispense Refill  . aspirin EC 81 MG tablet Take 81 mg by mouth daily.     Marland Kitchen atorvastatin (LIPITOR) 10 MG tablet Take 10 mg by mouth at bedtime.     . Cholecalciferol (VITAMIN D PO) Take 125 mcg by mouth daily.    . citalopram (CELEXA) 20 MG tablet Take 20 mg by mouth daily.     Marland Kitchen losartan-hydrochlorothiazide (HYZAAR) 50-12.5 MG tablet TAKE 1 TABLET EVERY DAY 90 tablet 1  . magnesium gluconate (MAGONATE) 500 MG tablet Take 500 mg by mouth at bedtime.    . Multiple Vitamin (MULTI-VITAMINS) TABS Take 1 tablet by mouth daily.     . pantoprazole (PROTONIX) 40 MG tablet Take 40 mg by mouth daily.    Marland Kitchen  pramipexole (MIRAPEX) 0.125 MG tablet Take 0.125 mg by mouth every night at bedtime    . traZODone (DESYREL) 50 MG tablet Take 50 mg by mouth at bedtime.     . VENTOLIN HFA 108 (90 Base) MCG/ACT inhaler Inhale 1 puff into the lungs every 6 (six) hours as needed for wheezing or shortness of breath.      No current facility-administered medications for this visit.     Allergies  Allergen Reactions  . Oxycodone-Acetaminophen Other (See Comments)    Hallucinations  . Codeine Nausea And Vomiting      Review of Systems:   General:  Normal appetite, decreased energy, + weight gain, no weight loss, no fever  Cardiac:  no chest pain with exertion, no chest pain at rest, +SOB with exertion, no resting SOB, no PND, no orthopnea, no palpitations, no arrhythmia, no atrial fibrillation, no LE edema, no dizzy spells, no syncope  Respiratory:  + exertional shortness of breath, no home oxygen, no productive cough, no dry cough, no bronchitis, no wheezing, no hemoptysis, no asthma, no pain with inspiration or cough, no sleep apnea, no CPAP at night  GI:   no difficulty swallowing, + reflux, no frequent heartburn, no hiatal hernia, no abdominal pain, no constipation, no diarrhea, no hematochezia, no hematemesis, no melena  GU:   no dysuria,  + frequency, no urinary tract infection, no hematuria,  no enlarged prostate, no kidney stones, no kidney disease  Vascular:  no pain suggestive of claudication, no pain in feet, no leg cramps, + varicose veins, no DVT, no non-healing foot ulcer  Neuro:   no stroke, no TIA's, no seizures, no headaches, no temporary blindness one eye,  no slurred speech, no peripheral neuropathy, + chronic pain, no instability of gait, no memory/cognitive dysfunction  Musculoskeletal: + arthritis, + joint swelling, no myalgias, + difficulty walking, limited mobility   Skin:   no rash, no itching, no skin infections, no pressure sores or ulcerations  Psych:   no anxiety, + depression, no nervousness, no unusual recent stress  Eyes:   no blurry vision, + floaters, no recent vision changes, + wears glasses or contacts  ENT:   no hearing loss, no loose or painful teeth, no dentures, last saw dentist last month  Hematologic:  no easy bruising, no abnormal bleeding, no clotting disorder, no frequent epistaxis  Endocrine:  no diabetes, does not check CBG's at home           Physical Exam:   BP 110/71 (BP Location: Right Arm, Patient Position: Sitting, Cuff Size: Large)   Pulse (!) 102   Resp 16   Ht 5\' 6"  (1.676 m)   Wt 210 lb (95.3 kg)   SpO2 92% Comment: RA  BMI 33.89 kg/m   General:  Obese,  well-appearing  HEENT:  Unremarkable   Neck:   no JVD, no bruits, no adenopathy   Chest:   clear to auscultation, symmetrical breath sounds, no wheezes, no rhonchi   CV:   RRR, grade IV/VI crescendo/decrescendo murmur heard best at RSB,  no diastolic murmur  Abdomen:  soft, non-tender, no masses   Extremities:  warm, well-perfused, pulses diminished, no LE edema  Rectal/GU  Deferred  Neuro:   Grossly non-focal and symmetrical throughout  Skin:   Clean and dry, no rashes, no breakdown   Diagnostic Tests:  Transthoracic Echocardiography  Patient:    Judith Solis, Judith Solis MR #:       [id number] Study Date: 01/22/2018 Gender:  F Age:        39 Height:     167.6  cm Weight:     95.3 kg BSA:        2.14 m^2 Pt. Status: Room:   ATTENDING    Shirlee More, MD  ORDERING     Shirlee More, MD  REFERRING    Shirlee More, MD  SONOGRAPHER  Joanie Coddington, RDCS  PERFORMING   [facility] [place]  cc:  ------------------------------------------------------------------- LV EF: 50% -   55%  ------------------------------------------------------------------- Indications:      Aortic stenosis 424.1.  ------------------------------------------------------------------- History:   PMH:  PVC&'s.  Risk factors:  Hypertension. Dyslipidemia.   ------------------------------------------------------------------- Study Conclusions  - Left ventricle: The cavity size was normal. Wall thickness was   increased in a pattern of moderate LVH. Systolic function was   normal. The estimated ejection fraction was in the range of 50%   to 55%. Wall motion was normal; there were no regional wall   motion abnormalities. Doppler parameters are consistent with   abnormal left ventricular relaxation (grade 1 diastolic   dysfunction). - Aortic valve: Cusp separation was severely reduced. Valve   mobility was restricted. There was severe stenosis. There was   mild regurgitation. Valve area (VTI): 0.99 cm^2. Valve area   (Vmax): 0.92 cm^2. Valve area (Vmean): 0.97 cm^2. - Mitral valve: Mildly to moderately calcified annulus. Mildly   thickened leaflets . - Left atrium: The atrium was mildly dilated. - Pulmonary arteries: PA peak pressure: 37 mm Hg (S).  ------------------------------------------------------------------- Study data:  No prior study was available for comparison.  Study status:  Routine.  Procedure:  The patient reported no pain pre or post test. Transthoracic echocardiography. Image quality was adequate.  Study completion:  There were no complications. Transthoracic echocardiography.  M-mode, complete 2D, spectral Doppler, and color Doppler.  Birthdate:   Patient birthdate: 1946-01-26.  Age:  Patient is 72 yr old.  Sex:  Gender: female. BMI: 33.9 kg/m^2.  Blood pressure:     134/78  Patient status: Outpatient.  Study date:  Study date: 01/22/2018. Study time: 01:05 PM.  Location:  Echo laboratory.  -------------------------------------------------------------------  ------------------------------------------------------------------- Left ventricle:  The cavity size was normal. Wall thickness was increased in a pattern of moderate LVH. Systolic function was normal. The estimated ejection fraction was in the range of 50% to 55%. Wall motion was normal; there were no regional wall motion abnormalities. Doppler parameters are consistent with abnormal left ventricular relaxation (grade 1 diastolic dysfunction).  ------------------------------------------------------------------- Aortic valve:   Trileaflet; severely thickened, mildly calcified leaflets. Cusp separation was severely reduced. Valve mobility was restricted.  Doppler:   There was severe stenosis.   There was mild regurgitation.    VTI ratio of LVOT to aortic valve: 0.26. Valve area (VTI): 0.99 cm^2. Indexed valve area (VTI): 0.46 cm^2/m^2. Peak velocity ratio of LVOT to aortic valve: 0.24. Valve area (Vmax): 0.92 cm^2. Indexed valve area (Vmax): 0.43 cm^2/m^2. Mean velocity ratio of LVOT to aortic valve: 0.26. Valve area (Vmean): 0.97 cm^2. Indexed valve area (Vmean): 0.45 cm^2/m^2.    Mean gradient (S): 41 mm Hg. Peak gradient (S): 73 mm Hg.  ------------------------------------------------------------------- Aorta:  Aortic root: The aortic root was normal in size. Ascending aorta: The ascending aorta was moderately dilated.  ------------------------------------------------------------------- Mitral valve:   Mildly to moderately calcified annulus. Mildly thickened leaflets . Mobility was not restricted.  Doppler: Transvalvular velocity was within the normal range. There  was no evidence for stenosis. There was no regurgitation.  Valve area by pressure half-time: 3.01 cm^2. Indexed valve area by pressure half-time: 1.4 cm^2/m^2.    Peak gradient (D): 3 mm Hg.  ------------------------------------------------------------------- Left atrium:  The atrium was mildly dilated.  ------------------------------------------------------------------- Atrial septum:  The septum was normal.  ------------------------------------------------------------------- Right ventricle:  The cavity size was normal. Wall thickness was normal. Systolic function was normal.  ------------------------------------------------------------------- Pulmonic valve:    The valve appears to be grossly normal. Doppler:  Transvalvular velocity was within the normal range. There was no evidence for stenosis.  ------------------------------------------------------------------- Tricuspid valve:   Structurally normal valve.   Leaflet separation was normal.  Doppler:  Transvalvular velocity was within the normal range. There was mild regurgitation.  ------------------------------------------------------------------- Pulmonary artery:   The main pulmonary artery was normal-sized. Systolic pressure was within the normal range.  ------------------------------------------------------------------- Right atrium:  The atrium was normal in size.  ------------------------------------------------------------------- Pericardium:  The pericardium was normal in appearance. There was no pericardial effusion.  ------------------------------------------------------------------- Systemic veins: Inferior vena cava: The vessel was normal in size. The respirophasic diameter changes were in the normal range (>= 50%), consistent with normal central venous pressure.  ------------------------------------------------------------------- Measurements   Left ventricle                             Value          Reference  LV ID, ED, PLAX chordal                   43    mm       43 - 52  LV ID, ES, PLAX chordal                   32    mm       23 - 38  LV fx shortening, PLAX chordal    (L)     26    %        >=29  LV PW thickness, ED                       15    mm       ---------  IVS/LV PW ratio, ED                       1.07           <=1.3  Stroke volume, 2D                         90    ml       ---------  Stroke volume/bsa, 2D                     42    ml/m^2   ---------  LV e&', lateral                            7.07  cm/s     ---------  LV E/e&', lateral                          11.24          ---------  LV e&', medial  4.24  cm/s     ---------  LV E/e&', medial                           18.75          ---------  LV e&', average                            5.66  cm/s     ---------  LV E/e&', average                          14.06          ---------    Ventricular septum                        Value          Reference  IVS thickness, ED                         16    mm       ---------    LVOT                                      Value          Reference  LVOT ID, S                                22    mm       ---------  LVOT area                                 3.8   cm^2     ---------  LVOT peak velocity, S                     103   cm/s     ---------  LVOT mean velocity, S                     76.4  cm/s     ---------  LVOT VTI, S                               23.8  cm       ---------  LVOT peak gradient, S                     4     mm Hg    ---------    Aortic valve                              Value          Reference  Aortic valve peak velocity, S             427   cm/s     ---------  Aortic valve mean velocity, S             298   cm/s     ---------  Aortic valve VTI,  S                       91.7  cm       ---------  Aortic mean gradient, S                   41    mm Hg    ---------  Aortic peak gradient, S                   73    mm Hg     ---------  VTI ratio, LVOT/AV                        0.26           ---------  Aortic valve area, VTI                    0.99  cm^2     ---------  Aortic valve area/bsa, VTI                0.46  cm^2/m^2 ---------  Velocity ratio, peak, LVOT/AV             0.24           ---------  Aortic valve area, peak velocity          0.92  cm^2     ---------  Aortic valve area/bsa, peak               0.43  cm^2/m^2 ---------  velocity  Velocity ratio, mean, LVOT/AV             0.26           ---------  Aortic valve area, mean velocity          0.97  cm^2     ---------  Aortic valve area/bsa, mean               0.45  cm^2/m^2 ---------  velocity  Aortic regurg pressure half-time          370   ms       ---------    Aorta                                     Value          Reference  Aortic root ID, ED                        35    mm       ---------  Ascending aorta ID, A-P, S                37    mm       ---------    Left atrium                               Value          Reference  LA ID, A-P, ES                            41    mm       ---------  LA ID/bsa, A-P  1.91  cm/m^2   <=2.2    Mitral valve                              Value          Reference  Mitral E-wave peak velocity               79.5  cm/s     ---------  Mitral A-wave peak velocity               102   cm/s     ---------  Mitral deceleration time          (H)     250   ms       150 - 230  Mitral pressure half-time                 73    ms       ---------  Mitral peak gradient, D                   3     mm Hg    ---------  Mitral E/A ratio, peak                    0.8            ---------  Mitral valve area, PHT, DP                3.01  cm^2     ---------  Mitral valve area/bsa, PHT, DP            1.4   cm^2/m^2 ---------    Pulmonary arteries                        Value          Reference  PA pressure, S, DP                (H)     37    mm Hg    <=30    Tricuspid valve                            Value          Reference  Tricuspid regurg peak velocity            291   cm/s     ---------  Tricuspid peak RV-RA gradient             34    mm Hg    ---------    Systemic veins                            Value          Reference  Estimated CVP                             3     mm Hg    ---------    Right ventricle                           Value          Reference  TAPSE  22.6  mm       ---------  RV pressure, S, DP                (H)     37    mm Hg    <=30  RV s&', lateral, S                         9.03  cm/s     ---------  Legend: (L)  and  (H)  mark values outside specified reference range.  ------------------------------------------------------------------- Prepared and Electronically Authenticated by  Shirlee More, MD 2019-07-03T17:48:02    RIGHT/LEFT HEART CATH AND CORONARY ANGIOGRAPHY  Conclusion   1. No angiographic evidence of CAD 2. Severe aortic stenosis (mean gradient 53. 2 mmHg, peak to peak gradient 57 mmHg, AVA 0.6 cm2)  Recommendations: Will proceed with workup for TAVR vs AVR. Will have our team arrange scans and then see Dr. Cyndia Bent or Dr. Roxy Manns to review surgical AVR vs TAVR.   Indications   Severe aortic stenosis [I35.0 (ICD-10-CM)]  Procedural Details/Technique   Technical Details Indication: 72 yo female with severe AS.   Procedure: The risks, benefits, complications, treatment options, and expected outcomes were discussed with the patient. The patient and/or family concurred with the proposed plan, giving informed consent. The patient was brought to the cath lab after IV hydration was given. The patient was sedated with Versed and Fentanyl. Ultrasound guidance used to place a 5 French sheath in the right antecubital vein. Images saves and stored in the chart. Right heart cath with balloon tipped catheter. The right wrist was prepped and draped in a sterile fashion. 1% lidocaine was used for local anesthesia. Using  the modified Seldinger access technique, a 5 French sheath was placed in the right radial artery. 3 mg Verapamil was given through the sheath. 5000 units IV heparin was given. Standard diagnostic catheters were used to perform selective coronary angiography. I crossed the aortic valve with the AR1 catheter and a straight wire. LV pressures measured. No LV gram performed. The sheath was removed from the right radial artery and a Terumo hemostasis band was applied at the arteriotomy site on the right wrist.   Estimated blood loss <50 mL.  During this procedure the patient was administered the following to achieve and maintain moderate conscious sedation: Versed 1 mg, Fentanyl 25 mcg, while the patient's heart rate, blood pressure, and oxygen saturation were continuously monitored. The period of conscious sedation was 42 minutes, of which I was present face-to-face 100% of this time.  Complications   Complications documented before study signed (01/31/2018 10:45 AM EDT)    RIGHT/LEFT HEART CATH AND CORONARY ANGIOGRAPHY   None Documented by Judith Blanks, MD 01/31/2018 10:36 AM EDT  Time Range: Intraprocedure      Coronary Findings   Diagnostic  Dominance: Left  Left Anterior Descending  Vessel is large.  Left Circumflex  First Obtuse Marginal Branch  Vessel is large in size.  Right Coronary Artery  Vessel is small.  Intervention   No interventions have been documented.  Right Heart   Right Heart Pressures LV EDP is normal.  Coronary Diagrams   Diagnostic Diagram       Implants    No implant documentation for this case.  MERGE Images   Show images for CARDIAC CATHETERIZATION   Link to Procedure Log   Procedure Log    Hemo Data    Most Recent Value  Fick Cardiac Output 5.4 L/min  Fick Cardiac Output Index 2.65 (L/min)/BSA  Aortic Mean Gradient 53.16 mmHg  Aortic Peak Gradient 57 mmHg  Aortic Valve Area 0.60  Aortic Value Area Index 0.29 cm2/BSA  RA A Wave  5 mmHg  RA V Wave 3 mmHg  RA Mean 1 mmHg  RV Systolic Pressure 31 mmHg  RV Diastolic Pressure 2 mmHg  RV EDP 6 mmHg  PA Systolic Pressure 25 mmHg  PA Diastolic Pressure 10 mmHg  PA Mean 17 mmHg  PW A Wave 4 mmHg  PW V Wave 4 mmHg  PW Mean 2 mmHg  AO Systolic Pressure 425 mmHg  AO Diastolic Pressure 71 mmHg  AO Mean 97 mmHg  LV Systolic Pressure 956 mmHg  LV Diastolic Pressure 8 mmHg  LV EDP 15 mmHg  AOp Systolic Pressure 387 mmHg  AOp Diastolic Pressure 66 mmHg  AOp Mean Pressure 91 mmHg  LVp Systolic Pressure 564 mmHg  LVp Diastolic Pressure 6 mmHg  LVp EDP Pressure 15 mmHg  QP/QS 1  TPVR Index 6.42 HRUI  TSVR Index 35.51 HRUI  PVR SVR Ratio 0.16  TPVR/TSVR Ratio 0.18    Cardiac TAVR CT  TECHNIQUE: The patient was scanned on a Siemens Force 332 slice scanner. A 120 kV retrospective scan was triggered in the ascending thoracic aorta at 140 HU's. Gantry rotation speed was 250 msecs and collimation was .6 mm. No beta blockade or nitro were given. The 3D data set was reconstructed in 5% intervals of the R-R cycle. Systolic and diastolic phases were analyzed on a dedicated work station using MPR, MIP and VRT modes. The patient received 80 cc of contrast.  FINDINGS: Aortic Valve: Tri-leaflet with spherical annulus Somewhat less calcified than typically seen for TAVR.  Aorta: No aneurysm mild calcific aortic atherosclerosis  Sino-tubular Junction: 28 mm  Ascending Thoracic Aorta: 36 mm  Aortic Arch: 27 mm  Descending Thoracic Aorta: 24 mm  Sinus of Valsalva Measurements:  Non-coronary: 34.3 mm  Right - coronary: 32 mm  Left -   coronary: 31.2 mm  Coronary Artery Height above Annulus:  Left Main: 14.1 mm above annulus  Right Coronary: 18.8 mm above annulus  Virtual Basal Annulus Measurements:  Maximum / Minimum Diameter: 25.3 mm x 23.3 mm  Perimeter: 76 mm  Area: 462 mm2  Coronary Arteries: Sufficient height above annulus for  deployment  Optimum Fluoroscopic Angle for Delivery: LAO 5 degrees Cranial 16 degrees  IMPRESSION: 1. Tri leaflet AV with annulus 462 mm2 suitable for a 26 mm Sapien 3 valve  2. Optimum angiographic angle for deployment LAO 5 degrees Cranial 16 degrees  3.  Coronary arteries sufficient height above annulus for deployment  4.  Normal aortic root 3.6 cm  5.  No LAA thrombus  Jenkins Rouge   Electronically Signed   By: Jenkins Rouge M.D.   On: 02/12/2018 16:03    STS Risk Calculator  Procedure: Isolated AVR CALCULATE   Risk of Mortality:  1.055% Renal Failure:  0.572% Permanent Stroke:  0.783% Prolonged Ventilation:  4.352% DSW Infection:  0.085% Reoperation:  2.496% Morbidity or Mortality:  7.079% Short Length of Stay:  48.793% Long Length of Stay:  2.209%   Impression:  Patient has stage D severe symptomatic aortic stenosis.  She presents with progressive symptoms of exertional shortness of breath and fatigue consistent with chronic diastolic congestive heart failure, [place] Heart Association functional class IIb.  I have personally reviewed the patient's recent transthoracic echocardiogram, diagnostic cardiac  catheterization, and CT angiogram.  Echocardiogram reveals severe thickening, calcification, and restricted leaflet mobility involving all 3 leaflets of the patient's aortic valve.  Peak velocity across aortic valve measured 4.3 m/s corresponding to mean transvalvular gradient estimated 41 mmHg.  Left ventricular systolic function is mildly reduced with ejection fraction estimated 50 to 55%.  There is mild aortic insufficiency.  Diagnostic cardiac catheterization confirmed the presence of severe aortic stenosis and was notable for the absence of significant coronary artery disease.  I agree the patient needs aortic valve replacement.  Risks associated with conventional surgery are likely moderately elevated and somewhat higher than that predicted  using the STS risk calculator because of the patient's age, obesity and significantly restricted physical mobility with chronic pain in her left ankle.  Cardiac-gated CTA of the heart reveals anatomical characteristics consistent with aortic stenosis suitable for treatment by transcatheter aortic valve replacement without any significant complicating features and CTA of the aorta and iliac vessels demonstrate what appears to be adequate pelvic vascular access to facilitate a transfemoral approach.   Plan:  The patient was counseled at length regarding treatment alternatives for management of severe symptomatic aortic stenosis. Alternative approaches such as conventional aortic valve replacement, transcatheter aortic valve replacement, and continued medical therapy without intervention were compared and contrasted at length.  The risks associated with conventional surgical aortic valve replacement were discussed in detail, as were expectations for post-operative convalescence.  Alternative surgical approaches have been discussed including a comparison between conventional sternotomy and minimally-invasive techniques. Issues specific to transcatheter aortic valve replacement were discussed including questions about long term valve durability, the potential for paravalvular leak, possible increased risk of need for permanent pacemaker placement, and other technical complications related to the procedure itself.  Long-term prognosis with medical therapy was discussed. This discussion was placed in the context of the patient's own specific clinical presentation and past medical history.  All of their questions have been addressed.  The patient desires to proceed with transcatheter aortic valve replacement in the near future.  We tentatively plan for surgery on March 04, 2018.  Following the decision to proceed with transcatheter aortic valve replacement, a discussion has been held regarding what types of  management strategies would be attempted intraoperatively in the event of life-threatening complications, including the fact that the patient would be considered a candidate for the use of cardiopulmonary bypass and/or conversion to open sternotomy for attempted surgical intervention.  The patient has been advised of a variety of complications that might develop including but not limited to risks of death, stroke, paravalvular leak, aortic dissection or other major vascular complications, aortic annulus rupture, device embolization, cardiac rupture or perforation, mitral regurgitation, acute myocardial infarction, arrhythmia, heart block or bradycardia requiring permanent pacemaker placement, congestive heart failure, respiratory failure, renal failure, pneumonia, infection, other late complications related to structural valve deterioration or migration, or other complications that might ultimately cause a temporary or permanent loss of functional independence or other long term morbidity.  The patient provides full informed consent for the procedure as described and all questions were answered.   I spent in excess of 90 minutes during the conduct of this office consultation and >50% of this time involved direct face-to-face encounter with the patient for counseling and/or coordination of their care.   Valentina Gu. Roxy Manns, MD 02/13/2018 3:29 PM

## 2018-02-13 NOTE — H&P (View-Only) (Signed)
[facility]  CARDIOTHORACIC SURGERY CONSULTATION REPORT  Referring Provider is Richardo Priest, MD PCP is Ronita Hipps, MD  Chief Complaint  Patient presents with  . 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  The patient states that she has known of the presence of a heart murmur for many years.  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  This has dramatically limited her mobility over the last several years, and the patient previously was scheduled for elective total ankle replacement last year.  She underwent a transthoracic echocardiogram demonstrating the presence of moderate aortic stenosis with preserved left ventricular systolic function.  She was seen in consultation by Dr. Bettina Gavia and elective ankle surgery was postponed indefinitely.  Over the past 6 months the patient has developed worsening symptoms of exertional shortness of breath.  Over the past 3 months symptoms have become limiting, and the patient now complains that she gets short of breath with moderate level activity.  She was seen in follow-up by Dr. Bettina Gavia and repeat echocardiogram revealed significant progression of disease.  Peak velocity across the aortic valve measured 4.3 m/s corresponding to mean transvalvular gradient estimated 41 mmHg.  The DVI was measured 0.24 and aortic valve area calculated 0.92 cm.  Left ventricular systolic function  was mildly reduced with ejection fraction estimated 50 to 55%.  The patient subsequently underwent diagnostic cardiac catheterization confirming the presence of severe aortic stenosis with mean transvalvular gradient measured 53 mmHg corresponding to aortic valve area estimated 0.6 cm.  Patient was found to have normal coronary arteries with no significant coronary artery disease.  CT angiography was performed and the patient was referred for elective surgical consultation.  The patient is recently widowed and lives alone in [place], [place].  She has 1 adult daughter who lives fairly close by.  She had remained physically active and entirely functionally independent for most of her adult life.  She has been retired for over 5 years, having previously lived in [place].  She cared for her husband during his decline.  In the past she enjoyed walking on a regular basis, but over the last several years her mobility has decreased dramatically because of progressive degenerative arthritis and chronic pain in her left ankle.  Over the past several months she has developed progressive symptoms of exertional shortness of breath.  She gets short of breath with moderate level activity.  She denies resting shortness of breath, PND, orthopnea, or lower extremity edema.  She denies any history of chest pain or chest tightness either with activity or at rest.  She denies any history of palpitations, dizzy spells, or syncope.  Past Medical History:  Diagnosis Date  . Aortic stenosis   . Aortic valve regurgitation   . Arrhythmia   . Arthritis   . Asthma    SEASONAL  . Cancer (HCC)    LEFT BREAST CANCER  . Dysrhythmia    PVC"S  . GERD (gastroesophageal reflux disease)   .  Heart murmur   . Hyperlipidemia   . Hypertension     Past Surgical History:  Procedure Laterality Date  . ABDOMINAL HYSTERECTOMY    . BREAST SURGERY     LUMPECTOMY WITH CHEMO & RADIATION  . CATARACT EXTRACTION    . EYE SURGERY    . JOINT  REPLACEMENT     BILATERAL HIPS 2007 & 2008  . RIGHT/LEFT HEART CATH AND CORONARY ANGIOGRAPHY N/A 01/31/2018   Procedure: RIGHT/LEFT HEART CATH AND CORONARY ANGIOGRAPHY;  Surgeon: Burnell Blanks, MD;  Location: [facility];  Service: Cardiovascular;  Laterality: N/A;  . ROTATOR CUFF REPAIR    . TONSILLECTOMY      Family History  Problem Relation Age of Onset  . Heart disease Neg Hx     Social History   Socioeconomic History  . Marital status: Widowed    Spouse name: Not on file  . Number of children: Not on file  . Years of education: Not on file  . Highest education level: Not on file  Occupational History  . Not on file  Social Needs  . Financial resource strain: Not on file  . Food insecurity:    Worry: Not on file    Inability: Not on file  . Transportation needs:    Medical: Not on file    Non-medical: Not on file  Tobacco Use  . Smoking status: Former Smoker    Packs/day: 1.50    Years: 20.00    Pack years: 30.00    Types: Cigarettes    Last attempt to quit: 06/04/1990    Years since quitting: 27.7  . Smokeless tobacco: Never Used  Substance and Sexual Activity  . Alcohol use: No  . Drug use: No  . Sexual activity: Not on file  Lifestyle  . Physical activity:    Days per week: Not on file    Minutes per session: Not on file  . Stress: Not on file  Relationships  . Social connections:    Talks on phone: Not on file    Gets together: Not on file    Attends religious service: Not on file    Active member of club or organization: Not on file    Attends meetings of clubs or organizations: Not on file    Relationship status: Not on file  . Intimate partner violence:    Fear of current or ex partner: Not on file    Emotionally abused: Not on file    Physically abused: Not on file    Forced sexual activity: Not on file  Other Topics Concern  . Not on file  Social History Narrative  . Not on file    Current Outpatient Medications    Medication Sig Dispense Refill  . aspirin EC 81 MG tablet Take 81 mg by mouth daily.     Marland Kitchen atorvastatin (LIPITOR) 10 MG tablet Take 10 mg by mouth at bedtime.     . Cholecalciferol (VITAMIN D PO) Take 125 mcg by mouth daily.    . citalopram (CELEXA) 20 MG tablet Take 20 mg by mouth daily.     Marland Kitchen losartan-hydrochlorothiazide (HYZAAR) 50-12.5 MG tablet TAKE 1 TABLET EVERY DAY 90 tablet 1  . magnesium gluconate (MAGONATE) 500 MG tablet Take 500 mg by mouth at bedtime.    . Multiple Vitamin (MULTI-VITAMINS) TABS Take 1 tablet by mouth daily.     . pantoprazole (PROTONIX) 40 MG tablet Take 40 mg by mouth daily.    Marland Kitchen  pramipexole (MIRAPEX) 0.125 MG tablet Take 0.125 mg by mouth every night at bedtime    . traZODone (DESYREL) 50 MG tablet Take 50 mg by mouth at bedtime.     . VENTOLIN HFA 108 (90 Base) MCG/ACT inhaler Inhale 1 puff into the lungs every 6 (six) hours as needed for wheezing or shortness of breath.      No current facility-administered medications for this visit.     Allergies  Allergen Reactions  . Oxycodone-Acetaminophen Other (See Comments)    Hallucinations  . Codeine Nausea And Vomiting      Review of Systems:   General:  Normal appetite, decreased energy, + weight gain, no weight loss, no fever  Cardiac:  no chest pain with exertion, no chest pain at rest, +SOB with exertion, no resting SOB, no PND, no orthopnea, no palpitations, no arrhythmia, no atrial fibrillation, no LE edema, no dizzy spells, no syncope  Respiratory:  + exertional shortness of breath, no home oxygen, no productive cough, no dry cough, no bronchitis, no wheezing, no hemoptysis, no asthma, no pain with inspiration or cough, no sleep apnea, no CPAP at night  GI:   no difficulty swallowing, + reflux, no frequent heartburn, no hiatal hernia, no abdominal pain, no constipation, no diarrhea, no hematochezia, no hematemesis, no melena  GU:   no dysuria,  + frequency, no urinary tract infection, no hematuria,  no enlarged prostate, no kidney stones, no kidney disease  Vascular:  no pain suggestive of claudication, no pain in feet, no leg cramps, + varicose veins, no DVT, no non-healing foot ulcer  Neuro:   no stroke, no TIA's, no seizures, no headaches, no temporary blindness one eye,  no slurred speech, no peripheral neuropathy, + chronic pain, no instability of gait, no memory/cognitive dysfunction  Musculoskeletal: + arthritis, + joint swelling, no myalgias, + difficulty walking, limited mobility   Skin:   no rash, no itching, no skin infections, no pressure sores or ulcerations  Psych:   no anxiety, + depression, no nervousness, no unusual recent stress  Eyes:   no blurry vision, + floaters, no recent vision changes, + wears glasses or contacts  ENT:   no hearing loss, no loose or painful teeth, no dentures, last saw dentist last month  Hematologic:  no easy bruising, no abnormal bleeding, no clotting disorder, no frequent epistaxis  Endocrine:  no diabetes, does not check CBG's at home           Physical Exam:   BP 110/71 (BP Location: Right Arm, Patient Position: Sitting, Cuff Size: Large)   Pulse (!) 102   Resp 16   Ht 5\' 6"  (1.676 m)   Wt 210 lb (95.3 kg)   SpO2 92% Comment: RA  BMI 33.89 kg/m   General:  Obese,  well-appearing  HEENT:  Unremarkable   Neck:   no JVD, no bruits, no adenopathy   Chest:   clear to auscultation, symmetrical breath sounds, no wheezes, no rhonchi   CV:   RRR, grade IV/VI crescendo/decrescendo murmur heard best at RSB,  no diastolic murmur  Abdomen:  soft, non-tender, no masses   Extremities:  warm, well-perfused, pulses diminished, no LE edema  Rectal/GU  Deferred  Neuro:   Grossly non-focal and symmetrical throughout  Skin:   Clean and dry, no rashes, no breakdown   Diagnostic Tests:  Transthoracic Echocardiography  Patient:    Ethelyn, Cerniglia MR #:       [id number] Study Date: 01/22/2018 Gender:  F Age:        80 Height:     167.6  cm Weight:     95.3 kg BSA:        2.14 m^2 Pt. Status: Room:   ATTENDING    Shirlee More, MD  ORDERING     Shirlee More, MD  REFERRING    Shirlee More, MD  SONOGRAPHER  Joanie Coddington, RDCS  PERFORMING   [facility] [place]  cc:  ------------------------------------------------------------------- LV EF: 50% -   55%  ------------------------------------------------------------------- Indications:      Aortic stenosis 424.1.  ------------------------------------------------------------------- History:   PMH:  PVC&'s.  Risk factors:  Hypertension. Dyslipidemia.   ------------------------------------------------------------------- Study Conclusions  - Left ventricle: The cavity size was normal. Wall thickness was   increased in a pattern of moderate LVH. Systolic function was   normal. The estimated ejection fraction was in the range of 50%   to 55%. Wall motion was normal; there were no regional wall   motion abnormalities. Doppler parameters are consistent with   abnormal left ventricular relaxation (grade 1 diastolic   dysfunction). - Aortic valve: Cusp separation was severely reduced. Valve   mobility was restricted. There was severe stenosis. There was   mild regurgitation. Valve area (VTI): 0.99 cm^2. Valve area   (Vmax): 0.92 cm^2. Valve area (Vmean): 0.97 cm^2. - Mitral valve: Mildly to moderately calcified annulus. Mildly   thickened leaflets . - Left atrium: The atrium was mildly dilated. - Pulmonary arteries: PA peak pressure: 37 mm Hg (S).  ------------------------------------------------------------------- Study data:  No prior study was available for comparison.  Study status:  Routine.  Procedure:  The patient reported no pain pre or post test. Transthoracic echocardiography. Image quality was adequate.  Study completion:  There were no complications. Transthoracic echocardiography.  M-mode, complete 2D, spectral Doppler, and color Doppler.  Birthdate:   Patient birthdate: 1946-03-24.  Age:  Patient is 72 yr old.  Sex:  Gender: female. BMI: 33.9 kg/m^2.  Blood pressure:     134/78  Patient status: Outpatient.  Study date:  Study date: 01/22/2018. Study time: 01:05 PM.  Location:  Echo laboratory.  -------------------------------------------------------------------  ------------------------------------------------------------------- Left ventricle:  The cavity size was normal. Wall thickness was increased in a pattern of moderate LVH. Systolic function was normal. The estimated ejection fraction was in the range of 50% to 55%. Wall motion was normal; there were no regional wall motion abnormalities. Doppler parameters are consistent with abnormal left ventricular relaxation (grade 1 diastolic dysfunction).  ------------------------------------------------------------------- Aortic valve:   Trileaflet; severely thickened, mildly calcified leaflets. Cusp separation was severely reduced. Valve mobility was restricted.  Doppler:   There was severe stenosis.   There was mild regurgitation.    VTI ratio of LVOT to aortic valve: 0.26. Valve area (VTI): 0.99 cm^2. Indexed valve area (VTI): 0.46 cm^2/m^2. Peak velocity ratio of LVOT to aortic valve: 0.24. Valve area (Vmax): 0.92 cm^2. Indexed valve area (Vmax): 0.43 cm^2/m^2. Mean velocity ratio of LVOT to aortic valve: 0.26. Valve area (Vmean): 0.97 cm^2. Indexed valve area (Vmean): 0.45 cm^2/m^2.    Mean gradient (S): 41 mm Hg. Peak gradient (S): 73 mm Hg.  ------------------------------------------------------------------- Aorta:  Aortic root: The aortic root was normal in size. Ascending aorta: The ascending aorta was moderately dilated.  ------------------------------------------------------------------- Mitral valve:   Mildly to moderately calcified annulus. Mildly thickened leaflets . Mobility was not restricted.  Doppler: Transvalvular velocity was within the normal range. There  was no evidence for stenosis. There was no regurgitation.  Valve area by pressure half-time: 3.01 cm^2. Indexed valve area by pressure half-time: 1.4 cm^2/m^2.    Peak gradient (D): 3 mm Hg.  ------------------------------------------------------------------- Left atrium:  The atrium was mildly dilated.  ------------------------------------------------------------------- Atrial septum:  The septum was normal.  ------------------------------------------------------------------- Right ventricle:  The cavity size was normal. Wall thickness was normal. Systolic function was normal.  ------------------------------------------------------------------- Pulmonic valve:    The valve appears to be grossly normal. Doppler:  Transvalvular velocity was within the normal range. There was no evidence for stenosis.  ------------------------------------------------------------------- Tricuspid valve:   Structurally normal valve.   Leaflet separation was normal.  Doppler:  Transvalvular velocity was within the normal range. There was mild regurgitation.  ------------------------------------------------------------------- Pulmonary artery:   The main pulmonary artery was normal-sized. Systolic pressure was within the normal range.  ------------------------------------------------------------------- Right atrium:  The atrium was normal in size.  ------------------------------------------------------------------- Pericardium:  The pericardium was normal in appearance. There was no pericardial effusion.  ------------------------------------------------------------------- Systemic veins: Inferior vena cava: The vessel was normal in size. The respirophasic diameter changes were in the normal range (>= 50%), consistent with normal central venous pressure.  ------------------------------------------------------------------- Measurements   Left ventricle                             Value          Reference  LV ID, ED, PLAX chordal                   43    mm       43 - 52  LV ID, ES, PLAX chordal                   32    mm       23 - 38  LV fx shortening, PLAX chordal    (L)     26    %        >=29  LV PW thickness, ED                       15    mm       ---------  IVS/LV PW ratio, ED                       1.07           <=1.3  Stroke volume, 2D                         90    ml       ---------  Stroke volume/bsa, 2D                     42    ml/m^2   ---------  LV e&', lateral                            7.07  cm/s     ---------  LV E/e&', lateral                          11.24          ---------  LV e&', medial  4.24  cm/s     ---------  LV E/e&', medial                           18.75          ---------  LV e&', average                            5.66  cm/s     ---------  LV E/e&', average                          14.06          ---------    Ventricular septum                        Value          Reference  IVS thickness, ED                         16    mm       ---------    LVOT                                      Value          Reference  LVOT ID, S                                22    mm       ---------  LVOT area                                 3.8   cm^2     ---------  LVOT peak velocity, S                     103   cm/s     ---------  LVOT mean velocity, S                     76.4  cm/s     ---------  LVOT VTI, S                               23.8  cm       ---------  LVOT peak gradient, S                     4     mm Hg    ---------    Aortic valve                              Value          Reference  Aortic valve peak velocity, S             427   cm/s     ---------  Aortic valve mean velocity, S             298   cm/s     ---------  Aortic valve VTI,  S                       91.7  cm       ---------  Aortic mean gradient, S                   41    mm Hg    ---------  Aortic peak gradient, S                   73    mm Hg     ---------  VTI ratio, LVOT/AV                        0.26           ---------  Aortic valve area, VTI                    0.99  cm^2     ---------  Aortic valve area/bsa, VTI                0.46  cm^2/m^2 ---------  Velocity ratio, peak, LVOT/AV             0.24           ---------  Aortic valve area, peak velocity          0.92  cm^2     ---------  Aortic valve area/bsa, peak               0.43  cm^2/m^2 ---------  velocity  Velocity ratio, mean, LVOT/AV             0.26           ---------  Aortic valve area, mean velocity          0.97  cm^2     ---------  Aortic valve area/bsa, mean               0.45  cm^2/m^2 ---------  velocity  Aortic regurg pressure half-time          370   ms       ---------    Aorta                                     Value          Reference  Aortic root ID, ED                        35    mm       ---------  Ascending aorta ID, A-P, S                37    mm       ---------    Left atrium                               Value          Reference  LA ID, A-P, ES                            41    mm       ---------  LA ID/bsa, A-P  1.91  cm/m^2   <=2.2    Mitral valve                              Value          Reference  Mitral E-wave peak velocity               79.5  cm/s     ---------  Mitral A-wave peak velocity               102   cm/s     ---------  Mitral deceleration time          (H)     250   ms       150 - 230  Mitral pressure half-time                 73    ms       ---------  Mitral peak gradient, D                   3     mm Hg    ---------  Mitral E/A ratio, peak                    0.8            ---------  Mitral valve area, PHT, DP                3.01  cm^2     ---------  Mitral valve area/bsa, PHT, DP            1.4   cm^2/m^2 ---------    Pulmonary arteries                        Value          Reference  PA pressure, S, DP                (H)     37    mm Hg    <=30    Tricuspid valve                            Value          Reference  Tricuspid regurg peak velocity            291   cm/s     ---------  Tricuspid peak RV-RA gradient             34    mm Hg    ---------    Systemic veins                            Value          Reference  Estimated CVP                             3     mm Hg    ---------    Right ventricle                           Value          Reference  TAPSE  22.6  mm       ---------  RV pressure, S, DP                (H)     37    mm Hg    <=30  RV s&', lateral, S                         9.03  cm/s     ---------  Legend: (L)  and  (H)  mark values outside specified reference range.  ------------------------------------------------------------------- Prepared and Electronically Authenticated by  Shirlee More, MD 2019-07-03T17:48:02    RIGHT/LEFT HEART CATH AND CORONARY ANGIOGRAPHY  Conclusion   1. No angiographic evidence of CAD 2. Severe aortic stenosis (mean gradient 53. 2 mmHg, peak to peak gradient 57 mmHg, AVA 0.6 cm2)  Recommendations: Will proceed with workup for TAVR vs AVR. Will have our team arrange scans and then see Dr. Cyndia Bent or Dr. Roxy Manns to review surgical AVR vs TAVR.   Indications   Severe aortic stenosis [I35.0 (ICD-10-CM)]  Procedural Details/Technique   Technical Details Indication: 72 yo female with severe AS.   Procedure: The risks, benefits, complications, treatment options, and expected outcomes were discussed with the patient. The patient and/or family concurred with the proposed plan, giving informed consent. The patient was brought to the cath lab after IV hydration was given. The patient was sedated with Versed and Fentanyl. Ultrasound guidance used to place a 5 French sheath in the right antecubital vein. Images saves and stored in the chart. Right heart cath with balloon tipped catheter. The right wrist was prepped and draped in a sterile fashion. 1% lidocaine was used for local anesthesia. Using  the modified Seldinger access technique, a 5 French sheath was placed in the right radial artery. 3 mg Verapamil was given through the sheath. 5000 units IV heparin was given. Standard diagnostic catheters were used to perform selective coronary angiography. I crossed the aortic valve with the AR1 catheter and a straight wire. LV pressures measured. No LV gram performed. The sheath was removed from the right radial artery and a Terumo hemostasis band was applied at the arteriotomy site on the right wrist.   Estimated blood loss <50 mL.  During this procedure the patient was administered the following to achieve and maintain moderate conscious sedation: Versed 1 mg, Fentanyl 25 mcg, while the patient's heart rate, blood pressure, and oxygen saturation were continuously monitored. The period of conscious sedation was 42 minutes, of which I was present face-to-face 100% of this time.  Complications   Complications documented before study signed (01/31/2018 10:45 AM EDT)    RIGHT/LEFT HEART CATH AND CORONARY ANGIOGRAPHY   None Documented by Burnell Blanks, MD 01/31/2018 10:36 AM EDT  Time Range: Intraprocedure      Coronary Findings   Diagnostic  Dominance: Left  Left Anterior Descending  Vessel is large.  Left Circumflex  First Obtuse Marginal Branch  Vessel is large in size.  Right Coronary Artery  Vessel is small.  Intervention   No interventions have been documented.  Right Heart   Right Heart Pressures LV EDP is normal.  Coronary Diagrams   Diagnostic Diagram       Implants    No implant documentation for this case.  MERGE Images   Show images for CARDIAC CATHETERIZATION   Link to Procedure Log   Procedure Log    Hemo Data    Most Recent Value  Fick Cardiac Output 5.4 L/min  Fick Cardiac Output Index 2.65 (L/min)/BSA  Aortic Mean Gradient 53.16 mmHg  Aortic Peak Gradient 57 mmHg  Aortic Valve Area 0.60  Aortic Value Area Index 0.29 cm2/BSA  RA A Wave  5 mmHg  RA V Wave 3 mmHg  RA Mean 1 mmHg  RV Systolic Pressure 31 mmHg  RV Diastolic Pressure 2 mmHg  RV EDP 6 mmHg  PA Systolic Pressure 25 mmHg  PA Diastolic Pressure 10 mmHg  PA Mean 17 mmHg  PW A Wave 4 mmHg  PW V Wave 4 mmHg  PW Mean 2 mmHg  AO Systolic Pressure 950 mmHg  AO Diastolic Pressure 71 mmHg  AO Mean 97 mmHg  LV Systolic Pressure 932 mmHg  LV Diastolic Pressure 8 mmHg  LV EDP 15 mmHg  AOp Systolic Pressure 671 mmHg  AOp Diastolic Pressure 66 mmHg  AOp Mean Pressure 91 mmHg  LVp Systolic Pressure 245 mmHg  LVp Diastolic Pressure 6 mmHg  LVp EDP Pressure 15 mmHg  QP/QS 1  TPVR Index 6.42 HRUI  TSVR Index 35.51 HRUI  PVR SVR Ratio 0.16  TPVR/TSVR Ratio 0.18    Cardiac TAVR CT  TECHNIQUE: The patient was scanned on a Siemens Force 809 slice scanner. A 120 kV retrospective scan was triggered in the ascending thoracic aorta at 140 HU's. Gantry rotation speed was 250 msecs and collimation was .6 mm. No beta blockade or nitro were given. The 3D data set was reconstructed in 5% intervals of the R-R cycle. Systolic and diastolic phases were analyzed on a dedicated work station using MPR, MIP and VRT modes. The patient received 80 cc of contrast.  FINDINGS: Aortic Valve: Tri-leaflet with spherical annulus Somewhat less calcified than typically seen for TAVR.  Aorta: No aneurysm mild calcific aortic atherosclerosis  Sino-tubular Junction: 28 mm  Ascending Thoracic Aorta: 36 mm  Aortic Arch: 27 mm  Descending Thoracic Aorta: 24 mm  Sinus of Valsalva Measurements:  Non-coronary: 34.3 mm  Right - coronary: 32 mm  Left -   coronary: 31.2 mm  Coronary Artery Height above Annulus:  Left Main: 14.1 mm above annulus  Right Coronary: 18.8 mm above annulus  Virtual Basal Annulus Measurements:  Maximum / Minimum Diameter: 25.3 mm x 23.3 mm  Perimeter: 76 mm  Area: 462 mm2  Coronary Arteries: Sufficient height above annulus for  deployment  Optimum Fluoroscopic Angle for Delivery: LAO 5 degrees Cranial 16 degrees  IMPRESSION: 1. Tri leaflet AV with annulus 462 mm2 suitable for a 26 mm Sapien 3 valve  2. Optimum angiographic angle for deployment LAO 5 degrees Cranial 16 degrees  3.  Coronary arteries sufficient height above annulus for deployment  4.  Normal aortic root 3.6 cm  5.  No LAA thrombus  Jenkins Rouge   Electronically Signed   By: Jenkins Rouge M.D.   On: 02/12/2018 16:03    STS Risk Calculator  Procedure: Isolated AVR CALCULATE   Risk of Mortality:  1.055% Renal Failure:  0.572% Permanent Stroke:  0.783% Prolonged Ventilation:  4.352% DSW Infection:  0.085% Reoperation:  2.496% Morbidity or Mortality:  7.079% Short Length of Stay:  48.793% Long Length of Stay:  2.209%   Impression:  Patient has stage D severe symptomatic aortic stenosis.  She presents with progressive symptoms of exertional shortness of breath and fatigue consistent with chronic diastolic congestive heart failure, New York Heart Association functional class IIb.  I have personally reviewed the patient's recent transthoracic echocardiogram, diagnostic cardiac  catheterization, and CT angiogram.  Echocardiogram reveals severe thickening, calcification, and restricted leaflet mobility involving all 3 leaflets of the patient's aortic valve.  Peak velocity across aortic valve measured 4.3 m/s corresponding to mean transvalvular gradient estimated 41 mmHg.  Left ventricular systolic function is mildly reduced with ejection fraction estimated 50 to 55%.  There is mild aortic insufficiency.  Diagnostic cardiac catheterization confirmed the presence of severe aortic stenosis and was notable for the absence of significant coronary artery disease.  I agree the patient needs aortic valve replacement.  Risks associated with conventional surgery are likely moderately elevated and somewhat higher than that predicted  using the STS risk calculator because of the patient's age, obesity and significantly restricted physical mobility with chronic pain in her left ankle.  Cardiac-gated CTA of the heart reveals anatomical characteristics consistent with aortic stenosis suitable for treatment by transcatheter aortic valve replacement without any significant complicating features and CTA of the aorta and iliac vessels demonstrate what appears to be adequate pelvic vascular access to facilitate a transfemoral approach.   Plan:  The patient was counseled at length regarding treatment alternatives for management of severe symptomatic aortic stenosis. Alternative approaches such as conventional aortic valve replacement, transcatheter aortic valve replacement, and continued medical therapy without intervention were compared and contrasted at length.  The risks associated with conventional surgical aortic valve replacement were discussed in detail, as were expectations for post-operative convalescence.  Alternative surgical approaches have been discussed including a comparison between conventional sternotomy and minimally-invasive techniques. Issues specific to transcatheter aortic valve replacement were discussed including questions about long term valve durability, the potential for paravalvular leak, possible increased risk of need for permanent pacemaker placement, and other technical complications related to the procedure itself.  Long-term prognosis with medical therapy was discussed. This discussion was placed in the context of the patient's own specific clinical presentation and past medical history.  All of their questions have been addressed.  The patient desires to proceed with transcatheter aortic valve replacement in the near future.  We tentatively plan for surgery on March 04, 2018.  Following the decision to proceed with transcatheter aortic valve replacement, a discussion has been held regarding what types of  management strategies would be attempted intraoperatively in the event of life-threatening complications, including the fact that the patient would be considered a candidate for the use of cardiopulmonary bypass and/or conversion to open sternotomy for attempted surgical intervention.  The patient has been advised of a variety of complications that might develop including but not limited to risks of death, stroke, paravalvular leak, aortic dissection or other major vascular complications, aortic annulus rupture, device embolization, cardiac rupture or perforation, mitral regurgitation, acute myocardial infarction, arrhythmia, heart block or bradycardia requiring permanent pacemaker placement, congestive heart failure, respiratory failure, renal failure, pneumonia, infection, other late complications related to structural valve deterioration or migration, or other complications that might ultimately cause a temporary or permanent loss of functional independence or other long term morbidity.  The patient provides full informed consent for the procedure as described and all questions were answered.   I spent in excess of 90 minutes during the conduct of this office consultation and >50% of this time involved direct face-to-face encounter with the patient for counseling and/or coordination of their care.   Valentina Gu. Roxy Manns, MD 02/13/2018 3:29 PM

## 2018-02-14 ENCOUNTER — Other Ambulatory Visit: Payer: Self-pay

## 2018-02-14 DIAGNOSIS — I350 Nonrheumatic aortic (valve) stenosis: Secondary | ICD-10-CM

## 2018-02-14 DIAGNOSIS — R0609 Other forms of dyspnea: Secondary | ICD-10-CM

## 2018-02-18 ENCOUNTER — Other Ambulatory Visit: Payer: Self-pay

## 2018-02-18 DIAGNOSIS — I350 Nonrheumatic aortic (valve) stenosis: Secondary | ICD-10-CM

## 2018-02-21 ENCOUNTER — Other Ambulatory Visit: Payer: Self-pay

## 2018-02-26 NOTE — Pre-Procedure Instructions (Signed)
Ayianna Darnold  02/26/2018      [place], [place] [address] Phone: [phone] Fax: [phone]  [facility] [address] [place] - [place] [address] [place] [address] Phone: [phone] Fax: [phone]    Your procedure is scheduled on Tues., Aug. 13, 2019 from 12:52PM-2:59PM  Report to [facility] Admitting Entrance "A" at 10:30AM  Call this number if you have problems the morning of surgery:  [phone]   Remember:  Do not eat or drink after midnight on Aug. 12th    Take these medicines the morning of surgery with A SIP OF WATER: NONE  As of today, stop taking all Other Aspirin Products, Vitamins, Fish oils, and Herbal medications. Also stop all NSAIDS i.e. Advil, Ibuprofen, Motrin, Aleve, Anaprox, Naproxen, BC, Goody Powders, and all Supplements.    Do not wear jewelry, make-up or nail polish.  Do not wear lotions, powders, or perfumes, or deodorant.  Do not shave 48 hours prior to surgery.    Do not bring valuables to the hospital.  [facility] is not responsible for any belongings or valuables.  Contacts, dentures or bridgework may not be worn into surgery.  Leave your suitcase in the car.  After surgery it may be brought to your room.  For patients admitted to the hospital, discharge time will be determined by your treatment team.  Patients discharged the day of surgery will not be allowed to drive home.   Special instructions:   [facility]- Preparing For Surgery  Before surgery, you can play an important role. Because skin is not sterile, your skin needs to be as free of germs as possible. You can reduce the number of germs on your skin by washing with CHG (chlorahexidine gluconate) Soap before surgery.  CHG is an antiseptic cleaner which kills germs and bonds with the skin to continue killing germs even after washing.    Oral Hygiene is  also important to reduce your risk of infection.  Remember - BRUSH YOUR TEETH THE MORNING OF SURGERY WITH YOUR REGULAR TOOTHPASTE  Please do not use if you have an allergy to CHG or antibacterial soaps. If your skin becomes reddened/irritated stop using the CHG.  Do not shave (including legs and underarms) for at least 48 hours prior to first CHG shower. It is OK to shave your face.  Please follow these instructions carefully.   1. Shower the NIGHT BEFORE SURGERY and the MORNING OF SURGERY with CHG.   2. If you chose to wash your hair, wash your hair first as usual with your normal shampoo.  3. After you shampoo, rinse your hair and body thoroughly to remove the shampoo.  4. Use CHG as you would any other liquid soap. You can apply CHG directly to the skin and wash gently with a scrungie or a clean washcloth.   5. Apply the CHG Soap to your body ONLY FROM THE NECK DOWN.  Do not use on open wounds or open sores. Avoid contact with your eyes, ears, mouth and genitals (private parts). Wash Face and genitals (private parts)  with your normal soap.  6. Wash thoroughly, paying special attention to the area where your surgery will be performed.  7. Thoroughly rinse your body with warm water from the neck down.  8. DO NOT shower/wash with your normal soap after using and rinsing off the CHG  Soap.  9. Pat yourself dry with a CLEAN TOWEL.  10. Wear CLEAN PAJAMAS to bed the night before surgery, wear comfortable clothes the morning of surgery  11. Place CLEAN SHEETS on your bed the night of your first shower and DO NOT SLEEP WITH PETS.  Day of Surgery:  Do not apply any deodorants/lotions.  Please wear clean clothes to the hospital/surgery center.   Remember to brush your teeth WITH YOUR REGULAR TOOTHPASTE.  Please read over the following fact sheets that you were given. Pain Booklet, Coughing and Deep Breathing, MRSA Information and Surgical Site Infection Prevention

## 2018-02-27 ENCOUNTER — Other Ambulatory Visit: Payer: Self-pay

## 2018-02-27 ENCOUNTER — Ambulatory Visit: Payer: Medicare Other | Attending: Thoracic Surgery (Cardiothoracic Vascular Surgery) | Admitting: Physical Therapy

## 2018-02-27 ENCOUNTER — Encounter (HOSPITAL_COMMUNITY)
Admission: RE | Admit: 2018-02-27 | Discharge: 2018-02-27 | Disposition: A | Discharge status: Home / Self Care | Payer: Medicare Other | Source: Ambulatory Visit | Attending: Cardiovascular Disease | Admitting: Cardiovascular Disease

## 2018-02-27 ENCOUNTER — Ambulatory Visit (HOSPITAL_COMMUNITY)
Admission: RE | Admit: 2018-02-27 | Discharge: 2018-02-27 | Disposition: A | Discharge status: Home / Self Care | Payer: Medicare Other | Source: Ambulatory Visit | Attending: Thoracic Surgery (Cardiothoracic Vascular Surgery) | Admitting: Thoracic Surgery (Cardiothoracic Vascular Surgery)

## 2018-02-27 ENCOUNTER — Encounter: Payer: Self-pay | Admitting: Physical Therapy

## 2018-02-27 ENCOUNTER — Ambulatory Visit (HOSPITAL_BASED_OUTPATIENT_CLINIC_OR_DEPARTMENT_OTHER)
Admission: RE | Admit: 2018-02-27 | Discharge: 2018-02-27 | Disposition: A | Discharge status: Home / Self Care | Payer: Medicare Other | Source: Ambulatory Visit | Attending: Thoracic Surgery (Cardiothoracic Vascular Surgery) | Admitting: Thoracic Surgery (Cardiothoracic Vascular Surgery)

## 2018-02-27 DIAGNOSIS — R0609 Other forms of dyspnea: Secondary | ICD-10-CM | POA: Diagnosis not present

## 2018-02-27 DIAGNOSIS — Z01810 Encounter for preprocedural cardiovascular examination: Secondary | ICD-10-CM | POA: Insufficient documentation

## 2018-02-27 DIAGNOSIS — I10 Essential (primary) hypertension: Secondary | ICD-10-CM | POA: Insufficient documentation

## 2018-02-27 DIAGNOSIS — Z01812 Encounter for preprocedural laboratory examination: Secondary | ICD-10-CM | POA: Diagnosis not present

## 2018-02-27 DIAGNOSIS — I350 Nonrheumatic aortic (valve) stenosis: Secondary | ICD-10-CM | POA: Diagnosis not present

## 2018-02-27 DIAGNOSIS — I6523 Occlusion and stenosis of bilateral carotid arteries: Secondary | ICD-10-CM | POA: Insufficient documentation

## 2018-02-27 DIAGNOSIS — Z01818 Encounter for other preprocedural examination: Secondary | ICD-10-CM | POA: Insufficient documentation

## 2018-02-27 DIAGNOSIS — J989 Respiratory disorder, unspecified: Secondary | ICD-10-CM | POA: Diagnosis not present

## 2018-02-27 DIAGNOSIS — R2689 Other abnormalities of gait and mobility: Secondary | ICD-10-CM | POA: Diagnosis not present

## 2018-02-27 DIAGNOSIS — E785 Hyperlipidemia, unspecified: Secondary | ICD-10-CM | POA: Diagnosis not present

## 2018-02-27 DIAGNOSIS — R9431 Abnormal electrocardiogram [ECG] [EKG]: Secondary | ICD-10-CM | POA: Diagnosis not present

## 2018-02-27 DIAGNOSIS — R293 Abnormal posture: Secondary | ICD-10-CM | POA: Insufficient documentation

## 2018-02-27 LAB — PULMONARY FUNCTION TEST
DL/VA % PRED: 74 %
DL/VA: 3.76 ml/min/mmHg/L
DLCO UNC % PRED: 61 %
DLCO unc: 16.61 ml/min/mmHg
FEF 25-75 POST: 1.86 L/s
FEF 25-75 PRE: 1.64 L/s
FEF2575-%Change-Post: 13 %
FEF2575-%PRED-PRE: 90 %
FEF2575-%Pred-Post: 102 %
FEV1-%Change-Post: 3 %
FEV1-%Pred-Post: 93 %
FEV1-%Pred-Pre: 89 %
FEV1-Post: 2.17 L
FEV1-Pre: 2.09 L
FEV1FVC-%CHANGE-POST: 2 %
FEV1FVC-%PRED-PRE: 100 %
FEV6-%CHANGE-POST: 1 %
FEV6-%Pred-Post: 95 %
FEV6-%Pred-Pre: 93 %
FEV6-Post: 2.81 L
FEV6-Pre: 2.77 L
FEV6FVC-%Change-Post: 0 %
FEV6FVC-%Pred-Post: 105 %
FEV6FVC-%Pred-Pre: 105 %
FVC-%Change-Post: 1 %
FVC-%Pred-Post: 90 %
FVC-%Pred-Pre: 89 %
FVC-PRE: 2.77 L
FVC-Post: 2.82 L
Post FEV1/FVC ratio: 77 %
Post FEV6/FVC ratio: 100 %
Pre FEV1/FVC ratio: 75 %
Pre FEV6/FVC Ratio: 100 %
RV % pred: 99 %
RV: 2.36 L
TLC % PRED: 91 %
TLC: 4.91 L

## 2018-02-27 LAB — URINALYSIS, ROUTINE W REFLEX MICROSCOPIC
Bilirubin Urine: NEGATIVE
Glucose, UA: NEGATIVE mg/dL
HGB URINE DIPSTICK: NEGATIVE
KETONES UR: NEGATIVE mg/dL
Leukocytes, UA: NEGATIVE
Nitrite: NEGATIVE
Protein, ur: NEGATIVE mg/dL
SPECIFIC GRAVITY, URINE: 1.018 (ref 1.005–1.030)
pH: 6 (ref 5.0–8.0)

## 2018-02-27 LAB — TYPE AND SCREEN
ABO/RH(D): A NEG
ANTIBODY SCREEN: NEGATIVE

## 2018-02-27 LAB — BLOOD GAS, ARTERIAL
Acid-Base Excess: 0.1 mmol/L (ref 0.0–2.0)
Bicarbonate: 24.2 mmol/L (ref 20.0–28.0)
DRAWN BY: 449841
FIO2: 0.21
O2 Saturation: 98.3 %
PH ART: 7.413 (ref 7.350–7.450)
Patient temperature: 98.6
pCO2 arterial: 38.6 mmHg (ref 32.0–48.0)
pO2, Arterial: 107 mmHg (ref 83.0–108.0)

## 2018-02-27 LAB — COMPREHENSIVE METABOLIC PANEL
ALBUMIN: 4.1 g/dL (ref 3.5–5.0)
ALK PHOS: 68 U/L (ref 38–126)
ALT: 40 U/L (ref 0–44)
AST: 29 U/L (ref 15–41)
Anion gap: 11 (ref 5–15)
BUN: 19 mg/dL (ref 8–23)
CALCIUM: 9.2 mg/dL (ref 8.9–10.3)
CHLORIDE: 105 mmol/L (ref 98–111)
CO2: 22 mmol/L (ref 22–32)
Creatinine, Ser: 0.8 mg/dL (ref 0.44–1.00)
GFR calc Af Amer: >60 mL/min (ref >60)
GFR calc non Af Amer: >60 mL/min (ref >60)
GLUCOSE: 109 mg/dL — AB (ref 70–99)
Potassium: 3.8 mmol/L (ref 3.5–5.1)
Sodium: 138 mmol/L (ref 135–145)
Total Bilirubin: 1.1 mg/dL (ref 0.3–1.2)
Total Protein: 7.1 g/dL (ref 6.5–8.1)

## 2018-02-27 LAB — CBC
HCT: 41.9 % (ref 36.0–46.0)
Hemoglobin: 13.5 g/dL (ref 12.0–15.0)
MCH: 30.5 pg (ref 26.0–34.0)
MCHC: 32.2 g/dL (ref 30.0–36.0)
MCV: 94.6 fL (ref 78.0–100.0)
PLATELETS: 234 10*3/uL (ref 150–400)
RBC: 4.43 MIL/uL (ref 3.87–5.11)
RDW: 12.8 % (ref 11.5–15.5)
WBC: 6.3 10*3/uL (ref 4.0–10.5)

## 2018-02-27 LAB — PROTIME-INR
INR: 1.03
Prothrombin Time: 13.4 seconds (ref 11.4–15.2)

## 2018-02-27 LAB — SURGICAL PCR SCREEN
MRSA, PCR: NEGATIVE
Staphylococcus aureus: NEGATIVE

## 2018-02-27 LAB — ABO/RH: ABO/RH(D): A NEG

## 2018-02-27 LAB — APTT: APTT: 30 s (ref 24–36)

## 2018-02-27 LAB — HEMOGLOBIN A1C
Hgb A1c MFr Bld: 6.3 % — ABNORMAL HIGH (ref 4.8–5.6)
Mean Plasma Glucose: 134.11 mg/dL

## 2018-02-27 LAB — BRAIN NATRIURETIC PEPTIDE: B NATRIURETIC PEPTIDE 5: 63 pg/mL (ref 0.0–100.0)

## 2018-02-27 MED ORDER — ALBUTEROL SULFATE (2.5 MG/3ML) 0.083% IN NEBU
2.5000 mg | INHALATION_SOLUTION | Freq: Once | RESPIRATORY_TRACT | Status: AC
  Administered 2018-02-27: 2.5 mg via RESPIRATORY_TRACT

## 2018-02-27 NOTE — Progress Notes (Signed)
*  PRELIMINARY RESULTS* Vascular Ultrasound Carotid Duplex (Doppler) has been completed.  Preliminary findings: Velocities in both the carotid arteries are consistent with a 1-39% stenosis. Bilateral vertebral arteries demonstrate antegrade flow.   Darlina Sicilian M  02/27/2018, 1:09 PM Landry Mellow 02/27/18 1:09 PM

## 2018-02-27 NOTE — Therapy (Signed)
[place], [place], [address] Phone: [phone]   Fax:  [phone]  Physical Therapy Evaluation  Patient Details  Name: Judith Solis MRN: [id number] Date of Birth: 1946-06-13 Referring Provider: Dr. Darylene Price   Encounter Date: 02/27/2018  PT End of Session - 02/27/18 0843    Visit Number  1    PT Start Time  2025    PT Stop Time  0917    PT Time Calculation (min)  35 min       Past Medical History:  Diagnosis Date  . Aortic stenosis   . Aortic valve regurgitation   . Arrhythmia   . Arthritis   . Asthma    SEASONAL  . Cancer (HCC)    LEFT BREAST CANCER  . Dysrhythmia    PVC"S  . GERD (gastroesophageal reflux disease)   . Heart murmur   . Hyperlipidemia   . Hypertension     Past Surgical History:  Procedure Laterality Date  . ABDOMINAL HYSTERECTOMY    . BREAST SURGERY     LUMPECTOMY WITH CHEMO & RADIATION  . CATARACT EXTRACTION    . EYE SURGERY    . JOINT REPLACEMENT     BILATERAL HIPS 2007 & 2008  . RIGHT/LEFT HEART CATH AND CORONARY ANGIOGRAPHY N/A 01/31/2018   Procedure: RIGHT/LEFT HEART CATH AND CORONARY ANGIOGRAPHY;  Surgeon: Burnell Blanks, MD;  Location: [facility];  Service: Cardiovascular;  Laterality: N/A;  . ROTATOR CUFF REPAIR    . TONSILLECTOMY      There were no vitals filed for this visit.   Subjective Assessment - 02/27/18 0845    Subjective  Reports worsening shortness of breath over the past 6 months and further progressing over the past 3 months. Also reports overall fatigue and exhaustion.     Patient Stated Goals  to have ankle surgery after fixing heart and return to walking program    Currently in Pain?  No/denies         [facility] PT Assessment - 02/27/18 0001      Assessment   Medical Diagnosis  severe aortic stenosis    Referring Provider  Dr. Darylene Price    Onset Date/Surgical Date  08/30/17      Precautions   Precautions   None      Restrictions   Weight Bearing Restrictions  No      Balance Screen   Has the patient fallen in the past 6 months  No    Has the patient had a decrease in activity level because of a fear of falling?   No    Is the patient reluctant to leave their home because of a fear of falling?   No      Home Environment   Living Environment  Private residence    [place] to enter    Entrance Stairs-Number of Steps  4    Entrance Stairs-Rails  Right;Left;Can reach both      Prior Function   Level of Independence  Independent with community mobility without device      Posture/Postural Control   Posture/Postural Control  Postural limitations    Postural Limitations  Rounded Shoulders;Forward head      ROM / Strength   AROM / PROM / Strength  AROM;Strength      AROM   Overall AROM Comments  grossly WNL except L ankle limited by at least  50%      Strength   Overall Strength Comments  grossly 5/5 throughout except L ankle    Strength Assessment Site  Hand    Right/Left hand  Right;Left    Right Hand Grip (lbs)  50    Left Hand Grip (lbs)  50      Ambulation/Gait   Gait Comments  Pt ambulates with bil hip ER and mild antalgic gait due to L ankle. Pt's gait distance limited by 11% for age/gender.        [facility] Pre-Surgical Assessment - 02/27/18 0001    5 Meter Walk Test- trial 1  5 sec    5 Meter Walk Test- trial 2  4 sec.     5 Meter Walk Test- trial 3  5 sec.    5 meter walk test average  4.67 sec    4 Stage Balance Test tolerated for:   10 sec.    4 Stage Balance Test Position  2    comment  limited by left ankle    Sit To Stand Test- trial 1  12 sec.    ADL/IADL Independent with:  Bathing;Dressing;Meal prep;Finances;Yard work    6 Minute Walk- Baseline  yes    BP (mmHg)  130/90    HR (bpm)  84    02 Sat (%RA)  96 %    Modified Borg Scale for Dyspnea  0- Nothing at all    Perceived Rate of Exertion (Borg)  6-    6 Minute Walk  Post Test  yes    BP (mmHg)  (!) 150/96    HR (bpm)  112    02 Sat (%RA)  93 %    Modified Borg Scale for Dyspnea  3- Moderate shortness of breath or breathing difficulty    Perceived Rate of Exertion (Borg)  12-    Aerobic Endurance Distance Walked  1378              Objective measurements completed on examination: See above findings.              PT Education - 02/27/18 [phone]    Education Details  fall risk (due mostly to L ankle)    Person(s) Educated  Patient    Methods  Explanation    Comprehension  Verbalized understanding                  Plan - 02/27/18 1191    Clinical Impression Statement  see below    PT Frequency  One time visit    Consulted and Agree with Plan of Care  Patient      Clinical Impression Statement: Pt is a 72 yo female presenting to OP PT for evaluation prior to possible TAVR surgery due to severe aortic stenosis. Pt reports onset of shortness of breath with activity in the past 6 months and significantly worsening over the past 3 months. Symptoms are limiting her ability to walk much and causing overall fatigue. Pt presents with good ROM and strength with the exception of his L ankle, poor balance due to L ankle and is at high fall risk 4 stage balance test, good walking speed and slightly reduced aerobic endurance per 6 minute walk test. Pt ambulated a total of 1378 feet in 6 minute walk. Based on the Short Physical Performance Battery, patient has a frailty rating of 8/12 with </= 5/12 considered frail.   Patient demonstrated the following deficits and impairments:     Visit  Diagnosis: Other abnormalities of gait and mobility  Abnormal posture     Problem List Patient Active Problem List   Diagnosis Date Noted  . Severe aortic stenosis   . DDD (degenerative disc disease), cervical 09/12/2017  . Cervical spondylosis without myelopathy 08/29/2017  . Neck pain 08/29/2017  . Aortic stenosis 04/05/2017  . Aortic valve  regurgitation 04/05/2017  . Arrhythmia 04/05/2017  . Hyperlipidemia 04/05/2017  . Essential hypertension 04/05/2017  . PVCs (premature ventricular contractions) 10/21/2016    NICOLETTA,DANA, PT 02/27/2018, 9:23 AM  [facility] [address] [place], [place], [address] Phone: [phone]   Fax:  [phone]  Name: Dakayla Solis MRN: [id number] Date of Birth: [birth year]

## 2018-02-27 NOTE — Progress Notes (Signed)
PCP is Dr. Aris Everts  LOV 07/2017 Cardio is Dr. Bettina Gavia  LOV 01/2018 Echo 01/2018, cath 01/2018  Vasc/Dopplers 02/27/2018 Respiratory/ PFT's 02/27/2018

## 2018-02-27 NOTE — Pre-Procedure Instructions (Signed)
Judith Solis  02/27/2018      [facility] - [place], [place] [address] Phone: [phone] Fax: [phone]  [facility] [facility] [address] [place] - [place] [address] [place] [address] Phone: [phone] Fax: [phone]    Your procedure is scheduled on Tues., Aug. 13, 2019      Report to [facility] Admitting Entrance "A" at 10:30AM             (posted surgery time 12:52p - 2:59p)   Call this number if you have problems the morning of surgery:  [phone]   Remember:  Do not eat any foods or drink any liquids after midnight on Monday.    Take these medicines the morning of surgery with A SIP OF WATER: NONE  As of today, stop taking all Other Aspirin Products, Vitamins, Fish oils, and Herbal medications. Also stop all NSAIDS i.e. Advil, Ibuprofen, Motrin, Aleve, Anaprox, Naproxen, BC, Goody Powders, and all Supplements.    Do not wear jewelry, make-up or nail polish.  Do not wear lotions, powders, or perfumes, or deodorant.  Do not shave 48 hours prior to surgery.    Do not bring valuables to the hospital.  [facility] is not responsible for any belongings or valuables.  Contacts, dentures or bridgework may not be worn into surgery.  Leave your suitcase in the car.  After surgery it may be brought to your room.  For patients admitted to the hospital, discharge time will be determined by your treatment team.  Patients discharged the day of surgery will not be allowed to drive home.   Special instructions:   [facility]- Preparing For Surgery  Before surgery, you can play an important role. Because skin is not sterile, your skin needs to be as free of germs as possible. You can reduce the number of germs on your skin by washing with CHG (chlorahexidine gluconate) Soap before surgery.  CHG is an antiseptic cleaner which kills germs and bonds with the skin to  continue killing germs even after washing.    Oral Hygiene is also important to reduce your risk of infection.    Remember - BRUSH YOUR TEETH THE MORNING OF SURGERY WITH YOUR REGULAR TOOTHPASTE  Please do not use if you have an allergy to CHG or antibacterial soaps. If your skin becomes reddened/irritated stop using the CHG.  Do not shave (including legs and underarms) for at least 48 hours prior to first CHG shower. It is OK to shave your face.  Please follow these instructions carefully.   1. Shower the NIGHT BEFORE SURGERY and the MORNING OF SURGERY with CHG.   2. If you chose to wash your hair, wash your hair first as usual with your normal shampoo.  3. After you shampoo, rinse your hair and body thoroughly to remove the shampoo.  4. Use CHG as you would any other liquid soap. You can apply CHG directly to the skin and wash gently with a scrungie or a clean washcloth.   5. Apply the CHG Soap to your body ONLY FROM THE NECK DOWN.  Do not use on open wounds or open sores. Avoid contact with your eyes, ears, mouth and genitals (private parts). Wash Face and genitals (private parts)  with your normal soap.  6. Wash thoroughly, paying special attention to the area where your surgery will be performed.  7. Thoroughly  rinse your body with warm water from the neck down.  8. DO NOT shower/wash with your normal soap after using and rinsing off the CHG Soap.  9. Pat yourself dry with a CLEAN TOWEL.  10. Wear CLEAN PAJAMAS to bed the night before surgery, wear comfortable clothes the morning of surgery  11. Place CLEAN SHEETS on your bed the night of your first shower and DO NOT SLEEP WITH PETS.  Day of Surgery:  Do not apply any deodorants/lotions.  Please wear clean clothes to the hospital/surgery center.    Remember to brush your teeth WITH YOUR REGULAR TOOTHPASTE.  Please read over the following fact sheets that you were given. MRSA Information and Surgical Site Infection  Prevention

## 2018-03-03 MED ORDER — POTASSIUM CHLORIDE 2 MEQ/ML IV SOLN
80.0000 meq | INTRAVENOUS | Status: DC
  Filled 2018-03-03: qty 40

## 2018-03-03 MED ORDER — HEPARIN SODIUM (PORCINE) 1000 UNIT/ML IJ SOLN
INTRAMUSCULAR | Status: DC
  Filled 2018-03-03: qty 30

## 2018-03-03 MED ORDER — MAGNESIUM SULFATE 50 % IJ SOLN
40.0000 meq | INTRAMUSCULAR | Status: DC
  Filled 2018-03-03: qty 9.85

## 2018-03-03 MED ORDER — NITROGLYCERIN IN D5W 200-5 MCG/ML-% IV SOLN
2.0000 ug/min | INTRAVENOUS | Status: DC
  Filled 2018-03-03: qty 250

## 2018-03-03 MED ORDER — DEXMEDETOMIDINE HCL IN NACL 400 MCG/100ML IV SOLN
0.1000 ug/kg/h | INTRAVENOUS | Status: DC
  Filled 2018-03-03: qty 100

## 2018-03-03 MED ORDER — CEFUROXIME SODIUM 1.5 G IV SOLR
1.5000 g | INTRAVENOUS | Status: AC
  Administered 2018-03-04: 1.5 g via INTRAVENOUS
  Filled 2018-03-03: qty 1.5

## 2018-03-03 MED ORDER — NOREPINEPHRINE 4 MG/250ML-% IV SOLN
0.0000 ug/min | INTRAVENOUS | Status: DC
  Filled 2018-03-03: qty 250

## 2018-03-03 MED ORDER — SODIUM CHLORIDE 0.9 % IV SOLN
INTRAVENOUS | Status: DC
  Filled 2018-03-03: qty 1

## 2018-03-03 MED ORDER — EPINEPHRINE PF 1 MG/ML IJ SOLN
0.0000 ug/min | INTRAVENOUS | Status: DC
  Filled 2018-03-03: qty 4

## 2018-03-03 MED ORDER — SODIUM CHLORIDE 0.9 % IV SOLN
30.0000 ug/min | INTRAVENOUS | Status: DC
  Filled 2018-03-03: qty 2

## 2018-03-03 MED ORDER — DOPAMINE-DEXTROSE 3.2-5 MG/ML-% IV SOLN
0.0000 ug/kg/min | INTRAVENOUS | Status: DC
  Filled 2018-03-03: qty 250

## 2018-03-03 MED ORDER — VANCOMYCIN HCL 10 G IV SOLR
1500.0000 mg | INTRAVENOUS | Status: AC
Start: 2018-03-04 — End: 2018-03-04
  Administered 2018-03-04: 1500 mg via INTRAVENOUS
  Filled 2018-03-03: qty 1500

## 2018-03-04 ENCOUNTER — Inpatient Hospital Stay (HOSPITAL_COMMUNITY)
Admission: RE | Admit: 2018-03-04 | Discharge: 2018-03-06 | DRG: 267 | Disposition: A | Discharge status: Home / Self Care | Payer: Medicare Other | Attending: Thoracic Surgery (Cardiothoracic Vascular Surgery) | Admitting: Thoracic Surgery (Cardiothoracic Vascular Surgery)

## 2018-03-04 ENCOUNTER — Other Ambulatory Visit: Payer: Self-pay

## 2018-03-04 ENCOUNTER — Inpatient Hospital Stay (HOSPITAL_COMMUNITY): Payer: Medicare Other | Admitting: Emergency Medicine

## 2018-03-04 ENCOUNTER — Encounter (HOSPITAL_COMMUNITY)
Admission: RE | Disposition: A | Discharge status: Home / Self Care | Payer: Self-pay | Source: Ambulatory Visit | Attending: Thoracic Surgery (Cardiothoracic Vascular Surgery)

## 2018-03-04 ENCOUNTER — Inpatient Hospital Stay (HOSPITAL_COMMUNITY): Payer: Medicare Other

## 2018-03-04 ENCOUNTER — Encounter (HOSPITAL_COMMUNITY): Payer: Self-pay | Admitting: Certified Registered Nurse Anesthetist

## 2018-03-04 ENCOUNTER — Ambulatory Visit (HOSPITAL_COMMUNITY)
Admission: RE | Admit: 2018-03-04 | Discharge: 2018-03-04 | Disposition: A | Discharge status: Home / Self Care | Payer: Medicare Other | Source: Ambulatory Visit | Attending: Cardiovascular Disease | Admitting: Cardiovascular Disease

## 2018-03-04 DIAGNOSIS — E785 Hyperlipidemia, unspecified: Secondary | ICD-10-CM | POA: Diagnosis present

## 2018-03-04 DIAGNOSIS — G8929 Other chronic pain: Secondary | ICD-10-CM | POA: Diagnosis present

## 2018-03-04 DIAGNOSIS — Z96643 Presence of artificial hip joint, bilateral: Secondary | ICD-10-CM | POA: Diagnosis present

## 2018-03-04 DIAGNOSIS — Z006 Encounter for examination for normal comparison and control in clinical research program: Secondary | ICD-10-CM | POA: Diagnosis not present

## 2018-03-04 DIAGNOSIS — Z923 Personal history of irradiation: Secondary | ICD-10-CM

## 2018-03-04 DIAGNOSIS — Z6833 Body mass index (BMI) 33.0-33.9, adult: Secondary | ICD-10-CM | POA: Diagnosis not present

## 2018-03-04 DIAGNOSIS — K219 Gastro-esophageal reflux disease without esophagitis: Secondary | ICD-10-CM | POA: Diagnosis present

## 2018-03-04 DIAGNOSIS — Z853 Personal history of malignant neoplasm of breast: Secondary | ICD-10-CM

## 2018-03-04 DIAGNOSIS — Z90710 Acquired absence of both cervix and uterus: Secondary | ICD-10-CM

## 2018-03-04 DIAGNOSIS — I350 Nonrheumatic aortic (valve) stenosis: Secondary | ICD-10-CM | POA: Diagnosis not present

## 2018-03-04 DIAGNOSIS — I10 Essential (primary) hypertension: Secondary | ICD-10-CM | POA: Diagnosis not present

## 2018-03-04 DIAGNOSIS — Z87891 Personal history of nicotine dependence: Secondary | ICD-10-CM

## 2018-03-04 DIAGNOSIS — Z7982 Long term (current) use of aspirin: Secondary | ICD-10-CM | POA: Diagnosis not present

## 2018-03-04 DIAGNOSIS — J45909 Unspecified asthma, uncomplicated: Secondary | ICD-10-CM | POA: Diagnosis present

## 2018-03-04 DIAGNOSIS — Z954 Presence of other heart-valve replacement: Secondary | ICD-10-CM | POA: Diagnosis not present

## 2018-03-04 DIAGNOSIS — Z9221 Personal history of antineoplastic chemotherapy: Secondary | ICD-10-CM | POA: Diagnosis not present

## 2018-03-04 DIAGNOSIS — Z952 Presence of prosthetic heart valve: Secondary | ICD-10-CM | POA: Diagnosis not present

## 2018-03-04 DIAGNOSIS — I700 Atherosclerosis of aorta: Secondary | ICD-10-CM | POA: Diagnosis not present

## 2018-03-04 HISTORY — PX: INTRAOPERATIVE TRANSTHORACIC ECHOCARDIOGRAM: SHX6523

## 2018-03-04 HISTORY — DX: Presence of prosthetic heart valve: Z95.2

## 2018-03-04 HISTORY — DX: Nonrheumatic aortic (valve) stenosis: I35.0

## 2018-03-04 HISTORY — DX: Malignant neoplasm of unspecified site of unspecified female breast: C50.919

## 2018-03-04 HISTORY — PX: TRANSCATHETER AORTIC VALVE REPLACEMENT, TRANSFEMORAL: SHX6400

## 2018-03-04 HISTORY — DX: Morbid (severe) obesity due to excess calories: E66.01

## 2018-03-04 LAB — BASIC METABOLIC PANEL
Anion gap: 8 (ref 5–15)
BUN: 19 mg/dL (ref 8–23)
CO2: 25 mmol/L (ref 22–32)
Calcium: 8.4 mg/dL — ABNORMAL LOW (ref 8.9–10.3)
Chloride: 106 mmol/L (ref 98–111)
Creatinine, Ser: 0.82 mg/dL (ref 0.44–1.00)
GFR calc non Af Amer: >60 mL/min (ref >60)
Glucose, Bld: 118 mg/dL — ABNORMAL HIGH (ref 70–99)
Potassium: 4 mmol/L (ref 3.5–5.1)
Sodium: 139 mmol/L (ref 135–145)

## 2018-03-04 LAB — POCT I-STAT, CHEM 8
BUN: 21 mg/dL (ref 8–23)
BUN: 21 mg/dL (ref 8–23)
BUN: 21 mg/dL (ref 8–23)
CHLORIDE: 103 mmol/L (ref 98–111)
CHLORIDE: 104 mmol/L (ref 98–111)
CREATININE: 0.8 mg/dL (ref 0.44–1.00)
Calcium, Ion: 1.19 mmol/L (ref 1.15–1.40)
Calcium, Ion: 1.21 mmol/L (ref 1.15–1.40)
Calcium, Ion: 1.22 mmol/L (ref 1.15–1.40)
Chloride: 103 mmol/L (ref 98–111)
Creatinine, Ser: 0.8 mg/dL (ref 0.44–1.00)
Creatinine, Ser: 0.8 mg/dL (ref 0.44–1.00)
GLUCOSE: 106 mg/dL — AB (ref 70–99)
Glucose, Bld: 98 mg/dL (ref 70–99)
Glucose, Bld: 118 mg/dL — ABNORMAL HIGH (ref 70–99)
HCT: 32 % — ABNORMAL LOW (ref 36.0–46.0)
HCT: 33 % — ABNORMAL LOW (ref 36.0–46.0)
HEMATOCRIT: 34 % — AB (ref 36.0–46.0)
HEMOGLOBIN: 11.6 g/dL — AB (ref 12.0–15.0)
Hemoglobin: 10.9 g/dL — ABNORMAL LOW (ref 12.0–15.0)
Hemoglobin: 11.2 g/dL — ABNORMAL LOW (ref 12.0–15.0)
POTASSIUM: 4 mmol/L (ref 3.5–5.1)
POTASSIUM: 4 mmol/L (ref 3.5–5.1)
Potassium: 4.1 mmol/L (ref 3.5–5.1)
SODIUM: 140 mmol/L (ref 135–145)
Sodium: 141 mmol/L (ref 135–145)
Sodium: 141 mmol/L (ref 135–145)
TCO2: 25 mmol/L (ref 22–32)
TCO2: 26 mmol/L (ref 22–32)
TCO2: 25 mmol/L (ref 22–32)

## 2018-03-04 LAB — CBC
HCT: 36 % (ref 36.0–46.0)
Hemoglobin: 12 g/dL (ref 12.0–15.0)
MCH: 30.9 pg (ref 26.0–34.0)
MCHC: 33.3 g/dL (ref 30.0–36.0)
MCV: 92.8 fL (ref 78.0–100.0)
Platelets: 234 10*3/uL (ref 150–400)
RBC: 3.88 MIL/uL (ref 3.87–5.11)
RDW: 13 % (ref 11.5–15.5)
WBC: 6.4 10*3/uL (ref 4.0–10.5)

## 2018-03-04 LAB — MAGNESIUM: Magnesium: 2 mg/dL (ref 1.7–2.4)

## 2018-03-04 SURGERY — IMPLANTATION, AORTIC VALVE, TRANSCATHETER, FEMORAL APPROACH
Anesthesia: Monitor Anesthesia Care | Site: Chest

## 2018-03-04 MED ORDER — CHLORHEXIDINE GLUCONATE 4 % EX LIQD: 30.0000 mL | CUTANEOUS | Status: DC

## 2018-03-04 MED ORDER — ATORVASTATIN CALCIUM 10 MG PO TABS
10.0000 mg | ORAL_TABLET | Freq: Every day | ORAL | Status: DC
  Administered 2018-03-04 – 2018-03-05 (×2): 10 mg via ORAL
  Filled 2018-03-04 (×2): qty 1

## 2018-03-04 MED ORDER — ACETAMINOPHEN 650 MG RE SUPP: 650.0000 mg | Freq: Four times a day (QID) | RECTAL | Status: DC | PRN

## 2018-03-04 MED ORDER — PRAMIPEXOLE DIHYDROCHLORIDE 0.25 MG PO TABS
0.1250 mg | ORAL_TABLET | Freq: Every day | ORAL | Status: DC
  Administered 2018-03-04 – 2018-03-05 (×2): 0.125 mg via ORAL
  Filled 2018-03-04 (×3): qty 1

## 2018-03-04 MED ORDER — MORPHINE SULFATE (PF) 2 MG/ML IV SOLN: 2.0000 mg | INTRAVENOUS | Status: DC | PRN

## 2018-03-04 MED ORDER — MIDAZOLAM HCL 2 MG/2ML IJ SOLN
1.0000 mg | Freq: Once | INTRAMUSCULAR | Status: AC
Start: 2018-03-04 — End: 2018-03-04
  Administered 2018-03-04: 1 mg via INTRAVENOUS

## 2018-03-04 MED ORDER — CHLORHEXIDINE GLUCONATE 4 % EX LIQD: 60.0000 mL | Freq: Once | CUTANEOUS | Status: DC

## 2018-03-04 MED ORDER — FENTANYL CITRATE (PF) 100 MCG/2ML IJ SOLN
50.0000 ug | Freq: Once | INTRAMUSCULAR | Status: AC
  Administered 2018-03-04: 50 ug via INTRAVENOUS

## 2018-03-04 MED ORDER — LACTATED RINGERS IV SOLN
INTRAVENOUS | Status: DC | PRN
  Administered 2018-03-04 (×2): via INTRAVENOUS

## 2018-03-04 MED ORDER — HEPARIN SODIUM (PORCINE) 1000 UNIT/ML IJ SOLN
INTRAMUSCULAR | Status: DC | PRN
  Administered 2018-03-04: 16000 [IU] via INTRAVENOUS

## 2018-03-04 MED ORDER — ONDANSETRON HCL 4 MG/2ML IJ SOLN: 4.0000 mg | Freq: Four times a day (QID) | INTRAMUSCULAR | Status: DC | PRN

## 2018-03-04 MED ORDER — PANTOPRAZOLE SODIUM 40 MG PO TBEC
40.0000 mg | DELAYED_RELEASE_TABLET | Freq: Every day | ORAL | Status: DC
  Administered 2018-03-05 – 2018-03-06 (×2): 40 mg via ORAL
  Filled 2018-03-04 (×2): qty 1

## 2018-03-04 MED ORDER — HYDRALAZINE HCL 20 MG/ML IJ SOLN
10.0000 mg | Freq: Four times a day (QID) | INTRAMUSCULAR | Status: DC | PRN
  Administered 2018-03-05: 10 mg via INTRAVENOUS

## 2018-03-04 MED ORDER — METOPROLOL TARTRATE 5 MG/5ML IV SOLN
2.5000 mg | INTRAVENOUS | Status: DC | PRN
  Administered 2018-03-05: 5 mg via INTRAVENOUS
  Filled 2018-03-04: qty 5

## 2018-03-04 MED ORDER — CLOPIDOGREL BISULFATE 75 MG PO TABS
75.0000 mg | ORAL_TABLET | Freq: Every day | ORAL | Status: DC
  Administered 2018-03-05 – 2018-03-06 (×2): 75 mg via ORAL
  Filled 2018-03-04 (×2): qty 1

## 2018-03-04 MED ORDER — PROTAMINE SULFATE 10 MG/ML IV SOLN
INTRAVENOUS | Status: DC | PRN
  Administered 2018-03-04: 16 mg via INTRAVENOUS
  Administered 2018-03-04: 134 mg via INTRAVENOUS

## 2018-03-04 MED ORDER — HEPARIN SODIUM (PORCINE) 1000 UNIT/ML IJ SOLN
INTRAMUSCULAR | Status: AC
  Filled 2018-03-04: qty 1

## 2018-03-04 MED ORDER — SODIUM CHLORIDE 0.9% FLUSH
3.0000 mL | Freq: Two times a day (BID) | INTRAVENOUS | Status: DC
  Administered 2018-03-05 – 2018-03-06 (×3): 3 mL via INTRAVENOUS

## 2018-03-04 MED ORDER — ASPIRIN 81 MG PO CHEW
81.0000 mg | CHEWABLE_TABLET | Freq: Every day | ORAL | Status: DC
  Administered 2018-03-05 – 2018-03-06 (×2): 81 mg via ORAL
  Filled 2018-03-04 (×2): qty 1

## 2018-03-04 MED ORDER — OXYCODONE HCL 5 MG PO TABS: 5.0000 mg | ORAL_TABLET | ORAL | Status: DC | PRN

## 2018-03-04 MED ORDER — PROPOFOL 500 MG/50ML IV EMUL
INTRAVENOUS | Status: DC | PRN
  Administered 2018-03-04: 10 ug/kg/min via INTRAVENOUS

## 2018-03-04 MED ORDER — TRAZODONE HCL 50 MG PO TABS
50.0000 mg | ORAL_TABLET | Freq: Every day | ORAL | Status: DC
  Administered 2018-03-04 – 2018-03-05 (×2): 50 mg via ORAL
  Filled 2018-03-04 (×2): qty 1

## 2018-03-04 MED ORDER — LIDOCAINE HCL (PF) 1 % IJ SOLN
INTRAMUSCULAR | Status: DC | PRN
  Administered 2018-03-04: 7 mL via SUBCUTANEOUS

## 2018-03-04 MED ORDER — VANCOMYCIN HCL IN DEXTROSE 1-5 GM/200ML-% IV SOLN
1000.0000 mg | Freq: Once | INTRAVENOUS | Status: AC
  Administered 2018-03-05: 1000 mg via INTRAVENOUS
  Filled 2018-03-04: qty 200

## 2018-03-04 MED ORDER — HYDRALAZINE HCL 20 MG/ML IJ SOLN
INTRAMUSCULAR | Status: AC
  Administered 2018-03-05: 10 mg via INTRAVENOUS
  Filled 2018-03-04: qty 1

## 2018-03-04 MED ORDER — SODIUM CHLORIDE 0.9 % IV SOLN
1.5000 g | Freq: Two times a day (BID) | INTRAVENOUS | Status: AC
  Administered 2018-03-05 – 2018-03-06 (×4): 1.5 g via INTRAVENOUS
  Filled 2018-03-04 (×4): qty 1.5

## 2018-03-04 MED ORDER — CHLORHEXIDINE GLUCONATE 0.12 % MT SOLN
15.0000 mL | Freq: Once | OROMUCOSAL | Status: AC
  Administered 2018-03-04: 15 mL via OROMUCOSAL
  Filled 2018-03-04: qty 15

## 2018-03-04 MED ORDER — PROPOFOL 1000 MG/100ML IV EMUL
INTRAVENOUS | Status: AC
  Filled 2018-03-04: qty 100

## 2018-03-04 MED ORDER — LIDOCAINE HCL (PF) 1 % IJ SOLN
INTRAMUSCULAR | Status: AC
  Filled 2018-03-04: qty 30

## 2018-03-04 MED ORDER — NITROGLYCERIN IN D5W 200-5 MCG/ML-% IV SOLN: 0.0000 ug/min | INTRAVENOUS | Status: DC

## 2018-03-04 MED ORDER — FENTANYL CITRATE (PF) 100 MCG/2ML IJ SOLN
INTRAMUSCULAR | Status: AC
  Administered 2018-03-04: 50 ug via INTRAVENOUS
  Filled 2018-03-04: qty 2

## 2018-03-04 MED ORDER — SODIUM CHLORIDE 0.9 % IV SOLN
INTRAVENOUS | Status: DC | PRN
  Administered 2018-03-04: 1500 mL

## 2018-03-04 MED ORDER — MIDAZOLAM HCL 2 MG/2ML IJ SOLN
INTRAMUSCULAR | Status: AC
  Administered 2018-03-04: 1 mg via INTRAVENOUS
  Filled 2018-03-04: qty 2

## 2018-03-04 MED ORDER — DEXMEDETOMIDINE HCL IN NACL 400 MCG/100ML IV SOLN
INTRAVENOUS | Status: DC | PRN
  Administered 2018-03-04: 1 ug/kg/h via INTRAVENOUS

## 2018-03-04 MED ORDER — FENTANYL CITRATE (PF) 250 MCG/5ML IJ SOLN
INTRAMUSCULAR | Status: AC
Start: 2018-03-04 — End: ?
  Filled 2018-03-04: qty 5

## 2018-03-04 MED ORDER — CITALOPRAM HYDROBROMIDE 20 MG PO TABS
20.0000 mg | ORAL_TABLET | Freq: Every day | ORAL | Status: DC
  Administered 2018-03-05 – 2018-03-06 (×2): 20 mg via ORAL
  Filled 2018-03-04 (×3): qty 1

## 2018-03-04 MED ORDER — SODIUM CHLORIDE 0.9 % IV SOLN
INTRAVENOUS | Status: DC
  Administered 2018-03-04: 23:00:00 via INTRAVENOUS

## 2018-03-04 MED ORDER — TRAMADOL HCL 50 MG PO TABS
50.0000 mg | ORAL_TABLET | ORAL | Status: DC | PRN
  Administered 2018-03-05 – 2018-03-06 (×4): 100 mg via ORAL
  Filled 2018-03-04 (×4): qty 2

## 2018-03-04 MED ORDER — ONDANSETRON HCL 4 MG/2ML IJ SOLN
INTRAMUSCULAR | Status: DC | PRN
  Administered 2018-03-04: 4 mg via INTRAVENOUS

## 2018-03-04 MED ORDER — IODIXANOL 320 MG/ML IV SOLN
INTRAVENOUS | Status: DC | PRN
  Administered 2018-03-04: 150 mL via INTRAVENOUS

## 2018-03-04 MED ORDER — FENTANYL CITRATE (PF) 250 MCG/5ML IJ SOLN
INTRAMUSCULAR | Status: DC | PRN
  Administered 2018-03-04: 50 ug via INTRAVENOUS
  Administered 2018-03-04: 25 ug via INTRAVENOUS

## 2018-03-04 MED ORDER — SODIUM CHLORIDE 0.9 % IV SOLN
250.0000 mL | INTRAVENOUS | Status: DC | PRN
  Administered 2018-03-05: 250 mL via INTRAVENOUS

## 2018-03-04 MED ORDER — PHENYLEPHRINE HCL-NACL 20-0.9 MG/250ML-% IV SOLN: 0.0000 ug/min | INTRAVENOUS | Status: DC

## 2018-03-04 MED ORDER — ACETAMINOPHEN 325 MG PO TABS
650.0000 mg | ORAL_TABLET | Freq: Four times a day (QID) | ORAL | Status: DC | PRN
  Administered 2018-03-04 – 2018-03-06 (×3): 650 mg via ORAL
  Filled 2018-03-04 (×3): qty 2

## 2018-03-04 MED ORDER — SODIUM CHLORIDE 0.9 % IV SOLN
INTRAVENOUS | Status: DC
  Administered 2018-03-04: 11:00:00 via INTRAVENOUS

## 2018-03-04 MED ORDER — ONDANSETRON HCL 4 MG/2ML IJ SOLN
INTRAMUSCULAR | Status: AC
  Filled 2018-03-04: qty 2

## 2018-03-04 MED ORDER — SODIUM CHLORIDE 0.9 % IV SOLN
INTRAVENOUS | Status: AC
  Filled 2018-03-04 (×3): qty 1.2

## 2018-03-04 MED ORDER — SODIUM CHLORIDE 0.9% FLUSH: 3.0000 mL | INTRAVENOUS | Status: DC | PRN

## 2018-03-04 SURGICAL SUPPLY — 87 items
BAG DECANTER FOR FLEXI CONT (MISCELLANEOUS) IMPLANT
BAG SNAP BAND KOVER 36X36 (MISCELLANEOUS) ×5 IMPLANT
BLADE CLIPPER SURG (BLADE) IMPLANT
BLADE STERNUM SYSTEM 6 (BLADE) IMPLANT
CABLE ADAPT CONN TEMP 6FT (ADAPTER) ×5 IMPLANT
CANISTER SUCT 3000ML PPV (MISCELLANEOUS) IMPLANT
CANNULA FEM VENOUS REMOTE 22FR (CANNULA) IMPLANT
CANNULA OPTISITE PERFUSION 16F (CANNULA) IMPLANT
CANNULA OPTISITE PERFUSION 18F (CANNULA) IMPLANT
CATH DIAG EXPO 6F VENT PIG 145 (CATHETERS) ×20 IMPLANT
CATH EXPO 5FR AL1 (CATHETERS) ×5 IMPLANT
CATH INFINITI 6F AL2 (CATHETERS) IMPLANT
CATH S G BIP PACING (SET/KITS/TRAYS/PACK) ×5 IMPLANT
CLIP VESOCCLUDE MED 24/CT (CLIP) ×5 IMPLANT
CLIP VESOCCLUDE SM WIDE 24/CT (CLIP) ×5 IMPLANT
CONT SPEC 4OZ CLIKSEAL STRL BL (MISCELLANEOUS) ×10 IMPLANT
COVER BACK TABLE 80X110 HD (DRAPES) ×10 IMPLANT
COVER DOME SNAP 22 D (MISCELLANEOUS) IMPLANT
CRADLE DONUT ADULT HEAD (MISCELLANEOUS) ×5 IMPLANT
DERMABOND ADVANCED (GAUZE/BANDAGES/DRESSINGS) ×2
DERMABOND ADVANCED .7 DNX12 (GAUZE/BANDAGES/DRESSINGS) ×3 IMPLANT
DEVICE CLOSURE PERCLS PRGLD 6F (VASCULAR PRODUCTS) ×6 IMPLANT
DRAPE INCISE IOBAN 66X45 STRL (DRAPES) IMPLANT
DRSG TEGADERM 4X4.75 (GAUZE/BANDAGES/DRESSINGS) ×5 IMPLANT
ELECT CAUTERY BLADE 6.4 (BLADE) IMPLANT
ELECT REM PT RETURN 9FT ADLT (ELECTROSURGICAL) ×10
ELECTRODE REM PT RTRN 9FT ADLT (ELECTROSURGICAL) ×6 IMPLANT
FELT TEFLON 6X6 (MISCELLANEOUS) ×5 IMPLANT
FEMORAL VENOUS CANN RAP (CANNULA) IMPLANT
GAUZE SPONGE 2X2 8PLY STRL LF (GAUZE/BANDAGES/DRESSINGS) ×3 IMPLANT
GAUZE SPONGE 4X4 12PLY STRL (GAUZE/BANDAGES/DRESSINGS) ×5 IMPLANT
GLOVE BIO SURGEON STRL SZ7.5 (GLOVE) IMPLANT
GLOVE BIO SURGEON STRL SZ8 (GLOVE) IMPLANT
GLOVE EUDERMIC 7 POWDERFREE (GLOVE) IMPLANT
GLOVE ORTHO TXT STRL SZ7.5 (GLOVE) IMPLANT
GOWN STRL REUS W/ TWL LRG LVL3 (GOWN DISPOSABLE) IMPLANT
GOWN STRL REUS W/ TWL XL LVL3 (GOWN DISPOSABLE) ×3 IMPLANT
GOWN STRL REUS W/TWL LRG LVL3 (GOWN DISPOSABLE)
GOWN STRL REUS W/TWL XL LVL3 (GOWN DISPOSABLE) ×2
GUIDEWIRE SAF TJ AMPL .035X180 (WIRE) ×5 IMPLANT
GUIDEWIRE SAFE TJ AMPLATZ EXST (WIRE) ×5 IMPLANT
GUIDEWIRE STRAIGHT .035 260CM (WIRE) ×5 IMPLANT
INSERT FOGARTY SM (MISCELLANEOUS) IMPLANT
KIT BASIN OR (CUSTOM PROCEDURE TRAY) ×5 IMPLANT
KIT DILATOR VASC 18G NDL (KITS) IMPLANT
KIT HEART LEFT (KITS) ×5 IMPLANT
KIT SUCTION CATH 14FR (SUCTIONS) ×10 IMPLANT
KIT TURNOVER KIT B (KITS) ×5 IMPLANT
LOOP VESSEL MAXI BLUE (MISCELLANEOUS) IMPLANT
LOOP VESSEL MINI RED (MISCELLANEOUS) IMPLANT
NEEDLE PERC 18GX7CM (NEEDLE) ×5 IMPLANT
NS IRRIG 1000ML POUR BTL (IV SOLUTION) ×15 IMPLANT
PACK ENDOVASCULAR (PACKS) ×5 IMPLANT
PAD ARMBOARD 7.5X6 YLW CONV (MISCELLANEOUS) ×10 IMPLANT
PAD ELECT DEFIB RADIOL ZOLL (MISCELLANEOUS) ×5 IMPLANT
PENCIL BUTTON HOLSTER BLD 10FT (ELECTRODE) ×5 IMPLANT
PERCLOSE PROGLIDE 6F (VASCULAR PRODUCTS) ×10
SET MICROPUNCTURE 5F STIFF (MISCELLANEOUS) ×5 IMPLANT
SHEATH BRITE TIP 6FR 35CM (SHEATH) ×5 IMPLANT
SHEATH PINNACLE 6F 10CM (SHEATH) IMPLANT
SHEATH PINNACLE 8F 10CM (SHEATH) ×5 IMPLANT
SLEEVE REPOSITIONING LENGTH 30 (MISCELLANEOUS) ×10 IMPLANT
SPONGE GAUZE 2X2 STER 10/PKG (GAUZE/BANDAGES/DRESSINGS) ×2
SPONGE LAP 4X18 RFD (DISPOSABLE) ×5 IMPLANT
STOPCOCK MORSE 400PSI 3WAY (MISCELLANEOUS) ×10 IMPLANT
SUT ETHIBOND X763 2 0 SH 1 (SUTURE) IMPLANT
SUT GORETEX CV 4 TH 22 36 (SUTURE) IMPLANT
SUT GORETEX CV4 TH-18 (SUTURE) IMPLANT
SUT MNCRL AB 3-0 PS2 18 (SUTURE) IMPLANT
SUT PROLENE 5 0 C 1 36 (SUTURE) IMPLANT
SUT PROLENE 6 0 C 1 30 (SUTURE) IMPLANT
SUT SILK  1 MH (SUTURE) ×2
SUT SILK 1 MH (SUTURE) ×3 IMPLANT
SUT VIC AB 2-0 CT1 27 (SUTURE)
SUT VIC AB 2-0 CT1 TAPERPNT 27 (SUTURE) IMPLANT
SUT VIC AB 2-0 CTX 36 (SUTURE) IMPLANT
SUT VIC AB 3-0 SH 8-18 (SUTURE) IMPLANT
SYR 50ML LL SCALE MARK (SYRINGE) ×5 IMPLANT
SYR BULB IRRIGATION 50ML (SYRINGE) IMPLANT
SYR CONTROL 10ML LL (SYRINGE) IMPLANT
TOWEL GREEN STERILE (TOWEL DISPOSABLE) ×10 IMPLANT
TRANSDUCER W/STOPCOCK (MISCELLANEOUS) ×10 IMPLANT
TRAY FOLEY SLVR 14FR TEMP STAT (SET/KITS/TRAYS/PACK) IMPLANT
TUBE SUCT INTRACARD DLP 20F (MISCELLANEOUS) IMPLANT
VALVE HEART TRANSCATH SZ3 26MM (Prosthesis & Implant Heart) ×5 IMPLANT
WIRE .035 3MM-J 145CM (WIRE) ×5 IMPLANT
WIRE BENTSON .035X145CM (WIRE) ×5 IMPLANT

## 2018-03-04 NOTE — Anesthesia Preprocedure Evaluation (Signed)
Anesthesia Evaluation   Patient awake    Reviewed: Allergy & Precautions, NPO status   History of Anesthesia Complications Negative for: history of anesthetic complications  Airway Mallampati: II  TM Distance: >3 FB Neck ROM: Full    Dental  (+) Upper Dentures   Pulmonary neg shortness of breath, asthma , neg sleep apnea, neg recent URI, former smoker,    breath sounds clear to auscultation       Cardiovascular hypertension, Pt. on medications + dysrhythmias + Valvular Problems/Murmurs AS  Rhythm:Regular + Systolic murmurs    Neuro/Psych negative neurological ROS  negative psych ROS   GI/Hepatic Neg liver ROS, GERD  Medicated and Controlled,  Endo/Other  Morbid obesity  Renal/GU negative Renal ROS     Musculoskeletal  (+) Arthritis ,   Abdominal   Peds  Hematology negative hematology ROS (+)   Anesthesia Other Findings 01/22/18:  Left ventricle: The cavity size was normal. Wall thickness was   increased in a pattern of moderate LVH. Systolic function was   normal. The estimated ejection fraction was in the range of 50%   to 55%. Wall motion was normal; there were no regional wall   motion abnormalities. Doppler parameters are consistent with   abnormal left ventricular relaxation (grade 1 diastolic   dysfunction). - Aortic valve: Cusp separation was severely reduced. Valve   mobility was restricted. There was severe stenosis. There was   mild regurgitation. Valve area (VTI): 0.99 cm^2. Valve area   (Vmax): 0.92 cm^2. Valve area (Vmean): 0.97 cm^2. - Mitral valve: Mildly to moderately calcified annulus. Mildly   thickened leaflets . - Left atrium: The atrium was mildly dilated. - Pulmonary arteries: PA peak pressure: 37 mm Hg (S).  01/31/18: 1. No angiographic evidence of CAD 2. Severe aortic stenosis (mean gradient 53. 2 mmHg, peak to peak gradient 57 mmHg, AVA 0.6 cm2)  Recommendations: Will proceed with  workup for TAVR vs AVR. Will have our team arrange scans and then see Dr. Cyndia Bent or Dr. Roxy Manns to review surgical AVR vs TAVR.     Reproductive/Obstetrics                             Anesthesia Physical Anesthesia Plan  ASA: IV  Anesthesia Plan: MAC   Post-op Pain Management:    Induction:   PONV Risk Score and Plan: 2 and Ondansetron and Treatment may vary due to age or medical condition  Airway Management Planned: Nasal Cannula  Additional Equipment: Arterial line, CVP and Ultrasound Guidance Line Placement  Intra-op Plan:   Post-operative Plan:   Informed Consent: I have reviewed the patients History and Physical, chart, labs and discussed the procedure including the risks, benefits and alternatives for the proposed anesthesia with the patient or authorized representative who has indicated his/her understanding and acceptance.   Dental advisory given  Plan Discussed with: CRNA and Surgeon  Anesthesia Plan Comments:         Anesthesia Quick Evaluation

## 2018-03-04 NOTE — Anesthesia Procedure Notes (Signed)
Arterial Line Insertion Start/End8/13/2019 2:00 PM, 03/04/2018 2:15 PM Performed by: Neldon Newport, CRNA, CRNA  Patient location: Pre-op. Preanesthetic checklist: patient identified, IV checked, site marked, risks and benefits discussed, surgical consent, monitors and equipment checked, pre-op evaluation, timeout performed and anesthesia consent Lidocaine 1% used for infiltration Right, radial was placed Catheter size: 20 G Hand hygiene performed  and maximum sterile barriers used   Attempts: 1 Procedure performed without using ultrasound guided technique. Following insertion, dressing applied and Biopatch. Post procedure assessment: normal  Patient tolerated the procedure well with no immediate complications.

## 2018-03-04 NOTE — Progress Notes (Signed)
      [address]       [place] [address]             [phone]      Extubated, resting comfortably  BP (!) 172/82   Pulse 69   Temp 98.4 F (36.9 C) (Oral)   Resp 16   Ht 5\' 6"  (1.676 m)   Wt 95.3 kg   SpO2 96%   BMI 33.89 kg/m   Intake/Output Summary (Last 24 hours) at 03/04/2018 1834 Last data filed at 03/04/2018 1753 Gross per 24 hour  Intake 1200 ml  Output -  Net 1200 ml   Doing well early postop  Remo Lipps C. Roxan Hockey, MD [facility] [phone]

## 2018-03-04 NOTE — Anesthesia Procedure Notes (Signed)
Procedure Name: MAC Date/Time: 03/04/2018 4:10 PM Performed by: Mariea Clonts, CRNA Pre-anesthesia Checklist: Emergency Drugs available, Patient identified, Suction available, Patient being monitored and Timeout performed Patient Re-evaluated:Patient Re-evaluated prior to induction Oxygen Delivery Method: Simple face mask

## 2018-03-04 NOTE — Interval H&P Note (Signed)
History and Physical Interval Note:  03/04/2018 3:37 PM  Judith Solis  has presented today for surgery, with the diagnosis of Severe Aortic Stenosis  The various methods of treatment have been discussed with the patient and family. After consideration of risks, benefits and other options for treatment, the patient has consented to  Procedure(s): TRANSCATHETER AORTIC VALVE REPLACEMENT, TRANSFEMORAL (N/A) TRANSESOPHAGEAL ECHOCARDIOGRAM (TEE) (N/A) as a surgical intervention .  The patient's history has been reviewed, patient examined, no change in status, stable for surgery.  I have reviewed the patient's chart and labs.  Questions were answered to the patient's satisfaction.     Sherren Mocha

## 2018-03-04 NOTE — Addendum Note (Signed)
Addendum  created 03/04/18 1917 by Duane Boston, MD   Intraprocedure Staff edited

## 2018-03-04 NOTE — Progress Notes (Signed)
  Echocardiogram 2D Echocardiogram has been performed.  Judith Solis 03/04/2018, 5:59 PM

## 2018-03-04 NOTE — Op Note (Signed)
[facility]   MULTIDISCIPLINARY HEART VALVE TEAM   TAVR OPERATIVE NOTE   Date of Procedure:  03/04/2018  Preoperative Diagnosis: Severe Aortic Stenosis   Postoperative Diagnosis: Same   Procedure:    Transcatheter Aortic Valve Replacement - Percutaneous  Transfemoral Approach  Edwards Sapien 3 THV (size 26 mm, model # 9600TFX, serial # [id number])   Co-Surgeons:  Valentina Gu. Roxy Manns, MD, MD and Sherren Mocha, MD  Anesthesiologist:  Laurie Panda, MD  Echocardiographer:  Ena Dawley, MD  Pre-operative Echo Findings:  Severe aortic stenosis  Normal left ventricular systolic function  Post-operative Echo Findings:  No paravalvular leak  Normal left ventricular systolic function  BRIEF CLINICAL NOTE AND INDICATIONS FOR SURGERY  72 yo woman with limited physical mobility secondary to severe arthritis, obesity, and severe aortic stenosis, presents for outpatient TAVR. She has undergone preoperative studies and multidisciplinary heart team assessment, with suitable anatomy and clinical indication for TAVR via a transfemoral approach. Diagnostic cath confirmed severe aortic stenosis with a mean gradient of 53 mmHg and calculated AVA of 0.6 square cm. She has no obstructive CAD and normal LV systolic function with NYHA IIb symptoms of chronic diastolic heart failure.   During the course of the patient's preoperative work up they have been evaluated comprehensively by a multidisciplinary team of specialists coordinated through the [facility] in the [facility].  They have been demonstrated to suffer from symptomatic severe aortic stenosis as noted above. The patient has been counseled extensively as to the relative risks and benefits of all options for the treatment of severe aortic stenosis including long term medical therapy, conventional surgery for aortic valve replacement, and transcatheter aortic valve replacement.   The patient has been independently evaluated by Dr. Roxy Manns, and they are felt to be at moderate risk for conventional surgical aortic valve replacement. Based upon review of all of the patient's preoperative diagnostic tests they are felt to be candidate for transcatheter aortic valve replacement using the transfemoral approach as an alternative to high risk conventional surgery.    Following the decision to proceed with transcatheter aortic valve replacement, a discussion has been held regarding what types of management strategies would be attempted intraoperatively in the event of life-threatening complications, including whether or not the patient would be considered a candidate for the use of cardiopulmonary bypass and/or conversion to open sternotomy for attempted surgical intervention.  The patient has been advised of a variety of complications that might develop peculiar to this approach including but not limited to risks of death, stroke, paravalvular leak, aortic dissection or other major vascular complications, aortic annulus rupture, device embolization, cardiac rupture or perforation, acute myocardial infarction, arrhythmia, heart block or bradycardia requiring permanent pacemaker placement, congestive heart failure, respiratory failure, renal failure, pneumonia, infection, other late complications related to structural valve deterioration or migration, or other complications that might ultimately cause a temporary or permanent loss of functional independence or other long term morbidity.  The patient provides full informed consent for the procedure as described and all questions were answered preoperatively.  DETAILS OF THE OPERATIVE PROCEDURE  PREPARATION:   The patient is brought to the operating room on the above mentioned date and central monitoring was established by the anesthesia team including placement of a central venous catheter and radial arterial line. The patient is placed in the  supine position on the operating table.  Intravenous antibiotics are administered. The patient is monitored closely throughout the procedure under  conscious sedation.  Baseline transthoracic echocardiogram is performed. The patient's chest, abdomen, both groins, and both lower extremities are prepared and draped in a sterile manner. A time out procedure is performed.   PERIPHERAL ACCESS:   Using ultrasound guidance, femoral arterial and venous access is obtained with placement of 6 Fr sheaths on the left side. [place] images are captured and stored in the patient's chart.  A pigtail diagnostic catheter was passed through the femoral arterial sheath under fluoroscopic guidance into the aortic root.  A temporary transvenous pacemaker catheter was passed through the femoral venous sheath under fluoroscopic guidance into the right ventricle.  The pacemaker was tested to ensure stable lead placement and pacemaker capture. Aortic root angiography was performed in order to determine the optimal angiographic angle for valve deployment.  TRANSFEMORAL ACCESS:  A micropuncture technique is used to access the right femoral artery under fluoroscopic and ultrasound guidance.  2 Perclose devices are deployed at 10' and 2' positions to 'PreClose' the femoral artery. An 8 French sheath is placed and then an Amplatz Superstiff wire is advanced through the sheath. This is changed out for a 14 French transfemoral E-Sheath after progressively dilating over the Superstiff wire.  An AL-1 catheter was used to direct a straight-tip exchange length wire across the native aortic valve into the left ventricle. This was exchanged out for a pigtail catheter and position was confirmed in the LV apex. Simultaneous LV and Ao pressures were recorded.  The pigtail catheter was exchanged for an Amplatz Extra-stiff wire in the LV apex.    BALLOON AORTIC VALVULOPLASTY:  Not performed  TRANSCATHETER HEART VALVE DEPLOYMENT:  An Edwards Sapien 3  transcatheter heart valve (size 26 mm, model #9600TFX, serial #[id number]) was prepared and crimped per manufacturer's guidelines, and the proper orientation of the valve is confirmed on the [employer] delivery system. The valve was advanced through the introducer sheath using normal technique until in an appropriate position in the abdominal aorta beyond the sheath tip. The balloon was then retracted and using the fine-tuning wheel was centered on the valve. The valve was then advanced across the aortic arch using appropriate flexion of the catheter. The valve was carefully positioned across the aortic valve annulus. The Commander catheter was retracted using normal technique. Once final position of the valve has been confirmed by angiographic assessment, the valve is deployed while temporarily holding ventilation and during rapid ventricular pacing to maintain systolic blood pressure < 50 mmHg and pulse pressure < 10 mmHg. The balloon inflation is held for >3 seconds after reaching full deployment volume. Once the balloon has fully deflated the balloon is retracted into the ascending aorta and valve function is assessed using echocardiography. There is felt to be no paravalvular leak and no central aortic insufficiency.  The patient's hemodynamic recovery following valve deployment is good.  The deployment balloon and guidewire are both removed. Echo demostrated acceptable post-procedural gradients, stable mitral valve function, and no aortic insufficiency.   PROCEDURE COMPLETION:  The sheath was removed and femoral artery closure is performed using the 2 previously deployed Perclose devices.  Protamine is administered once femoral arterial repair was complete. The site is clear with no evidence of bleeding or hematoma after the sutures are tightened. The temporary pacemaker, pigtail catheters and femoral sheaths were removed with manual pressure used for hemostasis.   The patient tolerated the procedure  well and is transported to the surgical intensive care in stable condition. There were no immediate intraoperative complications. All sponge  instrument and needle counts are verified correct at completion of the operation.   The patient received a total of 60 mL of intravenous contrast during the procedure.   Sherren Mocha, MD 03/04/2018 6:17 PM

## 2018-03-04 NOTE — Significant Event (Addendum)
Venous and arterial sheaths removed by Lucina Mellow, supervised by cath lab personnel Purcell Nails. Patient tolerated removal. No bleeding or hematoma post removal. Bedrest, head of bed flat at this time.    Asante Ritacco

## 2018-03-04 NOTE — Transfer of Care (Signed)
Immediate Anesthesia Transfer of Care Note  Patient: Judith Solis  Procedure(s) Performed: TRANSCATHETER AORTIC VALVE REPLACEMENT, TRANSFEMORAL. EDWARDS 26MM SAPIEN 3 TRANSCATHETER HEART VALVE. (N/A Chest) INTRAOPERATIVE TRANSTHORACIC ECHOCARDIOGRAM  Patient Location: ICU  Anesthesia Type:MAC  Level of Consciousness: awake, patient cooperative and responds to stimulation  Airway & Oxygen Therapy: Patient Spontanous Breathing and Patient connected to nasal cannula oxygen  Post-op Assessment: Report given to RN and Post -op Vital signs reviewed and stable  Post vital signs: Reviewed and stable  Last Vitals:  Vitals Value Taken Time  BP    Temp    Pulse    Resp    SpO2      Last Pain:  Vitals:   03/04/18 1040  TempSrc: Oral         Complications: No apparent anesthesia complications

## 2018-03-04 NOTE — Anesthesia Postprocedure Evaluation (Signed)
Anesthesia Post Note  Patient: [name]  Procedure(s) Performed: TRANSCATHETER AORTIC VALVE REPLACEMENT, TRANSFEMORAL. EDWARDS 26MM SAPIEN 3 TRANSCATHETER HEART VALVE. (N/A Chest) INTRAOPERATIVE TRANSTHORACIC ECHOCARDIOGRAM     Patient location during evaluation: PACU Anesthesia Type: MAC Level of consciousness: awake and alert Pain management: pain level controlled Vital Signs Assessment: post-procedure vital signs reviewed and stable Respiratory status: spontaneous breathing and respiratory function stable Cardiovascular status: stable Postop Assessment: no apparent nausea or vomiting Anesthetic complications: no    Last Vitals:  Vitals:   03/04/18 1829 03/04/18 1830  BP:    Pulse: (!) 59 (!) 58  Resp: 18 18  Temp: 36.9 C   SpO2: 93% 94%    Last Pain:  Vitals:   03/04/18 1829  TempSrc: Oral                 Allia Wiltsey DANIEL

## 2018-03-04 NOTE — Progress Notes (Signed)
Patient interviewed in the preop area. Patient able to confirm name, DOB, procedure, allergies, npo status and 0/10 pain.   Lattie Corns, RN

## 2018-03-04 NOTE — Anesthesia Procedure Notes (Signed)
Central Venous Catheter Insertion Performed by: Oleta Mouse, MD, anesthesiologist Start/End8/13/2019 3:08 PM, 03/04/2018 3:14 PM Patient location: Pre-op. Preanesthetic checklist: patient identified, IV checked, site marked, risks and benefits discussed, surgical consent, monitors and equipment checked, pre-op evaluation, timeout performed and anesthesia consent Position: Trendelenburg Lidocaine 1% used for infiltration and patient sedated Hand hygiene performed  and maximum sterile barriers used  Catheter size: 8 Fr Total catheter length 16. Central line was placed.Double lumen Procedure performed using ultrasound guided technique. Ultrasound Notes:anatomy identified, no ultrasound evidence of intravascular and/or intraneural injection and image(s) printed for medical record Attempts: 1 Following insertion, dressing applied, line sutured and Biopatch. Post procedure assessment: blood return through all ports, free fluid flow and no air  Patient tolerated the procedure well with no immediate complications.

## 2018-03-04 NOTE — Op Note (Signed)
[facility] AND VASC[facility] CENTER   MULTIDISCIPLINARY HEART VALVE TEAM   TAVR OPERATIVE NOTE   Date of Procedure:  03/04/2018  Preoperative Diagnosis: Severe Aortic Stenosis   Postoperative Diagnosis: Same   Procedure:    Transcatheter Aortic Valve Replacement - Percutaneous Right Transfemoral Approach  Edwards Sapien 3 THV (size 26 mm, model # 9600TFX, serial # [id number])   Co-Surgeons:  Valentina Gu. 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  The patient states that she has known of the presence of a heart murmur for many years.  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  This has dramatically limited her mobility over the last several years, and the patient previously was scheduled for elective total ankle replacement last year.  She underwent a transthoracic echocardiogram demonstrating the presence of moderate aortic stenosis with preserved left ventricular systolic function.  She was seen in consultation by Dr. Bettina Gavia and elective ankle surgery was postponed indefinitely.  Over the past 6 months the patient has developed worsening symptoms of exertional shortness of breath.  Over the past 3 months symptoms have become limiting, and the  patient now complains that she gets short of breath with moderate level activity.  She was seen in follow-up by Dr. Bettina Gavia and repeat echocardiogram revealed significant progression of disease.  Peak velocity across the aortic valve measured 4.3 m/s corresponding to mean transvalvular gradient estimated 41 mmHg.  The DVI was measured 0.24 and aortic valve area calculated 0.92 cm.  Left ventricular systolic function was mildly reduced with ejection fraction estimated 50 to 55%.  The patient subsequently underwent diagnostic cardiac catheterization confirming the presence of severe aortic stenosis with mean transvalvular gradient measured 53 mmHg corresponding to aortic valve area estimated 0.6 cm.  Patient was found to have normal coronary arteries with no significant coronary artery disease.  CT angiography was performed and the patient was referred for elective surgical consultation.  During the course of the patient's preoperative work up they have been evaluated comprehensively by a multidisciplinary team of specialists coordinated through the [facility] in the [facility].  They have been demonstrated to suffer from symptomatic severe aortic stenosis as noted above. The patient has been counseled extensively as to the relative risks and benefits of all options for the treatment of severe aortic stenosis including long term medical therapy, conventional surgery for aortic valve replacement, and transcatheter aortic valve replacement.  All questions have been answered, and the patient provides full informed consent for the operation as described.   DETAILS OF THE OPERATIVE PROCEDURE  PREPARATION:    The patient is brought to the operating room on the above mentioned date and central monitoring was established by the anesthesia team including placement of a central venous line and radial arterial line. The patient is placed in the  supine position on the  operating table.  Intravenous antibiotics are administered. The patient is monitored closely throughout the procedure under conscious sedation.  Baseline transthoracic echocardiogram was performed. The patient's chest, abdomen, both groins, and both lower extremities are prepared and draped in a sterile manner. A time out procedure is performed.   PERIPHERAL ACCESS:    Using the modified Seldinger technique, femoral arterial and venous access was obtained with placement of 6 Fr sheaths on the left side.  A pigtail diagnostic catheter was passed through the left arterial sheath under fluoroscopic guidance into the aortic root.  A temporary transvenous pacemaker catheter was passed through the left femoral venous sheath under fluoroscopic guidance into the right ventricle.  The pacemaker was tested to ensure stable lead placement and pacemaker capture. Aortic root angiography was performed in order to determine the optimal angiographic angle for valve deployment.   TRANSFEMORAL ACCESS:   Percutaneous transfemoral access and sheath placement was performed by Dr. Burt Knack using ultrasound guidance.  The right common femoral artery was cannulated using a micropuncture needle and appropriate location was verified using hand injection angiogram.  A pair of [employer] Perclose percutaneous closure devices were placed and a 6 French sheath replaced into the femoral artery.  The patient was heparinized systemically and ACT verified > 250 seconds.    A 14 Fr transfemoral E-sheath was introduced into the right common femoral artery after progressively dilating over an Amplatz superstiff wire. An AL-1 catheter was used to direct a straight-tip exchange length wire across the native aortic valve into the left ventricle. This was exchanged out for a pigtail catheter and position was confirmed in the LV apex.  The pigtail catheter was exchanged for an Amplatz Extra-stiff wire in the LV apex.  Echocardiography was utilized  to confirm appropriate wire position and no sign of entanglement in the mitral subvalvular apparatus.   TRANSCATHETER HEART VALVE DEPLOYMENT:   An Edwards Sapien 3 transcatheter heart valve (size 26 mm, model #9600TFX, serial #[id number]) was prepared and crimped per manufacturer's guidelines, and the proper orientation of the valve is confirmed on the [employer] delivery system. The valve was advanced through the introducer sheath using normal technique until in an appropriate position in the abdominal aorta beyond the sheath tip. The balloon was then retracted and using the fine-tuning wheel was centered on the valve. The valve was then advanced across the aortic arch using appropriate flexion of the catheter. The valve was carefully positioned across the aortic valve annulus. The Commander catheter was retracted using normal technique. Once final position of the valve has been confirmed by angiographic assessment, the valve is deployed while temporarily holding ventilation and during rapid ventricular pacing to maintain systolic blood pressure < 50 mmHg and pulse pressure < 10 mmHg. The balloon inflation is held for >3 seconds after reaching full deployment volume. Once the balloon has fully deflated the balloon is retracted into the ascending aorta and valve function is assessed using echocardiography. There is felt to be no paravalvular leak and no central aortic insufficiency.  The patient's hemodynamic recovery following valve deployment is good.  The deployment balloon and guidewire are both removed.    PROCEDURE COMPLETION:   The sheath was removed and femoral artery closure performed by Dr Burt Knack.  Protamine was administered once femoral arterial repair was complete. The temporary pacemaker, pigtail catheters and femoral sheaths were removed with manual pressure used for hemostasis.   The patient tolerated the procedure well and is transported to the  surgical intensive care in stable  condition. There were no immediate intraoperative complications. All sponge instrument and needle counts are verified correct at completion of the operation.   No blood products were administered during the operation.  The patient received a total of 55.4 mL of intravenous contrast during the procedure.   Rexene Alberts, MD 03/04/2018 6:15 PM

## 2018-03-05 ENCOUNTER — Other Ambulatory Visit: Payer: Self-pay

## 2018-03-05 ENCOUNTER — Inpatient Hospital Stay (HOSPITAL_COMMUNITY): Payer: Medicare Other

## 2018-03-05 ENCOUNTER — Encounter (HOSPITAL_COMMUNITY): Payer: Self-pay | Admitting: Physician Assistant

## 2018-03-05 ENCOUNTER — Other Ambulatory Visit: Payer: Self-pay | Admitting: Physician Assistant

## 2018-03-05 DIAGNOSIS — Z954 Presence of other heart-valve replacement: Secondary | ICD-10-CM

## 2018-03-05 DIAGNOSIS — I10 Essential (primary) hypertension: Secondary | ICD-10-CM

## 2018-03-05 DIAGNOSIS — I350 Nonrheumatic aortic (valve) stenosis: Secondary | ICD-10-CM

## 2018-03-05 DIAGNOSIS — Z952 Presence of prosthetic heart valve: Secondary | ICD-10-CM

## 2018-03-05 LAB — BASIC METABOLIC PANEL
Anion gap: 9 (ref 5–15)
BUN: 17 mg/dL (ref 8–23)
CHLORIDE: 106 mmol/L (ref 98–111)
CO2: 24 mmol/L (ref 22–32)
CREATININE: 0.82 mg/dL (ref 0.44–1.00)
Calcium: 8.6 mg/dL — ABNORMAL LOW (ref 8.9–10.3)
GFR calc non Af Amer: >60 mL/min (ref >60)
Glucose, Bld: 119 mg/dL — ABNORMAL HIGH (ref 70–99)
POTASSIUM: 3.9 mmol/L (ref 3.5–5.1)
Sodium: 139 mmol/L (ref 135–145)

## 2018-03-05 LAB — CBC
HEMATOCRIT: 37.3 % (ref 36.0–46.0)
HEMOGLOBIN: 12.1 g/dL (ref 12.0–15.0)
MCH: 30.2 pg (ref 26.0–34.0)
MCHC: 32.4 g/dL (ref 30.0–36.0)
MCV: 93 fL (ref 78.0–100.0)
Platelets: 197 10*3/uL (ref 150–400)
RBC: 4.01 MIL/uL (ref 3.87–5.11)
RDW: 12.7 % (ref 11.5–15.5)
WBC: 6.2 10*3/uL (ref 4.0–10.5)

## 2018-03-05 LAB — POCT I-STAT, CHEM 8
BUN: 21 mg/dL (ref 8–23)
CALCIUM ION: 1.22 mmol/L (ref 1.15–1.40)
Chloride: 103 mmol/L (ref 98–111)
Creatinine, Ser: 0.8 mg/dL (ref 0.44–1.00)
Glucose, Bld: 116 mg/dL — ABNORMAL HIGH (ref 70–99)
HEMATOCRIT: 34 % — AB (ref 36.0–46.0)
HEMOGLOBIN: 11.6 g/dL — AB (ref 12.0–15.0)
Potassium: 4.1 mmol/L (ref 3.5–5.1)
SODIUM: 141 mmol/L (ref 135–145)
TCO2: 25 mmol/L (ref 22–32)

## 2018-03-05 LAB — ECHOCARDIOGRAM LIMITED
HEIGHTINCHES: 66 in
Weight: 3360 oz

## 2018-03-05 LAB — MAGNESIUM: Magnesium: 2 mg/dL (ref 1.7–2.4)

## 2018-03-05 MED ORDER — LOSARTAN POTASSIUM 50 MG PO TABS
50.0000 mg | ORAL_TABLET | Freq: Every day | ORAL | Status: DC
  Administered 2018-03-05 – 2018-03-06 (×2): 50 mg via ORAL
  Filled 2018-03-05 (×2): qty 1

## 2018-03-05 MED ORDER — LOSARTAN POTASSIUM-HCTZ 50-12.5 MG PO TABS: 1.0000 | ORAL_TABLET | Freq: Every day | ORAL | Status: DC

## 2018-03-05 MED ORDER — HYDROCHLOROTHIAZIDE 12.5 MG PO CAPS
12.5000 mg | ORAL_CAPSULE | Freq: Every day | ORAL | Status: DC
  Administered 2018-03-05 – 2018-03-06 (×2): 12.5 mg via ORAL
  Filled 2018-03-05 (×2): qty 1

## 2018-03-05 NOTE — Progress Notes (Signed)
CARDIAC REHAB PHASE I   PRE:  Rate/Rhythm: 80 SR    BP: sitting 112/66    SaO2: 97 RA  MODE:  Ambulation: 290 ft   POST:  Rate/Rhythm: 94 SR with PVCs    BP: sitting 140/65     SaO2: 97 RA  Pt moving fairly well. Did not want to use AD. Slow, fairly steady with min assist. SOB with distance but sts its improved from PTA. To bed, VSS. Discussed diet (new preDM) and CRPII with pt. Good reception, eager to lose weight. Will refer to [facility]. Hopefully she can do some of program before ankle surgery. [place], ACSM 03/05/2018 11:57 AM

## 2018-03-05 NOTE — Progress Notes (Signed)
Bedrest complete. L femoral sheath site remains a level 0 with dressing remaining clean, dry, and intact. Patient repositioned in bed, denies any pain at this time other than positional discomfort in neck from bedrest.   Will continue to monitor patient and sheath site closely.

## 2018-03-05 NOTE — Progress Notes (Signed)
  Echocardiogram 2D Echocardiogram limited S/P TAVR has been performed.  Darlina Sicilian M 03/05/2018, 9:54 AM

## 2018-03-05 NOTE — Progress Notes (Signed)
Received patient via wheelchair from [facility] after receiving report from [place]. She is accompanied by daughter and has stable vitals, CHG bath given and telemetry applied and CCMD notified. Skin intact. Only c/o of pain is HA 2/10 which is much relieved from earlier today per her report. Oriented to room and unit.

## 2018-03-05 NOTE — Progress Notes (Addendum)
[place] VALVE TEAM  Patient Name: Judith Solis Date of Encounter: 03/05/2018  Primary Cardiologist: Dr. Bettina Gavia / Dr. Burt Knack & Dr. Angelena Form (TAVR)  Hospital Problem List     Principal Problem:   S/P TAVR (transcatheter aortic valve replacement) Active Problems:   Hyperlipidemia   Essential hypertension   Severe aortic stenosis     Subjective   Feeling well. She has been up walking around earlier this am and doing well. No Cp or SOB  Inpatient Medications    Scheduled Meds: . aspirin  81 mg Oral Daily  . atorvastatin  10 mg Oral QHS  . citalopram  20 mg Oral Daily  . clopidogrel  75 mg Oral Q breakfast  . pantoprazole  40 mg Oral Daily  . pramipexole  0.125 mg Oral QHS  . sodium chloride flush  3 mL Intravenous Q12H  . traZODone  50 mg Oral QHS   Continuous Infusions: . sodium chloride Stopped (03/05/18 0317)  . cefUROXime (ZINACEF)  IV Stopped (03/05/18 0031)  . nitroGLYCERIN    . phenylephrine (NEO-SYNEPHRINE) Adult infusion     PRN Meds: sodium chloride, acetaminophen **OR** acetaminophen, hydrALAZINE, metoprolol tartrate, morphine injection, ondansetron (ZOFRAN) IV, sodium chloride flush, traMADol   Vital Signs    Vitals:   03/05/18 0700 03/05/18 0703 03/05/18 0749 03/05/18 0800  BP:  (!) 146/76    Pulse: 78 78  85  Resp: 20 17  17   Temp:   98.3 F (36.8 C)   TempSrc:   Oral   SpO2: 96% 96%  96%  Weight:      Height:        Intake/Output Summary (Last 24 hours) at 03/05/2018 0849 Last data filed at 03/05/2018 0800 Gross per 24 hour  Intake 2787.54 ml  Output 1550 ml  Net 1237.54 ml   Filed Weights   03/04/18 1040  Weight: 95.3 kg    Physical Exam    GEN: Well nourished, well developed, in no acute distress. obese HEENT: Grossly normal.  Neck: Supple, no JVD, carotid bruits, or masses. Cardiac: RRR. Soft flow murmur @ RUSB. No rubs, or gallops. No clubbing, cyanosis, edema.  Radials/DP/PT 2+ and  equal bilaterally.  Respiratory:  Respirations regular and unlabored, clear to auscultation bilaterally. GI: Soft, nontender, nondistended, BS + x 4. MS: no deformity or atrophy. Skin: warm and dry, no rash. Mild ecchymosis on right groin site Neuro:  Strength and sensation are intact. Psych: AAOx3.  Normal affect.  Labs    CBC Recent Labs    03/04/18 1827 03/05/18 0330  WBC 6.4 6.2  HGB 12.0 12.1  HCT 36.0 37.3  MCV 92.8 93.0  PLT 234 678   Basic Metabolic Panel Recent Labs    03/04/18 1827 03/05/18 0330  NA 139 139  K 4.0 3.9  CL 106 106  CO2 25 24  GLUCOSE 118* 119*  BUN 19 17  CREATININE 0.82 0.82  CALCIUM 8.4* 8.6*  MG 2.0 2.0   Liver Function Tests No results for input(s): AST, ALT, ALKPHOS, BILITOT, PROT, ALBUMIN in the last 72 hours. No results for input(s): LIPASE, AMYLASE in the last 72 hours. Cardiac Enzymes No results for input(s): CKTOTAL, CKMB, CKMBINDEX, TROPONINI in the last 72 hours. BNP Invalid input(s): POCBNP D-Dimer No results for input(s): DDIMER in the last 72 hours. Hemoglobin A1C No results for input(s): HGBA1C in the last 72 hours. Fasting Lipid Panel No results for input(s): CHOL, HDL, LDLCALC, TRIG, CHOLHDL,  LDLDIRECT in the last 72 hours. Thyroid Function Tests No results for input(s): TSH, T4TOTAL, T3FREE, THYROIDAB in the last 72 hours.  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 History of breast carcinoma EXAM: PORTABLE CHEST 1 VIEW COMPARISON:  February 27, 2018 FINDINGS: Central catheter tip is in the superior vena cava. No pneumothorax. There is no edema or consolidation. Heart is upper normal in size with pulmonary vascularity normal. Status post aortic valve replacement. There is aortic  atherosclerosis. No adenopathy. There are surgical clips in left axillary region. There is postoperative change in the right humeral head region. IMPRESSION: No edema or consolidation. Stable cardiac silhouette. There is aortic atherosclerosis. There is an aortic valve replacement. Central catheter tip in superior vena cava without pneumothorax. Electronically Signed   By: Lowella Grip III M.D.   On: 03/04/2018 19:06    Cardiac Studies   TAVR OPERATIVE NOTE   Date of Procedure:                03/04/2018  Preoperative Diagnosis:      Severe Aortic Stenosis   Procedure:      Transcatheter Aortic Valve Replacement - Percutaneous  Transfemoral Approach             Edwards Sapien 3 THV (size 26 mm, model # 9600TFX, serial # [id number])              Co-Surgeons:                        Valentina Gu. Roxy Manns, MD, MD and Sherren Mocha, MD  Pre-operative Echo Findings: ? Severe aortic stenosis ? Normal left ventricular systolic function  Post-operative Echo Findings: ? No paravalvular leak ? Normal left ventricular systolic function   ____________________   Post operative echo 03/05/18 (pending)    Patient Profile     Judith Solis is a 72 y.o. female with a history of HTN, HLD, breast CA s/p chemo/radiation, morbid obesity, asthma, GERD, arthritis with limited mobility and severe AS who presented to [facility] on 03/05/18 for planned TAVR.  Assessment & Plan    Severe AS: s/p successful TAVR with a 26 mm Edwards Sapien THV via the TF approach on 03/04/18. Post operative echo pending this morning. Groin sites have some ecchymosis but soft and non tender. Continue ASA and plavix. Will remove arterial and central line and transfer to the floor.   HTN: will resume home Hyzaar.  HLD: continue statin   Obesity: Body mass index is 33.89 kg/m. Diet and exercise recommended.    Signed, Angelena Form, PA-C  03/05/2018, 8:49 AM  Pager [phone]  I have seen and examined the patient and  agree with the assessment and plan as outlined.  Doing very well POD1 s/p TAVR  Rexene Alberts, MD 03/05/2018 9:22 AM

## 2018-03-05 NOTE — Discharge Instructions (Signed)
ACTIVITY AND EXERCISE °• Daily activity and exercise are an important part of your recovery. People recover at different rates depending on their general health and type of valve procedure. °• Most people recovering from TAVR feel better relatively quickly  °• No lifting, pushing, pulling more than 10 pounds (examples to avoid: groceries, vacuuming, gardening, golfing): °            - For one week with a procedure through the groin. °            - For six weeks for procedures through the chest wall. °            - For three months for procedures through the breast-bone. °NOTE: You will typically see one of our providers 7-10 days after your procedure to discuss WHEN TO RESUME the above activities.  °  °  °DRIVING °• Do not drive for until you are seen for follow up and cleared by a provider. °• If you have been told by your doctor in the past that you may not drive, you must talk with him/her before you begin driving again. °  °  °DRESSING °• Groin site: you may leave the clear dressing over the site for up to one week or until it falls off. °  °  °HYGIENE °• If you had a femoral (leg) procedure, you may take a shower when you return home. After the shower, pat the site dry. Do NOT use powder, oils or lotions in your groin area until the site has completely healed. °• If you had a chest procedure, you may shower when you return home unless specifically instructed not to by your discharging practitioner. °            - DO NOT scrub incision; pat dry with a towel °            - DO NOT apply any lotions, oils, powders to the incision °            - No tub baths / swimming for at least 2 weeks. °• If you notice any fevers, chills, increased pain, swelling, bleeding or pus, please contact your doctor. °  °ADDITIONAL INFORMATION °• If you are going to have an upcoming dental procedure, please contact our office as you will require antibiotics ahead of time to prevent infection on your heart valve.  ° ° ° ° ° °After TAVR  Checklist ° °Check  Test Description  ° Follow up appointment in 1-2 weeks  You will see our structural heart physician assistant, Katie Norell Brisbin. Your incision sites will be checked and you will be cleared to drive and resume all normal activities if you are doing well.    ° 1 month echo and follow up  You will have an echo to check on your new heart valve and be seen back in the office by Katie Lousie Calico. Many times the echo is not read by your appointment time, but Katie will call you later that day or the following day to report your results.  ° Follow up with your primary cardiologist You will need to be seen by your primary cardiologist in the following 3-6 months after your 1 month appointment in the valve clinic. Often times your Plavix or Aspirin will be discontinued during this time, but this is decided on a case by case basis.   ° 1 year echo and follow up You will have another echo to check on your heart valve   after 1 year and be seen back in the office by Katie Fabian Coca. This your last structural heart visit.  ° Bacterial endocarditis prophylaxis  You will have to take antibiotics for the rest of your life before all dental procedures (even teeth cleanings) to protect your heart valve. Antibiotics are also required before some surgeries. Please check with your cardiologist before scheduling any surgeries. Also, please make sure to tell us if you have a penicillin allergy as you will require an alternative antibiotic.   ° ° °

## 2018-03-06 ENCOUNTER — Ambulatory Visit: Payer: Medicare Other | Admitting: Sports Medicine

## 2018-03-06 ENCOUNTER — Encounter: Payer: Self-pay | Admitting: Thoracic Surgery (Cardiothoracic Vascular Surgery)

## 2018-03-06 ENCOUNTER — Other Ambulatory Visit: Payer: Self-pay | Admitting: Physician Assistant

## 2018-03-06 LAB — BASIC METABOLIC PANEL
Anion gap: 7 (ref 5–15)
BUN: 15 mg/dL (ref 8–23)
CHLORIDE: 102 mmol/L (ref 98–111)
CO2: 29 mmol/L (ref 22–32)
CREATININE: 0.91 mg/dL (ref 0.44–1.00)
Calcium: 8.8 mg/dL — ABNORMAL LOW (ref 8.9–10.3)
GFR calc Af Amer: >60 mL/min (ref >60)
GFR calc non Af Amer: >60 mL/min (ref >60)
Glucose, Bld: 119 mg/dL — ABNORMAL HIGH (ref 70–99)
Potassium: 4.5 mmol/L (ref 3.5–5.1)
Sodium: 138 mmol/L (ref 135–145)

## 2018-03-06 LAB — CBC
HCT: 35.8 % — ABNORMAL LOW (ref 36.0–46.0)
HEMOGLOBIN: 11.6 g/dL — AB (ref 12.0–15.0)
MCH: 30.1 pg (ref 26.0–34.0)
MCHC: 32.4 g/dL (ref 30.0–36.0)
MCV: 93 fL (ref 78.0–100.0)
Platelets: 171 10*3/uL (ref 150–400)
RBC: 3.85 MIL/uL — ABNORMAL LOW (ref 3.87–5.11)
RDW: 12.8 % (ref 11.5–15.5)
WBC: 6.1 10*3/uL (ref 4.0–10.5)

## 2018-03-06 MED ORDER — CLOPIDOGREL BISULFATE 75 MG PO TABS: 75.0000 mg | ORAL_TABLET | Freq: Every day | ORAL | 1 refills | Status: DC

## 2018-03-06 MED FILL — Phenylephrine HCl IV Soln 10 MG/ML: INTRAVENOUS | Qty: 2 | Status: AC

## 2018-03-06 MED FILL — Heparin Sodium (Porcine) Inj 1000 Unit/ML: INTRAMUSCULAR | Qty: 30 | Status: AC

## 2018-03-06 MED FILL — Sodium Chloride IV Soln 0.9%: INTRAVENOUS | Qty: 250 | Status: AC

## 2018-03-06 MED FILL — Magnesium Sulfate Inj 50%: INTRAMUSCULAR | Qty: 10 | Status: AC

## 2018-03-06 MED FILL — Potassium Chloride Inj 2 mEq/ML: INTRAVENOUS | Qty: 40 | Status: AC

## 2018-03-06 NOTE — Progress Notes (Signed)
Ed completed. Feeling well walking in room. Gave her exercise guidelines to walk as tolerated this next week and then do bicycle when her groins are evaluated in office. She is eager to lose weight and do CRPII [place].  [place], ACSM 12:08 PM 03/06/2018

## 2018-03-06 NOTE — Discharge Summary (Addendum)
[facility] TEAM   Discharge Summary    Patient ID: Judith Solis,  MRN: [id number], DOB/AGE: Dec 11, 1945 72 y.o.  Admit date: 03/04/2018 Discharge date: 03/06/2018  Primary Care Provider: Ronita Hipps Primary Cardiologist:  Dr. Bettina Gavia / Dr. Burt Knack & Dr. Roxy Manns (TAVR)   Discharge Diagnoses    Principal Problem:   S/P TAVR (transcatheter aortic valve replacement) Active Problems:   Hyperlipidemia   Essential hypertension   Severe aortic stenosis   Allergies Allergies  Allergen Reactions  . Codeine Nausea And Vomiting  . Oxycodone-Acetaminophen Other (See Comments)    Hallucinations     History of Present Illness     Judith Solis is a 72 y.o. female with a history of HTN, HLD, breast CA s/p chemo/radiation, morbid obesity, asthma, GERD, arthritis with limited mobility and severe AS who presented to [facility] on 03/05/18 for planned TAVR.  The patient states that she has known of the presence of a heart murmur for many years.  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  This has dramatically limited her mobility over the last several years, and the patient previously was scheduled for elective total ankle replacement last year.  She underwent a transthoracic echocardiogram demonstrating the presence of moderate aortic stenosis with preserved left ventricular systolic function.  She was seen in consultation by Dr. Bettina Gavia and elective ankle surgery was postponed indefinitely.  Over the past 6 months the patient has developed worsening symptoms of exertional shortness of breath.  Over the past 3 months symptoms have become limiting, and the patient now complains that she gets short of breath with moderate level activity.  She was seen in follow-up by Dr. Bettina Gavia and repeat echocardiogram revealed significant progression of disease.   Peak velocity across the aortic valve measured 4.3 m/s corresponding to mean transvalvular gradient estimated 41 mmHg.  The DVI was measured 0.24 and aortic valve area calculated 0.92 cm.  Left ventricular systolic function was mildly reduced with ejection fraction estimated 50 to 55%. The patient subsequently underwent diagnostic cardiac catheterization confirming the presence of severe aortic stenosis with mean transvalvular gradient measured 53 mmHg corresponding to aortic valve area estimated 0.6 cm.  Patient was found to have normal coronary arteries with no significant coronary artery disease.  She was evaluated by the multidisciplinary valve team and felt to be a good candidate for TAVR, which was set up for 03/04/18.  Hospital Course     Consultants: none  Severe AS: s/p successful TAVR with a 26 mm Edwards Sapien THV via the TF approach on 03/04/18. Post operative echo showed EF 55% with normally functioning TAVR valve with no PVL and mean gradient 10 mmHg. Groin sites have some ecchymosis but soft and non tender. Continue ASA and plavix. DC home today with 1 week TOC with me in valve clinic.    HTN: BP well controlled back on home medications  HLD: continue statin   Obesity: Body mass index is 33.89 kg/m. Diet and exercise recommended.   The patient has had an uncomplicated hospital course and is recovering well. The femoral catheter sites are stable. She has been seen by Dr. Roxy Manns today and deemed ready for discharge home. All follow-up appointments have been scheduled. Discharge medications are listed below.  _____________  Discharge Vitals Blood pressure 130/82, pulse 77, temperature 98.1 F (36.7 C), temperature source Oral,  resp. rate 16, height 5\' 6"  (1.676 m), weight 96.7 kg, SpO2 93 %.  Filed Weights   03/04/18 1040 03/05/18 1408 03/06/18 0443  Weight: 95.3 kg 97.3 kg 96.7 kg   GEN: Well nourished, well developed, in no acute distress. obese HEENT: Grossly normal.    Neck: Supple, no JVD, carotid bruits, or masses. Cardiac: RRR. Soft flow murmur @ RUSB. No rubs, or gallops. No clubbing, cyanosis, edema.  Radials/DP/PT 2+ and equal bilaterally.  Respiratory:  Respirations regular and unlabored, clear to auscultation bilaterally. GI: Soft, nontender, nondistended, BS + x 4. MS: no deformity or atrophy. Skin: warm and dry, no rash. Mild ecchymosis on right groin site Neuro:  Strength and sensation are intact. Psych: AAOx3.  Normal affect.  Labs & Radiologic Studies     CBC Recent Labs    03/05/18 0330 03/06/18 0331  WBC 6.2 6.1  HGB 12.1 11.6*  HCT 37.3 35.8*  MCV 93.0 93.0  PLT 197 379   Basic Metabolic Panel Recent Labs    03/04/18 1827  03/05/18 0330 03/06/18 0331  NA 139   < > 139 138  K 4.0   < > 3.9 4.5  CL 106   < > 106 102  CO2 25  --  24 29  GLUCOSE 118*   < > 119* 119*  BUN 19   < > 17 15  CREATININE 0.82   < > 0.82 0.91  CALCIUM 8.4*  --  8.6* 8.8*  MG 2.0  --  2.0  --    < > = values in this interval not displayed.   Liver Function Tests No results for input(s): AST, ALT, ALKPHOS, BILITOT, PROT, ALBUMIN in the last 72 hours. No results for input(s): LIPASE, AMYLASE in the last 72 hours. Cardiac Enzymes No results for input(s): CKTOTAL, CKMB, CKMBINDEX, TROPONINI in the last 72 hours. BNP Invalid input(s): POCBNP D-Dimer No results for input(s): DDIMER in the last 72 hours. Hemoglobin A1C No results for input(s): HGBA1C in the last 72 hours. Fasting Lipid Panel No results for input(s): CHOL, HDL, LDLCALC, TRIG, CHOLHDL, LDLDIRECT in the last 72 hours. Thyroid Function Tests No results for input(s): TSH, T4TOTAL, T3FREE, THYROIDAB in the last 72 hours.  Invalid input(s): FREET3  Dg Chest 2 View  Result Date: 02/27/2018 CLINICAL DATA:  Preoperative evaluation for upcoming aortic valve surgery EXAM: CHEST - 2 VIEW COMPARISON:  01/27/2018 FINDINGS: Cardiac shadow is within normal limits. Aortic calcifications are  seen. The lungs are clear. No focal infiltrate or sizable effusion is seen. Degenerative changes of the thoracic spine are noted. IMPRESSION: No active cardiopulmonary disease. Electronically Signed   By: Inez [name] M.D.   On: 02/27/2018 16:27   Ct Coronary Morph W/cta Cor W/score W/ca W/cm &/or Wo/cm  Addendum Date: 02/14/2018   ADDENDUM REPORT: 02/14/2018 08:54 EXAM: OVER-READ INTERPRETATION  CT CHEST The following report is an over-read performed by radiologist Dr. Samara Snide [facility] on 02/14/2018. This over-read does not include interpretation of cardiac or coronary anatomy or pathology. The coronary CTA interpretation by the cardiologist is attached. COMPARISON:  05/30/2015 chest CT angiogram. 01/27/2018 chest radiograph. FINDINGS: Please see the separate concurrent dedicated chest CT angiogram report for details. No acute consolidative airspace disease, lung masses or significant pulmonary nodules. No thoracic adenopathy. IMPRESSION: Please see the separate concurrent dedicated chest CT angiogram report for details. No significant extracardiac findings. Electronically Signed   By: Ilona Sorrel M.D.   On: 02/14/2018 08:54  Result Date: 02/14/2018 CLINICAL DATA:  Aortic Stenosis EXAM: Cardiac TAVR CT TECHNIQUE: The patient was scanned on a Siemens Force 268 slice scanner. A 120 kV retrospective scan was triggered in the ascending thoracic aorta at 140 HU's. Gantry rotation speed was 250 msecs and collimation was .6 mm. No beta blockade or nitro were given. The 3D data set was reconstructed in 5% intervals of the R-R cycle. Systolic and diastolic phases were analyzed on a dedicated work station using MPR, MIP and VRT modes. The patient received 80 cc of contrast. FINDINGS: Aortic Valve: Tri-leaflet with spherical annulus Somewhat less calcified than typically seen for TAVR. Aorta: No aneurysm mild calcific aortic atherosclerosis Sino-tubular Junction: 28 mm Ascending Thoracic Aorta: 36  mm Aortic Arch: 27 mm Descending Thoracic Aorta: 24 mm Sinus of Valsalva Measurements: Non-coronary: 34.3 mm Right - coronary: 32 mm Left -   coronary: 31.2 mm Coronary Artery Height above Annulus: Left Main: 14.1 mm above annulus Right Coronary: 18.8 mm above annulus Virtual Basal Annulus Measurements: Maximum / Minimum Diameter: 25.3 mm x 23.3 mm Perimeter: 76 mm Area: 462 mm2 Coronary Arteries: Sufficient height above annulus for deployment Optimum Fluoroscopic Angle for Delivery: LAO 5 degrees Cranial 16 degrees IMPRESSION: 1. Tri leaflet AV with annulus 462 mm2 suitable for a 26 mm Sapien 3 valve 2. Optimum angiographic angle for deployment LAO 5 degrees Cranial 16 degrees 3.  Coronary arteries sufficient height above annulus for deployment 4.  Normal aortic root 3.6 cm 5.  No LAA thrombus Jenkins Rouge Electronically Signed: By: Jenkins Rouge M.D. On: 02/12/2018 16:03   Dg Chest Port 1 View  Result Date: 03/04/2018 CLINICAL DATA:  Status post aortic valve replacement for aortic stenosis. History of breast carcinoma EXAM: PORTABLE CHEST 1 VIEW COMPARISON:  February 27, 2018 FINDINGS: Central catheter tip is in the superior vena cava. No pneumothorax. There is no edema or consolidation. Heart is upper normal in size with pulmonary vascularity normal. Status post aortic valve replacement. There is aortic atherosclerosis. No adenopathy. There are surgical clips in left axillary region. There is postoperative change in the right humeral head region. IMPRESSION: No edema or consolidation. Stable cardiac silhouette. There is aortic atherosclerosis. There is an aortic valve replacement. Central catheter tip in superior vena cava without pneumothorax. Electronically Signed   By: Lowella Grip III M.D.   On: 03/04/2018 19:06   Ct Angio Chest Aorta W &/or Wo Contrast  Result Date: 02/14/2018 CLINICAL DATA:  Severe aortic stenosis, symptomatic. Pre-TAVR evaluation. EXAM: CT ANGIOGRAPHY CHEST, ABDOMEN AND PELVIS  TECHNIQUE: Multidetector CT imaging through the chest, abdomen and pelvis was performed using the standard protocol during bolus administration of intravenous contrast. Multiplanar reconstructed images and MIPs were obtained and reviewed to evaluate the vascular anatomy. CONTRAST:  180mL ISOVUE-370 IOPAMIDOL (ISOVUE-370) INJECTION 76% COMPARISON:  01/27/2018 chest radiograph. 05/30/2015 chest CT angiogram. FINDINGS: CTA CHEST FINDINGS Cardiovascular: Borderline mild cardiomegaly. No significant pericardial effusion/thickening. Severe thickening and calcification of the aortic valve. Left anterior descending coronary atherosclerosis. Atherosclerotic nonaneurysmal thoracic aorta. Normal caliber pulmonary arteries. No central pulmonary emboli. Mediastinum/Nodes: Subcentimeter hypodense anterior right thyroid lobe nodule. Unremarkable esophagus. No pathologically enlarged axillary, mediastinal or hilar lymph nodes. Lungs/Pleura: No pneumothorax. No pleural effusion. No acute consolidative airspace disease, lung masses or significant pulmonary nodules. Musculoskeletal: No aggressive appearing focal osseous lesions. Moderate lower thoracic spondylosis. Review of the MIP images confirms the above findings. CTA ABDOMEN AND PELVIS FINDINGS Hepatobiliary: Normal liver with no liver mass. Normal gallbladder with no  radiopaque cholelithiasis. No biliary ductal dilatation. Pancreas: Normal, with no mass or duct dilation. Spleen: Normal size. No mass. Adrenals/Urinary Tract: Right adrenal 1.7 cm nodule (series 14/image 97) is stable in size since 05/30/2015 chest CT, compatible with a benign adenoma. No left adrenal nodule. Simple 1.3 cm interpolar left renal cyst. Otherwise normal kidneys, with no hydronephrosis. Nondistended bladder obscured by streak artifact from bilateral hip hardware. Stomach/Bowel: Normal non-distended stomach. Normal caliber small bowel with no small bowel wall thickening. Normal appendix. Mild sigmoid  diverticulosis, with no large bowel wall thickening or significant pericolonic fat stranding. Vascular/Lymphatic: Atherosclerotic nonaneurysmal abdominal aorta. Patent renal, splenic and portal veins. 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 No pathologically enlarged lymph nodes in the abdomen or pelvis. Reproductive: Status post hysterectomy. Hysterectomy margin is obscured by streak artifact. No adnexal mass. Other: No pneumoperitoneum, ascites or focal fluid collection. Musculoskeletal: No aggressive appearing focal osseous lesions. Bilateral total hip arthroplasty. Moderate lower lumbar spondylosis. VASCULAR MEASUREMENTS PERTINENT TO TAVR: AORTA: Minimal Aortic Diameter-15.5 x 14.6 mm Severity of Aortic Calcification-mild-to-moderate RIGHT PELVIS: Right Common Iliac Artery - Minimal Diameter-10.3 x 9.5 mm Tortuosity-mild Calcification-mild Right External Iliac Artery - Minimal Diameter-7.9 x 7.5 mm Tortuosity-mild-to-moderate Calcification-none Right Common Femoral Artery - Minimal Diameter-8.3 x 5.8 mm Tortuosity-mild Calcification-moderate LEFT PELVIS: Left Common Iliac Artery - Minimal Diameter-10.3 x 9.9 mm Tortuosity-mild Calcification-mild Left External Iliac Artery - Minimal Diameter-7.5 x 7.2 mm Tortuosity-moderate Calcification-none Left Common Femoral Artery - Minimal Diameter-7.8 x 6.9 mm Tortuosity-mild Calcification-mild Review of the MIP images confirms the above findings. IMPRESSION: 1. Vascular findings and measurements pertinent to potential TAVR procedure, as detailed above. 2. Severe thickening and calcification of the aortic valve, compatible with the reported clinical history of severe aortic stenosis. 3.  Aortic Atherosclerosis (ICD10-I70.0). 4. 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 5. Borderline mild cardiomegaly. 6. One vessel coronary atherosclerosis. 7. Right adrenal  adenoma, stable. 8. Mild sigmoid diverticulosis. Electronically Signed   By: Ilona Sorrel M.D.   On: 02/14/2018 08:52   Ct Angio Abdomen Pelvis  W &/or Wo Contrast  Result Date: 02/14/2018 CLINICAL DATA:  Severe aortic stenosis, symptomatic. Pre-TAVR evaluation. EXAM: CT ANGIOGRAPHY CHEST, ABDOMEN AND PELVIS TECHNIQUE: Multidetector CT imaging through the chest, abdomen and pelvis was performed using the standard protocol during bolus administration of intravenous contrast. Multiplanar reconstructed images and MIPs were obtained and reviewed to evaluate the vascular anatomy. CONTRAST:  168mL ISOVUE-370 IOPAMIDOL (ISOVUE-370) INJECTION 76% COMPARISON:  01/27/2018 chest radiograph. 05/30/2015 chest CT angiogram. FINDINGS: CTA CHEST FINDINGS Cardiovascular: Borderline mild cardiomegaly. No significant pericardial effusion/thickening. Severe thickening and calcification of the aortic valve. Left anterior descending coronary atherosclerosis. Atherosclerotic nonaneurysmal thoracic aorta. Normal caliber pulmonary arteries. No central pulmonary emboli. Mediastinum/Nodes: Subcentimeter hypodense anterior right thyroid lobe nodule. Unremarkable esophagus. No pathologically enlarged axillary, mediastinal or hilar lymph nodes. Lungs/Pleura: No pneumothorax. No pleural effusion. No acute consolidative airspace disease, lung masses or significant pulmonary nodules. Musculoskeletal: No aggressive appearing focal osseous lesions. Moderate lower thoracic spondylosis. Review of the MIP images confirms the above findings. CTA ABDOMEN AND PELVIS FINDINGS Hepatobiliary: Normal liver with no liver mass. Normal gallbladder with no radiopaque cholelithiasis. No biliary ductal dilatation. Pancreas: Normal, with no mass or duct dilation. Spleen: Normal size. No mass. Adrenals/Urinary Tract: Right adrenal 1.7 cm nodule (series 14/image 97) is stable in size since 05/30/2015 chest CT, compatible with a benign adenoma. No left  adrenal  nodule. Simple 1.3 cm interpolar left renal cyst. Otherwise normal kidneys, with no hydronephrosis. Nondistended bladder obscured by streak artifact from bilateral hip hardware. Stomach/Bowel: Normal non-distended stomach. Normal caliber small bowel with no small bowel wall thickening. Normal appendix. Mild sigmoid diverticulosis, with no large bowel wall thickening or significant pericolonic fat stranding. Vascular/Lymphatic: Atherosclerotic nonaneurysmal abdominal aorta. Patent renal, splenic and portal veins. 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 No pathologically enlarged lymph nodes in the abdomen or pelvis. Reproductive: Status post hysterectomy. Hysterectomy margin is obscured by streak artifact. No adnexal mass. Other: No pneumoperitoneum, ascites or focal fluid collection. Musculoskeletal: No aggressive appearing focal osseous lesions. Bilateral total hip arthroplasty. Moderate lower lumbar spondylosis. VASCULAR MEASUREMENTS PERTINENT TO TAVR: AORTA: Minimal Aortic Diameter-15.5 x 14.6 mm Severity of Aortic Calcification-mild-to-moderate RIGHT PELVIS: Right Common Iliac Artery - Minimal Diameter-10.3 x 9.5 mm Tortuosity-mild Calcification-mild Right External Iliac Artery - Minimal Diameter-7.9 x 7.5 mm Tortuosity-mild-to-moderate Calcification-none Right Common Femoral Artery - Minimal Diameter-8.3 x 5.8 mm Tortuosity-mild Calcification-moderate LEFT PELVIS: Left Common Iliac Artery - Minimal Diameter-10.3 x 9.9 mm Tortuosity-mild Calcification-mild Left External Iliac Artery - Minimal Diameter-7.5 x 7.2 mm Tortuosity-moderate Calcification-none Left Common Femoral Artery - Minimal Diameter-7.8 x 6.9 mm Tortuosity-mild Calcification-mild Review of the MIP images confirms the above findings. IMPRESSION: 1. Vascular findings and measurements pertinent to potential TAVR procedure, as detailed above. 2. Severe thickening and calcification of the aortic  valve, compatible with the reported clinical history of severe aortic stenosis. 3.  Aortic Atherosclerosis (ICD10-I70.0). 4. 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 5. Borderline mild cardiomegaly. 6. One vessel coronary atherosclerosis. 7. Right adrenal adenoma, stable. 8. Mild sigmoid diverticulosis. Electronically Signed   By: Ilona Sorrel M.D.   On: 02/14/2018 08:52     Diagnostic Studies/Procedures    TAVR OPERATIVE NOTE   Date of Procedure:03/04/2018  Preoperative Diagnosis:Severe Aortic Stenosis   Procedure:  Transcatheter Aortic Valve Replacement - Percutaneous Transfemoral Approach Edwards Sapien 3 THV (size 38mm, model # 9600TFX, serial # [id number])  Co-Surgeons:Clarence H. Roxy Manns, MD, MD and Sherren Mocha, MD  Pre-operative Echo Findings: ? Severe aortic stenosis ? Normalleft ventricular systolic function  Post-operative Echo Findings: ? Noparavalvular leak ? Normalleft ventricular systolic function   ____________________   Post operative echo 03/05/18  Study Conclusions - Left ventricle: The cavity size was normal. Systolic function was   normal. The estimated ejection fraction was in the range of 55%   to 60%. Wall motion was normal; there were no regional wall   motion abnormalities. Features are consistent with a pseudonormal   left ventricular filling pattern, with concomitant abnormal   relaxation and increased filling pressure (grade 2 diastolic   dysfunction). Doppler parameters are consistent with high   ventricular filling pressure. - Aortic valve: S/P TAVR well functioning with no perivalvular AI.   Mean gradient (S): 10 mm Hg. Valve area (VTI): 1.65 cm^2. Valve   area (Vmax): 1.59 cm^2. Valve area (Vmean): 1.58 cm^2. - Mitral valve: Calcified annulus. There was trivial regurgitation. - Pulmonary arteries: Systolic  pressure could not be accurately   estimated. Impressions: - Compared to prior echo, TAVR is now present and LVF has improved.   Disposition   Pt is being discharged home today in good condition.  Follow-up Plans & Appointments    Follow-up Information    Eileen Stanford, PA-C. Go on 03/12/2018.  Specialties:  Cardiology, Radiology Why:  @ 3:30pm Contact information: [address] [id number] [phone]            Discharge Medications     Medication List    TAKE these medications   aspirin EC 81 MG tablet Take 81 mg by mouth at bedtime.   atorvastatin 10 MG tablet Commonly known as:  LIPITOR Take 10 mg by mouth at bedtime.   CALTRATE 600 1500 (600 Ca) MG Tabs tablet Generic drug:  calcium carbonate Caltrate   citalopram 20 MG tablet Commonly known as:  CELEXA Take 20 mg by mouth daily.   clobetasol cream 0.05 % Commonly known as:  TEMOVATE Apply 1 application topically 2 (two) times daily as needed (vaginal irritation).   clopidogrel 75 MG tablet Commonly known as:  PLAVIX Take 1 tablet (75 mg total) by mouth daily with breakfast. Start taking on:  03/07/2018   losartan-hydrochlorothiazide 50-12.5 MG tablet Commonly known as:  HYZAAR TAKE 1 TABLET EVERY DAY   magnesium gluconate 500 MG tablet Commonly known as:  MAGONATE Take 500 mg by mouth at bedtime.   MULTI-VITAMINS Tabs Take 1 tablet by mouth daily.   multivitamin with minerals Tabs tablet Take 1 tablet by mouth daily.   pantoprazole 40 MG tablet Commonly known as:  PROTONIX Take 40 mg by mouth daily.   pramipexole 0.125 MG tablet Commonly known as:  MIRAPEX Take 0.125 mg by mouth every night at bedtime   traZODone 50 MG tablet Commonly known as:  DESYREL Take 50 mg by mouth at bedtime.   valACYclovir 500 MG tablet Commonly known as:  VALTREX valacyclovir 500 mg tablet   VENTOLIN HFA 108 (90 Base) MCG/ACT inhaler Generic drug:   albuterol Inhale 1 puff into the lungs every 6 (six) hours as needed for wheezing or shortness of breath.   Vitamin D3 5000 units Tabs Take 5,000 Units by mouth at bedtime.         Outstanding Labs/Studies   none  Duration of Discharge Encounter   Greater than 30 minutes including physician time.  Signed, Angelena Form PA-C 03/06/2018, 9:14 AM

## 2018-03-06 NOTE — Progress Notes (Addendum)
Patient in a stable condition, discharge education reviewed with patient at bedside,she verbalized understanding, iv removed, tele dc ccmd notified,iv removed, patient belongings at bedside, patient's daughter to transport patient home.

## 2018-03-06 NOTE — Consult Note (Signed)
[facility] CM Primary Care Navigator  03/06/2018  Judith Solis 1946-04-27 [id number]              [facility] CM Primary Care Navigator  03/06/2018  Judith Solis [birth year] [id number]   Met with patient at the bedside to identify possible discharge needs. Patient reports "feeling exhausted all the time" and short of breath with exertion that had led to this admission/ surgery. (severe aortic stenosis status post transcatheter aortic valve replacement)  Patient endorses Dr. Kennith Maes with [facility] as the primary care provider.    Patient shared using [facility] [place] [place] [facility] service (for Rx) to obtain medications without any problem.   Patient states managing her medications at home using "pill box" system filled every 2 weeks.   Patient has been driving prior to admission/ surgery but daughter Judith Solis) can provide transportation to her doctors' appointments after discharge.  Patient lives by herself and independent with her own care but her daughter will provide assistance when needed at home.   Anticipated discharge plan is home per patient. She mentioned that daughter will be staying with her for few days after discharge to assist with her needs.  Patient voiced understanding to call primary care provider's office for a post discharge follow-up appointment within 1- 2 weeks or sooner if needs arise. Patient letter (with PCP's contact number) was provided as a reminder.  Discussed with patientregarding [facility] CM services available for health managementandresourcesat homebutshe denies any needs or concerns at this time. She states being capable of managing her health issues so far. Patient verbalizedunderstandingof needto seekreferral from primary care provider to [facility] care management ifdeemed necessary and appropriatefor anyservicesin thefuture.  [facility] care management information was provided for  futureneeds that she may have.  Patienthowever,verbally agreedand optedforEMMIcalls tofollow-up with her recoveryat home.   Referral made for [facility] calls after discharge.   For additional questions please contact:  Edwena Felty A. Modestine Scherzinger, BSN, RN-BC [facility] PRIMARY CARE Navigator Cell: [phone]

## 2018-03-07 ENCOUNTER — Telehealth: Payer: Self-pay

## 2018-03-07 NOTE — Telephone Encounter (Signed)
Patient contacted regarding discharge from [facility] on 03/06/2018.  Patient understands to follow up with provider Angelena Form PA-C on 03/12/2018 at 3:30 PM at [facility] office. Patient understands discharge instructions? yes Patient understands medications and regiment? yes Patient understands to bring all medications to this visit? yes  The pt is doing well since discharge. No complaints or questions at this time.

## 2018-03-12 ENCOUNTER — Ambulatory Visit: Payer: Medicare Other | Admitting: Cardiology

## 2018-03-12 ENCOUNTER — Ambulatory Visit (INDEPENDENT_AMBULATORY_CARE_PROVIDER_SITE_OTHER): Payer: Medicare Other | Admitting: Physician Assistant

## 2018-03-12 ENCOUNTER — Encounter: Payer: Self-pay | Admitting: Physician Assistant

## 2018-03-12 VITALS — BP 130/84 | HR 84 | Ht 66.0 in | Wt 207.8 lb

## 2018-03-12 DIAGNOSIS — E669 Obesity, unspecified: Secondary | ICD-10-CM | POA: Diagnosis not present

## 2018-03-12 DIAGNOSIS — I10 Essential (primary) hypertension: Secondary | ICD-10-CM | POA: Diagnosis not present

## 2018-03-12 DIAGNOSIS — Z952 Presence of prosthetic heart valve: Secondary | ICD-10-CM | POA: Diagnosis not present

## 2018-03-12 DIAGNOSIS — E785 Hyperlipidemia, unspecified: Secondary | ICD-10-CM

## 2018-03-12 NOTE — Progress Notes (Signed)
[facility] [place]                                       Cardiology Office Note    Date:  03/12/2018   ID:  Judith Solis, DOB 1945-07-25, MRN [id number]  PCP:  Ronita Hipps, MD  Cardiologist: Dr. Bettina Gavia / Dr. Burt Knack & Dr. Roxy Manns (TAVR)  CC: TOC s/p TAVR  History of Present Illness:  Judith Solis is a 72 y.o. 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  The patient states that she has known of the presence of a heart murmur for many years. 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 This has dramatically limited her mobility over the last several years, and the patient previously was scheduled for elective total ankle replacement last year. She underwent a transthoracic echocardiogram demonstrating the presence of moderate aortic stenosis with preserved left ventricular systolic function. She was seen in consultation by Dr. Bettina Gavia and elective ankle surgery was postponed indefinitely. Over the past 6 months the patient has developed worsening symptoms of exertional shortness of breath. Over the past 3 months symptoms have become limiting, and the patient now complains that she gets short of breath with moderate level activity.She was seen in follow-up by Dr. Bettina Gavia and repeat echocardiogram revealed significant progression of disease. Peak velocity across the aortic valve measured 4.3 m/s corresponding to mean transvalvular gradient estimated 41 mmHg. The DVI was measured 0.24 and aortic valve area calculated 0.92 cm.Left ventricular systolic function was mildly reduced with ejection fraction estimated 50 to 55%. The patient subsequently underwent diagnostic cardiac  catheterization confirming the presence of severe aortic stenosis with mean transvalvular gradient measured 53 mmHg corresponding to aortic valve area estimated 0.6 cm. Patient was found to have normal coronary arteries with no significant coronary artery disease.  She underwent successful TAVR with a 26 mm Edwards Sapien THV via the TF approach on 03/04/18. Post operative echo showed EF 55% with normally functioning TAVR valve with no PVL and mean gradient 10 mmHg. She was discharged on ASA and plavix.   Today she presents to clinic for follow up. She is doing great. She has been walking with no dyspnea. She did go to [employer] and walked all over the store and felt tired afterwards. No CP. No LE edema, orthopnea or PND. No dizziness or syncope. No blood in stool or urine. No palpitations.     Past Medical History:  Diagnosis Date  . Aortic valve regurgitation   . Arthritis   . Asthma   . Breast cancer ([place])    a. s/p chemo and radiation  . GERD (gastroesophageal reflux disease)   . Hyperlipidemia   . Hypertension   . Morbid obesity ([place])   . 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 Severe aortic stenosis     Past Surgical History:  Procedure Laterality Date  . ABDOMINAL HYSTERECTOMY    . BREAST SURGERY     LUMPECTOMY WITH CHEMO & RADIATION  . CATARACT EXTRACTION    .  EYE SURGERY    . INTRAOPERATIVE TRANSTHORACIC ECHOCARDIOGRAM  03/04/2018   Procedure: INTRAOPERATIVE TRANSTHORACIC ECHOCARDIOGRAM;  Surgeon: Sherren Mocha, MD;  Location: [facility] OR;  Service: Open Heart Surgery;;  . JOINT REPLACEMENT     BILATERAL HIPS 2007 & 2008  . RIGHT/LEFT HEART CATH AND CORONARY ANGIOGRAPHY N/A 01/31/2018   Procedure: RIGHT/LEFT HEART CATH AND CORONARY ANGIOGRAPHY;  Surgeon: Burnell Blanks, MD;  Location: [place] CV LAB;  Service: Cardiovascular;  Laterality:  N/A;  . ROTATOR CUFF REPAIR    . TONSILLECTOMY    . TRANSCATHETER AORTIC VALVE REPLACEMENT, TRANSFEMORAL N/A 03/04/2018   Procedure: TRANSCATHETER AORTIC VALVE REPLACEMENT, TRANSFEMORAL. EDWARDS 26MM SAPIEN 3 TRANSCATHETER HEART VALVE.;  Surgeon: Sherren Mocha, MD;  Location: [place];  Service: Open Heart Surgery;  Laterality: N/A;    Current Medications: Outpatient Medications Prior to Visit  Medication Sig Dispense Refill  . aspirin EC 81 MG tablet Take 81 mg by mouth at bedtime.     Marland Kitchen atorvastatin (LIPITOR) 10 MG tablet Take 10 mg by mouth at bedtime.     . Cholecalciferol (VITAMIN D3) 5000 units TABS Take 5,000 Units by mouth at bedtime.    . citalopram (CELEXA) 20 MG tablet Take 20 mg by mouth daily.     . clobetasol cream (TEMOVATE) 4.13 % Apply 1 application topically 2 (two) times daily as needed (vaginal irritation).    . clopidogrel (PLAVIX) 75 MG tablet Take 1 tablet (75 mg total) by mouth daily with breakfast. 90 tablet 1  . losartan-hydrochlorothiazide (HYZAAR) 50-12.5 MG tablet TAKE 1 TABLET EVERY DAY 90 tablet 1  . magnesium gluconate (MAGONATE) 500 MG tablet Take 500 mg by mouth at bedtime.    . Multiple Vitamin (MULTI-VITAMINS) TABS Take 1 tablet by mouth daily.     . pantoprazole (PROTONIX) 40 MG tablet Take 40 mg by mouth daily.    . pramipexole (MIRAPEX) 0.125 MG tablet Take 0.125 mg by mouth every night at bedtime    . traZODone (DESYREL) 50 MG tablet Take 50 mg by mouth at bedtime.     . valACYclovir (VALTREX) 500 MG tablet valacyclovir 500 mg tablet    . VENTOLIN HFA 108 (90 Base) MCG/ACT inhaler Inhale 1 puff into the lungs every 6 (six) hours as needed for wheezing or shortness of breath.     . calcium carbonate (CALTRATE 600) 1500 (600 Ca) MG TABS tablet Caltrate    . Multiple Vitamin (MULTIVITAMIN WITH MINERALS) TABS tablet Take 1 tablet by mouth daily.     No facility-administered medications prior to visit.      Allergies:   Codeine and  Oxycodone-acetaminophen   Social History   Socioeconomic History  . Marital status: Widowed    Spouse name: Not on file  . Number of children: Not on file  . Years of education: Not on file  . Highest education level: Not on file  Occupational History  . Not on file  Social Needs  . Financial resource strain: Not on file  . Food insecurity:    Worry: Not on file    Inability: Not on file  . Transportation needs:    Medical: Not on file    Non-medical: Not on file  Tobacco Use  . Smoking status: Former Smoker    Packs/day: 1.50    Years: 20.00    Pack years: 30.00    Types: Cigarettes    Last attempt to quit: 06/04/1990    Years since quitting: 27.7  . Smokeless tobacco:  Never Used  Substance and Sexual Activity  . Alcohol use: No  . Drug use: No  . Sexual activity: Not on file  Lifestyle  . Physical activity:    Days per week: Not on file    Minutes per session: Not on file  . Stress: Not on file  Relationships  . Social connections:    Talks on phone: Not on file    Gets together: Not on file    Attends religious service: Not on file    Active member of club or organization: Not on file    Attends meetings of clubs or organizations: Not on file    Relationship status: Not on file  Other Topics Concern  . Not on file  Social History Narrative  . Not on file     Family History:  The patient's family history includes Diabetes Mellitus II in her paternal grandfather, sister, and sister; Heart Problems in her brother and sister; Heart disease in her brother, maternal grandfather, and mother; Hepatitis C in her sister; Lung cancer in her sister; Stroke in her father; Throat cancer in her sister.      ROS:   Please see the history of present illness.    ROS All other systems reviewed and are negative.   PHYSICAL EXAM:   VS:  BP 130/84   Pulse 84   Ht 5\' 6"  (1.676 m)   Wt 207 lb 12.8 oz (94.3 kg)   SpO2 98%   BMI 33.54 kg/m    GEN: Well nourished, well  developed, in no acute distress. obese HEENT: normal  Neck: no JVD, carotid bruits, or masses Cardiac: RRR. Soft flow murmur. no rubs, or gallops,no edema  Respiratory:  clear to auscultation bilaterally, normal work of breathing GI: soft, nontender, nondistended, + BS MS: no deformity or atrophy  Skin: warm and dry, no rash. Right groin with small lump no bruit or tenderness Neuro:  Alert and Oriented x 3, Strength and sensation are intact Psych: euthymic mood, full affect    Wt Readings from Last 3 Encounters:  03/12/18 207 lb 12.8 oz (94.3 kg)  03/06/18 213 lb 1.6 oz (96.7 kg)  02/27/18 210 lb 14.4 oz (95.7 kg)      Studies/Labs Reviewed:   EKG:  EKG is ordered today.  The ekg ordered today demonstrates sinus HR 84  Recent Labs: 02/27/2018: ALT 40; B Natriuretic Peptide 63.0 03/05/2018: Magnesium 2.0 03/06/2018: BUN 15; Creatinine, Ser 0.91; Hemoglobin 11.6; Platelets 171; Potassium 4.5; Sodium 138   Lipid Panel No results found for: CHOL, TRIG, HDL, CHOLHDL, VLDL, LDLCALC, LDLDIRECT  Additional studies/ records that were reviewed today include:  TAVR OPERATIVE NOTE   Date of Procedure:03/04/2018  Preoperative Diagnosis:Severe Aortic Stenosis   Procedure:  Transcatheter Aortic Valve Replacement - Percutaneous Transfemoral Approach Edwards Sapien 3 THV (size 79mm, model # 9600TFX, serial # [id number])  Co-Surgeons:Clarence H. Roxy Manns, MD, MD and Sherren Mocha, MD  Pre-operative Echo Findings: ? Severe aortic stenosis ? Normalleft ventricular systolic function  Post-operative Echo Findings: ? Noparavalvular leak ? Normalleft ventricular systolic function   ____________________   Post operative echo 03/05/18  Study Conclusions - Left ventricle: The cavity size was normal. Systolic function was normal. The estimated ejection fraction was in the range of 55% to 60%. Wall motion was  normal; there were no regional wall motion abnormalities. Features are consistent with a pseudonormal left ventricular filling pattern, with concomitant abnormal relaxation and increased filling pressure (grade 2 diastolic dysfunction). Doppler parameters  are consistent with high ventricular filling pressure. - Aortic valve: S/P TAVR well functioning with no perivalvular AI. Mean gradient (S): 10 mm Hg. Valve area (VTI): 1.65 cm^2. Valve area (Vmax): 1.59 cm^2. Valve area (Vmean): 1.58 cm^2. - Mitral valve: Calcified annulus. There was trivial regurgitation. - Pulmonary arteries: Systolic pressure could not be accurately estimated. Impressions: - Compared to prior echo, TAVR is now present and LVF has improved.   ASSESSMENT & PLAN:   Severe AS s/p TAVR: doing excellent after TAVR. Groin sites are healing well. ECG with NSR and no block. She has amoxicillin for SBE prophylaxis from her dentist. Continue ASA and plavix. I will see her back in 1 month for follow up and echo. She plans to do cardiac rehab at [facility].    HTN: BP well controlled today  HLD: continue statin   Obesity:counseled on the importance of diet and exercise. She has already started a new diet because she was told she was pre diabetic at the hospital (HgA1c 6.3). We discussed the Mediterranean diet in detail  Medication Adjustments/Labs and Tests Ordered: Current medicines are reviewed at length with the patient today.  Concerns regarding medicines are outlined above.  Medication changes, Labs and Tests ordered today are listed in the Patient Instructions below. Patient Instructions  Medication Instructions:  Your physician recommends that you continue on your current medications as directed. Please refer to the Current Medication list given to you today.   Labwork: none  Testing/Procedures: Your physician has requested that you have an echocardiogram. Echocardiography is a painless  test that uses sound waves to create images of your heart. It provides your doctor with information about the size and shape of your heart and how well your heart's chambers and valves are working. This procedure takes approximately one hour. There are no restrictions for this procedure.  Scheduled for September 19,2019  Follow-Up: You are scheduled to see K. Grandville Silos, [place] on September 19,2019  Any Other Special Instructions Will Be Listed Below (If Applicable).   Your physician discussed the importance of taking an antibiotic prior to any dental, gastrointestinal, genitourinary procedures to prevent damage to the heart valves from infection.      If you need a refill on your cardiac medications before your next appointment, please call your pharmacy.      Signed, Angelena Form, PA-C  03/12/2018 4:22 PM    [facility] [place], [place], [place]  [address] Phone: [phone]; Fax: [phone]

## 2018-03-12 NOTE — Patient Instructions (Addendum)
Medication Instructions:  Your physician recommends that you continue on your current medications as directed. Please refer to the Current Medication list given to you today.   Labwork: none  Testing/Procedures: Your physician has requested that you have an echocardiogram. Echocardiography is a painless test that uses sound waves to create images of your heart. It provides your doctor with information about the size and shape of your heart and how well your heart's chambers and valves are working. This procedure takes approximately one hour. There are no restrictions for this procedure.  Scheduled for September 19,2019  Follow-Up: You are scheduled to see K. Grandville Silos, [place] on September 19,2019  Any Other Special Instructions Will Be Listed Below (If Applicable).   Your physician discussed the importance of taking an antibiotic prior to any dental, gastrointestinal, genitourinary procedures to prevent damage to the heart valves from infection.      If you need a refill on your cardiac medications before your next appointment, please call your pharmacy.

## 2018-03-19 DIAGNOSIS — Z9181 History of falling: Secondary | ICD-10-CM | POA: Diagnosis not present

## 2018-03-19 DIAGNOSIS — Z1339 Encounter for screening examination for other mental health and behavioral disorders: Secondary | ICD-10-CM | POA: Diagnosis not present

## 2018-03-19 DIAGNOSIS — Z6834 Body mass index (BMI) 34.0-34.9, adult: Secondary | ICD-10-CM | POA: Diagnosis not present

## 2018-03-19 DIAGNOSIS — Z952 Presence of prosthetic heart valve: Secondary | ICD-10-CM | POA: Diagnosis not present

## 2018-03-26 ENCOUNTER — Telehealth: Payer: Self-pay | Admitting: Cardiology

## 2018-03-26 DIAGNOSIS — I10 Essential (primary) hypertension: Secondary | ICD-10-CM | POA: Diagnosis not present

## 2018-03-26 DIAGNOSIS — Z954 Presence of other heart-valve replacement: Secondary | ICD-10-CM | POA: Diagnosis not present

## 2018-03-26 DIAGNOSIS — Z7982 Long term (current) use of aspirin: Secondary | ICD-10-CM | POA: Diagnosis not present

## 2018-03-26 DIAGNOSIS — B9689 Other specified bacterial agents as the cause of diseases classified elsewhere: Secondary | ICD-10-CM | POA: Diagnosis not present

## 2018-03-26 DIAGNOSIS — E860 Dehydration: Secondary | ICD-10-CM | POA: Diagnosis not present

## 2018-03-26 DIAGNOSIS — R110 Nausea: Secondary | ICD-10-CM | POA: Diagnosis not present

## 2018-03-26 DIAGNOSIS — R1010 Upper abdominal pain, unspecified: Secondary | ICD-10-CM | POA: Diagnosis not present

## 2018-03-26 DIAGNOSIS — E785 Hyperlipidemia, unspecified: Secondary | ICD-10-CM | POA: Diagnosis not present

## 2018-03-26 DIAGNOSIS — N3000 Acute cystitis without hematuria: Secondary | ICD-10-CM | POA: Diagnosis not present

## 2018-03-26 DIAGNOSIS — Z79899 Other long term (current) drug therapy: Secondary | ICD-10-CM | POA: Diagnosis not present

## 2018-03-26 DIAGNOSIS — I350 Nonrheumatic aortic (valve) stenosis: Secondary | ICD-10-CM | POA: Diagnosis not present

## 2018-03-26 NOTE — Telephone Encounter (Signed)
Wants his signature for cardiac rehab and records  Fax [phone]

## 2018-03-26 NOTE — Telephone Encounter (Signed)
Documents have been successfully faxed to Cardiac Rehab.

## 2018-04-01 DIAGNOSIS — Z6833 Body mass index (BMI) 33.0-33.9, adult: Secondary | ICD-10-CM | POA: Diagnosis not present

## 2018-04-01 DIAGNOSIS — R110 Nausea: Secondary | ICD-10-CM | POA: Diagnosis not present

## 2018-04-04 DIAGNOSIS — R110 Nausea: Secondary | ICD-10-CM | POA: Diagnosis not present

## 2018-04-04 DIAGNOSIS — E860 Dehydration: Secondary | ICD-10-CM | POA: Diagnosis not present

## 2018-04-04 DIAGNOSIS — B9689 Other specified bacterial agents as the cause of diseases classified elsewhere: Secondary | ICD-10-CM | POA: Diagnosis not present

## 2018-04-04 DIAGNOSIS — R1010 Upper abdominal pain, unspecified: Secondary | ICD-10-CM | POA: Diagnosis not present

## 2018-04-04 DIAGNOSIS — N3000 Acute cystitis without hematuria: Secondary | ICD-10-CM | POA: Diagnosis not present

## 2018-04-10 ENCOUNTER — Ambulatory Visit: Payer: Medicare Other | Admitting: Physician Assistant

## 2018-04-10 ENCOUNTER — Other Ambulatory Visit (HOSPITAL_COMMUNITY): Payer: Medicare Other

## 2018-04-15 DIAGNOSIS — L821 Other seborrheic keratosis: Secondary | ICD-10-CM | POA: Diagnosis not present

## 2018-04-15 DIAGNOSIS — E7800 Pure hypercholesterolemia, unspecified: Secondary | ICD-10-CM | POA: Diagnosis not present

## 2018-04-15 DIAGNOSIS — Z6833 Body mass index (BMI) 33.0-33.9, adult: Secondary | ICD-10-CM | POA: Diagnosis not present

## 2018-04-15 DIAGNOSIS — R252 Cramp and spasm: Secondary | ICD-10-CM | POA: Diagnosis not present

## 2018-04-15 DIAGNOSIS — D225 Melanocytic nevi of trunk: Secondary | ICD-10-CM | POA: Diagnosis not present

## 2018-04-15 DIAGNOSIS — Z23 Encounter for immunization: Secondary | ICD-10-CM | POA: Diagnosis not present

## 2018-04-15 DIAGNOSIS — Z0000 Encounter for general adult medical examination without abnormal findings: Secondary | ICD-10-CM | POA: Diagnosis not present

## 2018-04-15 DIAGNOSIS — D1801 Hemangioma of skin and subcutaneous tissue: Secondary | ICD-10-CM | POA: Diagnosis not present

## 2018-04-15 DIAGNOSIS — D2239 Melanocytic nevi of other parts of face: Secondary | ICD-10-CM | POA: Diagnosis not present

## 2018-04-16 ENCOUNTER — Ambulatory Visit (INDEPENDENT_AMBULATORY_CARE_PROVIDER_SITE_OTHER): Payer: Medicare Other | Admitting: Sports Medicine

## 2018-04-16 ENCOUNTER — Encounter: Payer: Self-pay | Admitting: Sports Medicine

## 2018-04-16 VITALS — BP 144/83 | HR 75 | Resp 16

## 2018-04-16 DIAGNOSIS — B351 Tinea unguium: Secondary | ICD-10-CM | POA: Diagnosis not present

## 2018-04-16 DIAGNOSIS — M79676 Pain in unspecified toe(s): Secondary | ICD-10-CM | POA: Diagnosis not present

## 2018-04-16 DIAGNOSIS — L608 Other nail disorders: Secondary | ICD-10-CM

## 2018-04-16 DIAGNOSIS — D689 Coagulation defect, unspecified: Secondary | ICD-10-CM

## 2018-04-16 NOTE — Progress Notes (Signed)
Subjective: Judith Solis is a 72 y.o. female patient seen today in office with complaint of mildly painful thickened and elongated toenails; unable to trim. Patient denies any changes with meds or medical history since last visit besides having aortic valve replacement and being placed on Plavix.  Patient has no other pedal complaints at this time.   Patient Active Problem List   Diagnosis Date Noted  . 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 Severe aortic stenosis   . DDD (degenerative disc disease), cervical 09/12/2017  . Cervical spondylosis without myelopathy 08/29/2017  . Hyperlipidemia 04/05/2017  . Essential hypertension 04/05/2017  . PVCs (premature ventricular contractions) 10/21/2016    Current Outpatient Medications on File Prior to Visit  Medication Sig Dispense Refill  . aspirin EC 81 MG tablet Take 81 mg by mouth at bedtime.     Marland Kitchen atorvastatin (LIPITOR) 10 MG tablet Take 10 mg by mouth at bedtime.     . cephALEXin (KEFLEX) 500 MG capsule Take 500 mg by mouth 2 (two) times daily.  0  . Cholecalciferol (VITAMIN D3) 5000 units TABS Take 5,000 Units by mouth at bedtime.    . citalopram (CELEXA) 20 MG tablet Take 20 mg by mouth daily.     . clobetasol cream (TEMOVATE) 7.25 % Apply 1 application topically 2 (two) times daily as needed (vaginal irritation).    . clopidogrel (PLAVIX) 75 MG tablet Take 1 tablet (75 mg total) by mouth daily with breakfast. 90 tablet 1  . losartan-hydrochlorothiazide (HYZAAR) 50-12.5 MG tablet TAKE 1 TABLET EVERY DAY 90 tablet 1  . magnesium gluconate (MAGONATE) 500 MG tablet Take 500 mg by mouth at bedtime.    . Multiple Vitamin (MULTI-VITAMINS) TABS Take 1 tablet by mouth daily.     . ondansetron (ZOFRAN) 4 MG tablet TAKE 1 TABLET BY MOUTH EVERY 6 TO 8 HOURS AS NEEDED FOR NAUSEA  0  . ondansetron (ZOFRAN) 8 MG tablet     . pantoprazole (PROTONIX) 40 MG tablet Take 40 mg by mouth daily.    . pramipexole (MIRAPEX) 0.125 MG  tablet Take 0.125 mg by mouth every night at bedtime    . traZODone (DESYREL) 50 MG tablet Take 50 mg by mouth at bedtime.     . valACYclovir (VALTREX) 500 MG tablet valacyclovir 500 mg tablet    . VENTOLIN HFA 108 (90 Base) MCG/ACT inhaler Inhale 1 puff into the lungs every 6 (six) hours as needed for wheezing or shortness of breath.      No current facility-administered medications on file prior to visit.     Allergies  Allergen Reactions  . Codeine Nausea And Vomiting  . Oxycodone-Acetaminophen Other (See Comments)    Hallucinations    Objective: Physical Exam  General: Well developed, nourished, no acute distress, awake, alert and oriented x 3  Vascular: Dorsalis pedis artery 1/4 bilateral, Posterior tibial artery 1/4 bilateral, skin temperature warm to warm proximal to distal bilateral lower extremities, moderate varicosities, no pedal hair present bilateral.  Neurological: Gross sensation present via light touch bilateral.   Dermatological: Skin is warm, dry, and supple bilateral, Nails 1-10 are tender, long with the most length on bilateral first and fourth toenails, thick, and discolored with mild subungal debris, no webspace macerations present bilateral, no open lesions present bilateral, no callus/corns/hyperkeratotic tissue present bilateral. No signs of infection bilateral.  Musculoskeletal: Asymptomatic bunion boney deformities noted bilateral. Muscular strength within normal limits without painon range of motion. No pain with calf compression  bilateral.  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  -Discussed treatment options for painful mycotic nails. -Mechanically debrided and reduced mycotic nails with sterile nail nipper and  dremel nail file without incident. -Patient to return in 3 months for follow up evaluation or sooner if symptoms worsen.  [name], DPM

## 2018-04-17 ENCOUNTER — Other Ambulatory Visit: Payer: Self-pay

## 2018-04-17 ENCOUNTER — Ambulatory Visit (HOSPITAL_COMMUNITY): Payer: Medicare Other | Attending: Cardiovascular Disease

## 2018-04-17 ENCOUNTER — Ambulatory Visit (INDEPENDENT_AMBULATORY_CARE_PROVIDER_SITE_OTHER): Payer: Medicare Other | Admitting: Physician Assistant

## 2018-04-17 VITALS — BP 120/80 | HR 86 | Ht 66.0 in

## 2018-04-17 DIAGNOSIS — Z952 Presence of prosthetic heart valve: Secondary | ICD-10-CM | POA: Insufficient documentation

## 2018-04-17 DIAGNOSIS — I110 Hypertensive heart disease with heart failure: Secondary | ICD-10-CM | POA: Insufficient documentation

## 2018-04-17 DIAGNOSIS — E669 Obesity, unspecified: Secondary | ICD-10-CM | POA: Diagnosis not present

## 2018-04-17 DIAGNOSIS — I10 Essential (primary) hypertension: Secondary | ICD-10-CM | POA: Diagnosis not present

## 2018-04-17 DIAGNOSIS — I509 Heart failure, unspecified: Secondary | ICD-10-CM | POA: Insufficient documentation

## 2018-04-17 DIAGNOSIS — E785 Hyperlipidemia, unspecified: Secondary | ICD-10-CM

## 2018-04-17 NOTE — Progress Notes (Signed)
[facility] [place]                                       Cardiology Office Note    Date:  04/17/2018   ID:  Blakelyn Dinges, DOB 1946-03-03, MRN [id number]  PCP:  Ronita Hipps, MD  Cardiologist: Dr. Bettina Gavia / Dr. Burt Knack & Dr. Roxy Manns (TAVR)  CC: 1 month s/p TAVR  History of Present Illness:  Judith Solis is a 72 y.o. 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  The patient states that she has known of the presence of a heart murmur for many years. 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 This has dramatically limited her mobility over the last several years, and the patient previously was scheduled for elective total ankle replacement last year. She underwent a transthoracic echocardiogram demonstrating the presence of moderate aortic stenosis with preserved left ventricular systolic function. She was seen in consultation by Dr. Bettina Gavia and elective ankle surgery was postponed indefinitely. Over the past 6 months the patient has developed worsening symptoms of exertional shortness of breath. Over the past 3 months symptoms have become limiting, and the patient now complains that she gets short of breath with moderate level activity.She was seen in follow-up by Dr. Bettina Gavia and repeat echocardiogram revealed significant progression of disease. Peak velocity across the aortic valve measured 4.3 m/s corresponding to mean transvalvular gradient estimated 41 mmHg. The DVI was measured 0.24 and aortic valve area calculated 0.92 cm.Left ventricular systolic function was mildly reduced with ejection fraction estimated 50 to 55%. The patient subsequently underwent diagnostic cardiac  catheterization confirming the presence of severe aortic stenosis with mean transvalvular gradient measured 53 mmHg corresponding to aortic valve area estimated 0.6 cm. Patient was found to have normal coronary arteries with no significant coronary artery disease.  She underwent successful TAVR with a 26 mm Edwards Sapien THV via the TF approach on 03/04/18. Post operative echo showed EF 55% with normally functioning TAVR valve with no PVL and mean gradient 10 mmHg. She was discharged on ASA and plavix.   Today she presents to clinic for follow up. She has been participating in cardiac rehab and loving it. They are teaching her about stress relief and healthy eating. She has lost 12 lbs. Her shortness of breath is much improved since TAVR. She was able to rakes leaves the other day with no issues of shortness of breath but did get a little fatigued afterwards. No CP. No LE edema, orthopnea or PND. No dizziness or syncope. No blood in stool or urine. No palpitations.      Past Medical History:  Diagnosis Date  . Aortic valve regurgitation   . Arthritis   . Asthma   . Breast cancer ([place])    a. s/p chemo and radiation  . GERD (gastroesophageal reflux disease)   . Hyperlipidemia   . Hypertension   . Morbid obesity ([place])   . 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 Severe aortic stenosis  Past Surgical History:  Procedure Laterality Date  . ABDOMINAL HYSTERECTOMY    . BREAST SURGERY     LUMPECTOMY WITH CHEMO & RADIATION  . CATARACT EXTRACTION    . EYE SURGERY    . INTRAOPERATIVE TRANSTHORACIC ECHOCARDIOGRAM  03/04/2018   Procedure: INTRAOPERATIVE TRANSTHORACIC ECHOCARDIOGRAM;  Surgeon: Sherren Mocha, MD;  Location: [facility] OR;  Service: Open Heart Surgery;;  . JOINT REPLACEMENT     BILATERAL HIPS 2007 & 2008  . RIGHT/LEFT HEART CATH AND CORONARY ANGIOGRAPHY N/A  01/31/2018   Procedure: RIGHT/LEFT HEART CATH AND CORONARY ANGIOGRAPHY;  Surgeon: Burnell Blanks, MD;  Location: [facility];  Service: Cardiovascular;  Laterality: N/A;  . ROTATOR CUFF REPAIR    . TONSILLECTOMY    . TRANSCATHETER AORTIC VALVE REPLACEMENT, TRANSFEMORAL N/A 03/04/2018   Procedure: TRANSCATHETER AORTIC VALVE REPLACEMENT, TRANSFEMORAL. EDWARDS 26MM SAPIEN 3 TRANSCATHETER HEART VALVE.;  Surgeon: Sherren Mocha, MD;  Location: [facility];  Service: Open Heart Surgery;  Laterality: N/A;    Current Medications: Outpatient Medications Prior to Visit  Medication Sig Dispense Refill  . aspirin EC 81 MG tablet Take 81 mg by mouth at bedtime.     Marland Kitchen atorvastatin (LIPITOR) 10 MG tablet Take 10 mg by mouth at bedtime.     . Cholecalciferol (VITAMIN D3) 5000 units TABS Take 5,000 Units by mouth at bedtime.    . citalopram (CELEXA) 20 MG tablet Take 10 mg by mouth daily.    . clobetasol cream (TEMOVATE) 1.61 % Apply 1 application topically 2 (two) times daily as needed (vaginal irritation).    . clopidogrel (PLAVIX) 75 MG tablet Take 1 tablet (75 mg total) by mouth daily with breakfast. 90 tablet 1  . losartan-hydrochlorothiazide (HYZAAR) 50-12.5 MG tablet TAKE 1 TABLET EVERY DAY 90 tablet 1  . magnesium gluconate (MAGONATE) 500 MG tablet Take 500 mg by mouth at bedtime.    . Multiple Vitamin (MULTI-VITAMINS) TABS Take 1 tablet by mouth daily.     . pantoprazole (PROTONIX) 40 MG tablet Take 40 mg by mouth daily.    . pramipexole (MIRAPEX) 0.125 MG tablet Take 0.125 mg by mouth every night at bedtime    . traZODone (DESYREL) 50 MG tablet Take 50 mg by mouth at bedtime.     . valACYclovir (VALTREX) 500 MG tablet valacyclovir 500 mg tablet    . VENTOLIN HFA 108 (90 Base) MCG/ACT inhaler Inhale 1 puff into the lungs every 6 (six) hours as needed for wheezing or shortness of breath.     . cephALEXin (KEFLEX) 500 MG capsule Take 500 mg by mouth 2 (two) times daily.  0  . ondansetron  (ZOFRAN) 4 MG tablet TAKE 1 TABLET BY MOUTH EVERY 6 TO 8 HOURS AS NEEDED FOR NAUSEA  0  . ondansetron (ZOFRAN) 8 MG tablet      No facility-administered medications prior to visit.      Allergies:   Codeine and Oxycodone-acetaminophen   Social History   Socioeconomic History  . Marital status: Widowed    Spouse name: Not on file  . Number of children: Not on file  . Years of education: Not on file  . Highest education level: Not on file  Occupational History  . Not on file  Social Needs  . Financial resource strain: Not on file  . Food insecurity:    Worry: Not on file    Inability: Not on file  . Transportation needs:    Medical: Not on file    Non-medical:  Not on file  Tobacco Use  . Smoking status: Former Smoker    Packs/day: 1.50    Years: 20.00    Pack years: 30.00    Types: Cigarettes    Last attempt to quit: 06/04/1990    Years since quitting: 27.8  . Smokeless tobacco: Never Used  Substance and Sexual Activity  . Alcohol use: No  . Drug use: No  . Sexual activity: Not on file  Lifestyle  . Physical activity:    Days per week: Not on file    Minutes per session: Not on file  . Stress: Not on file  Relationships  . Social connections:    Talks on phone: Not on file    Gets together: Not on file    Attends religious service: Not on file    Active member of club or organization: Not on file    Attends meetings of clubs or organizations: Not on file    Relationship status: Not on file  Other Topics Concern  . Not on file  Social History Narrative  . Not on file     Family History:  The patient's family history includes Diabetes Mellitus II in her paternal grandfather, sister, and sister; Heart Problems in her brother and sister; Heart disease in her brother, maternal grandfather, and mother; Hepatitis C in her sister; Lung cancer in her sister; Stroke in her father; Throat cancer in her sister.     ROS:   Please see the history of present illness.      ROS All other systems reviewed and are negative.   PHYSICAL EXAM:   VS:  BP 120/80   Pulse 86   Ht 5\' 6"  (1.676 m)   BMI 33.54 kg/m    GEN: Well nourished, well developed, in no acute distress  HEENT: normal  Neck: no JVD or masses Cardiac: RRR; soft flow murmur heard best at RUSB. No rubs, or gallops,no edema  Respiratory:  clear to auscultation bilaterally, normal work of breathing GI: soft, nontender, nondistended, + BS MS: no deformity or atrophy  Skin: warm and dry, no rash Neuro:  Alert and Oriented x 3, Strength and sensation are intact Psych: euthymic mood, full affect     Wt Readings from Last 3 Encounters:  03/12/18 207 lb 12.8 oz (94.3 kg)  03/06/18 213 lb 1.6 oz (96.7 kg)  02/27/18 210 lb 14.4 oz (95.7 kg)      Studies/Labs Reviewed:   EKG:  EKG is NOT ordered today.    Recent Labs: 02/27/2018: ALT 40; B Natriuretic Peptide 63.0 03/05/2018: Magnesium 2.0 03/06/2018: BUN 15; Creatinine, Ser 0.91; Hemoglobin 11.6; Platelets 171; Potassium 4.5; Sodium 138   Lipid Panel No results found for: CHOL, TRIG, HDL, CHOLHDL, VLDL, LDLCALC, LDLDIRECT  Additional studies/ records that were reviewed today include:  TAVR OPERATIVE NOTE   Date of Procedure:03/04/2018  Preoperative Diagnosis:Severe Aortic Stenosis   Procedure:  Transcatheter Aortic Valve Replacement - Percutaneous Transfemoral Approach Edwards Sapien 3 THV (size 1mm, model # 9600TFX, serial # [id number])  Co-Surgeons:Clarence H. Roxy Manns, MD, MD and Sherren Mocha, MD  Pre-operative Echo Findings: ? Severe aortic stenosis ? Normalleft ventricular systolic function  Post-operative Echo Findings: ? Noparavalvular leak ? Normalleft ventricular systolic function   ____________________   Post operative echo 03/05/18  Study Conclusions - Left ventricle: The cavity size was normal. Systolic function was normal. The  estimated ejection fraction was in the range of 55% to 60%. Wall motion was normal; there were no regional  wall motion abnormalities. Features are consistent with a pseudonormal left ventricular filling pattern, with concomitant abnormal relaxation and increased filling pressure (grade 2 diastolic dysfunction). Doppler parameters are consistent with high ventricular filling pressure. - Aortic valve: S/P TAVR well functioning with no perivalvular AI. Mean gradient (S): 10 mm Hg. Valve area (VTI): 1.65 cm^2. Valve area (Vmax): 1.59 cm^2. Valve area (Vmean): 1.58 cm^2. - Mitral valve: Calcified annulus. There was trivial regurgitation. - Pulmonary arteries: Systolic pressure could not be accurately estimated. Impressions: - Compared to prior echo, TAVR is now present and LVF has improved.  _________________  2D ECHO 04/17/18 (1 month post op) Study Conclusions  - Left ventricle: The cavity size was normal. There was mild   concentric hypertrophy. Systolic function was normal. The   estimated ejection fraction was in the range of 55% to 60%. Wall   motion was normal; there were no regional wall motion   abnormalities. Doppler parameters are consistent with abnormal   left ventricular relaxation (grade 1 diastolic dysfunction). - Aortic valve: A stent-valve (TAVR) bioprosthesis was present and   functioning normally. Valve area (VTI): 1.84 cm^2. Valve area   (Vmax): 1.42 cm^2. Valve area (Vmean): 1.24 cm^2. - Mitral valve: Calcified annulus. - Left atrium: The atrium was mildly dilated. Impressions: - Aortic prosthesis gradients are unchanged from the previous   study.   ASSESSMENT & PLAN:   Severe AS s/p TAVR: 2D ECHO today shows EF 55%, normally functioning TAVR with no PVL; mean gradient 37mm Hg. Doing excellent s/p TAVR and has NYHA class I symptoms. She has been working with cardiac rehab and really enjoying it. SBE prophylaxis discussed; her dentist  prescribes Amoxicillin. Plavix can be discontinued after 6 months of therapy ( 08/2018). She will continue on Aspirin indefinitely. I will see her back in 1 year with an echo. She will see Dr. Bettina Gavia in 3 months .   HTN: BP well controlled   HLD: continue statin   Obesity:working hard on this. She has lost about ~12 lbs with diet and exercise  Medication Adjustments/Labs and Tests Ordered: Current medicines are reviewed at length with the patient today.  Concerns regarding medicines are outlined above.  Medication changes, Labs and Tests ordered today are listed in the Patient Instructions below. Patient Instructions  Medication Instructions:  1) You may stop Plavix after 09/04/2018.   Labwork: None  Testing/Procedures: None  Follow-Up: Your physician recommends that you schedule a follow-up appointment in: 3 months with Dr. Bettina Gavia.    You will follow up with Bonney Leitz, PA-C in one year.  We will do an echocardiogram the same day.  Theodosia Quay, RN or Bonney Leitz, PA-C will contact you to schedule this when it gets closer.   Any Other Special Instructions Will Be Listed Below (If Applicable).     If you need a refill on your cardiac medications before your next appointment, please call your pharmacy.      Signed, Angelena Form, PA-C  04/17/2018 7:02 PM    [facility] [address], [place], [place]  [address] Phone: [phone]; Fax: [phone]

## 2018-04-17 NOTE — Patient Instructions (Addendum)
Medication Instructions:  1) You may stop Plavix after 09/04/2018.   Labwork: None  Testing/Procedures: None  Follow-Up: Your physician recommends that you schedule a follow-up appointment in: 3 months with Dr. Bettina Gavia.    You will follow up with Bonney Leitz, PA-C in one year.  We will do an echocardiogram the same day.  Theodosia Quay, RN or Bonney Leitz, PA-C will contact you to schedule this when it gets closer.   Any Other Special Instructions Will Be Listed Below (If Applicable).     If you need a refill on your cardiac medications before your next appointment, please call your pharmacy.

## 2018-04-21 ENCOUNTER — Ambulatory Visit: Payer: Medicare Other | Admitting: Cardiology

## 2018-04-23 DIAGNOSIS — Z954 Presence of other heart-valve replacement: Secondary | ICD-10-CM | POA: Diagnosis not present

## 2018-04-23 DIAGNOSIS — Z955 Presence of coronary angioplasty implant and graft: Secondary | ICD-10-CM | POA: Diagnosis not present

## 2018-04-25 ENCOUNTER — Encounter: Payer: Self-pay | Admitting: Thoracic Surgery (Cardiothoracic Vascular Surgery)

## 2018-04-25 DIAGNOSIS — Z955 Presence of coronary angioplasty implant and graft: Secondary | ICD-10-CM | POA: Diagnosis not present

## 2018-04-25 DIAGNOSIS — Z954 Presence of other heart-valve replacement: Secondary | ICD-10-CM | POA: Diagnosis not present

## 2018-04-28 DIAGNOSIS — Z954 Presence of other heart-valve replacement: Secondary | ICD-10-CM | POA: Diagnosis not present

## 2018-04-28 DIAGNOSIS — Z955 Presence of coronary angioplasty implant and graft: Secondary | ICD-10-CM | POA: Diagnosis not present

## 2018-04-30 DIAGNOSIS — Z954 Presence of other heart-valve replacement: Secondary | ICD-10-CM | POA: Diagnosis not present

## 2018-04-30 DIAGNOSIS — Z955 Presence of coronary angioplasty implant and graft: Secondary | ICD-10-CM | POA: Diagnosis not present

## 2018-05-02 DIAGNOSIS — Z954 Presence of other heart-valve replacement: Secondary | ICD-10-CM | POA: Diagnosis not present

## 2018-05-02 DIAGNOSIS — Z955 Presence of coronary angioplasty implant and graft: Secondary | ICD-10-CM | POA: Diagnosis not present

## 2018-05-05 DIAGNOSIS — Z955 Presence of coronary angioplasty implant and graft: Secondary | ICD-10-CM | POA: Diagnosis not present

## 2018-05-05 DIAGNOSIS — Z954 Presence of other heart-valve replacement: Secondary | ICD-10-CM | POA: Diagnosis not present

## 2018-05-07 DIAGNOSIS — Z954 Presence of other heart-valve replacement: Secondary | ICD-10-CM | POA: Diagnosis not present

## 2018-05-07 DIAGNOSIS — Z955 Presence of coronary angioplasty implant and graft: Secondary | ICD-10-CM | POA: Diagnosis not present

## 2018-05-09 DIAGNOSIS — Z955 Presence of coronary angioplasty implant and graft: Secondary | ICD-10-CM | POA: Diagnosis not present

## 2018-05-09 DIAGNOSIS — Z954 Presence of other heart-valve replacement: Secondary | ICD-10-CM | POA: Diagnosis not present

## 2018-05-12 DIAGNOSIS — Z955 Presence of coronary angioplasty implant and graft: Secondary | ICD-10-CM | POA: Diagnosis not present

## 2018-05-12 DIAGNOSIS — Z954 Presence of other heart-valve replacement: Secondary | ICD-10-CM | POA: Diagnosis not present

## 2018-05-14 DIAGNOSIS — Z955 Presence of coronary angioplasty implant and graft: Secondary | ICD-10-CM | POA: Diagnosis not present

## 2018-05-14 DIAGNOSIS — Z954 Presence of other heart-valve replacement: Secondary | ICD-10-CM | POA: Diagnosis not present

## 2018-05-16 DIAGNOSIS — Z955 Presence of coronary angioplasty implant and graft: Secondary | ICD-10-CM | POA: Diagnosis not present

## 2018-05-16 DIAGNOSIS — Z954 Presence of other heart-valve replacement: Secondary | ICD-10-CM | POA: Diagnosis not present

## 2018-05-19 ENCOUNTER — Telehealth: Payer: Self-pay | Admitting: Cardiology

## 2018-05-19 DIAGNOSIS — Z955 Presence of coronary angioplasty implant and graft: Secondary | ICD-10-CM | POA: Diagnosis not present

## 2018-05-19 DIAGNOSIS — Z954 Presence of other heart-valve replacement: Secondary | ICD-10-CM | POA: Diagnosis not present

## 2018-05-19 NOTE — Telephone Encounter (Signed)
Patient walked in stating that the Plavix is making her really itchy, Please call patient.

## 2018-05-19 NOTE — Telephone Encounter (Signed)
Please ask the TAVR team if she can stop plavix-itching and rash, or alternative after TAVR

## 2018-05-19 NOTE — Telephone Encounter (Signed)
Patient states that she has been having generalized itching for the past several weeks. She started plavix 75 mg daily after TAVR placement on 03/04/18. Patient wanted to make [place] aware and see if Dr. Bettina Gavia recommending changing medication; however, she knows alternative medication is expensive so she is willing to stay on plavix and see if symptoms improve. Will have Dr. Bettina Gavia advise.

## 2018-05-20 ENCOUNTER — Other Ambulatory Visit: Payer: Self-pay | Admitting: Physician Assistant

## 2018-05-20 ENCOUNTER — Telehealth: Payer: Self-pay | Admitting: Physician Assistant

## 2018-05-20 MED ORDER — ASPIRIN 325 MG PO TABS: 325.0000 mg | ORAL_TABLET | Freq: Every day | ORAL | 3 refills | Status: AC

## 2018-05-20 NOTE — Telephone Encounter (Signed)
Discussed with team and we will plan to have her take full dose Aspirin 325mg  and STOP plavix. After 6 months of full dose asprin, she can go back to a daily baby aspirin. I am happy to reach out to patient to discuss. Thank you

## 2018-05-20 NOTE — Telephone Encounter (Signed)
Patient has been contacted and is understanding of the instructions.

## 2018-05-20 NOTE — Telephone Encounter (Signed)
Please contact her

## 2018-05-20 NOTE — Telephone Encounter (Signed)
  [facility]   MULTIDISCIPLINARY HEART VALVE TEAM  I spoke with patient about itching with plavix. Discussed with multidisciplinary valve team and we will plan to have her take full dose Aspirin 325mg  and STOP plavix. After 6 months of full dose asprin, she can go back to a daily baby aspirin. She verbalized understanding of plan and appreciated call back. She knows to call the structural heart office with any further questions on the matter. Med list has been updated.   Angelena Form PA-C  [facility]

## 2018-05-21 DIAGNOSIS — Z954 Presence of other heart-valve replacement: Secondary | ICD-10-CM | POA: Diagnosis not present

## 2018-05-21 DIAGNOSIS — Z955 Presence of coronary angioplasty implant and graft: Secondary | ICD-10-CM | POA: Diagnosis not present

## 2018-05-23 DIAGNOSIS — Z954 Presence of other heart-valve replacement: Secondary | ICD-10-CM | POA: Diagnosis not present

## 2018-05-26 DIAGNOSIS — Z954 Presence of other heart-valve replacement: Secondary | ICD-10-CM | POA: Diagnosis not present

## 2018-05-28 DIAGNOSIS — Z954 Presence of other heart-valve replacement: Secondary | ICD-10-CM | POA: Diagnosis not present

## 2018-05-30 DIAGNOSIS — Z954 Presence of other heart-valve replacement: Secondary | ICD-10-CM | POA: Diagnosis not present

## 2018-06-02 DIAGNOSIS — Z954 Presence of other heart-valve replacement: Secondary | ICD-10-CM | POA: Diagnosis not present

## 2018-06-04 DIAGNOSIS — M75101 Unspecified rotator cuff tear or rupture of right shoulder, not specified as traumatic: Secondary | ICD-10-CM | POA: Diagnosis not present

## 2018-06-04 DIAGNOSIS — Z954 Presence of other heart-valve replacement: Secondary | ICD-10-CM | POA: Diagnosis not present

## 2018-06-04 DIAGNOSIS — M25511 Pain in right shoulder: Secondary | ICD-10-CM | POA: Diagnosis not present

## 2018-06-06 DIAGNOSIS — Z954 Presence of other heart-valve replacement: Secondary | ICD-10-CM | POA: Diagnosis not present

## 2018-06-09 DIAGNOSIS — Z954 Presence of other heart-valve replacement: Secondary | ICD-10-CM | POA: Diagnosis not present

## 2018-06-11 DIAGNOSIS — Z954 Presence of other heart-valve replacement: Secondary | ICD-10-CM | POA: Diagnosis not present

## 2018-06-12 ENCOUNTER — Other Ambulatory Visit: Payer: Self-pay

## 2018-06-13 DIAGNOSIS — M25511 Pain in right shoulder: Secondary | ICD-10-CM | POA: Diagnosis not present

## 2018-06-13 DIAGNOSIS — M6281 Muscle weakness (generalized): Secondary | ICD-10-CM | POA: Diagnosis not present

## 2018-06-13 DIAGNOSIS — M2560 Stiffness of unspecified joint, not elsewhere classified: Secondary | ICD-10-CM | POA: Diagnosis not present

## 2018-06-13 DIAGNOSIS — Z954 Presence of other heart-valve replacement: Secondary | ICD-10-CM | POA: Diagnosis not present

## 2018-06-16 DIAGNOSIS — Z954 Presence of other heart-valve replacement: Secondary | ICD-10-CM | POA: Diagnosis not present

## 2018-06-16 DIAGNOSIS — M25511 Pain in right shoulder: Secondary | ICD-10-CM | POA: Diagnosis not present

## 2018-06-16 DIAGNOSIS — M2560 Stiffness of unspecified joint, not elsewhere classified: Secondary | ICD-10-CM | POA: Diagnosis not present

## 2018-06-16 DIAGNOSIS — M6281 Muscle weakness (generalized): Secondary | ICD-10-CM | POA: Diagnosis not present

## 2018-06-17 DIAGNOSIS — Z954 Presence of other heart-valve replacement: Secondary | ICD-10-CM | POA: Diagnosis not present

## 2018-06-18 DIAGNOSIS — M25511 Pain in right shoulder: Secondary | ICD-10-CM | POA: Diagnosis not present

## 2018-06-18 DIAGNOSIS — M2560 Stiffness of unspecified joint, not elsewhere classified: Secondary | ICD-10-CM | POA: Diagnosis not present

## 2018-06-18 DIAGNOSIS — M6281 Muscle weakness (generalized): Secondary | ICD-10-CM | POA: Diagnosis not present

## 2018-06-24 DIAGNOSIS — M2560 Stiffness of unspecified joint, not elsewhere classified: Secondary | ICD-10-CM | POA: Diagnosis not present

## 2018-06-24 DIAGNOSIS — M25511 Pain in right shoulder: Secondary | ICD-10-CM | POA: Diagnosis not present

## 2018-06-24 DIAGNOSIS — M6281 Muscle weakness (generalized): Secondary | ICD-10-CM | POA: Diagnosis not present

## 2018-06-26 DIAGNOSIS — M2560 Stiffness of unspecified joint, not elsewhere classified: Secondary | ICD-10-CM | POA: Diagnosis not present

## 2018-06-26 DIAGNOSIS — M25511 Pain in right shoulder: Secondary | ICD-10-CM | POA: Diagnosis not present

## 2018-06-26 DIAGNOSIS — M6281 Muscle weakness (generalized): Secondary | ICD-10-CM | POA: Diagnosis not present

## 2018-06-30 DIAGNOSIS — M25511 Pain in right shoulder: Secondary | ICD-10-CM | POA: Diagnosis not present

## 2018-06-30 DIAGNOSIS — M2560 Stiffness of unspecified joint, not elsewhere classified: Secondary | ICD-10-CM | POA: Diagnosis not present

## 2018-06-30 DIAGNOSIS — M6281 Muscle weakness (generalized): Secondary | ICD-10-CM | POA: Diagnosis not present

## 2018-07-03 DIAGNOSIS — M6281 Muscle weakness (generalized): Secondary | ICD-10-CM | POA: Diagnosis not present

## 2018-07-03 DIAGNOSIS — M25511 Pain in right shoulder: Secondary | ICD-10-CM | POA: Diagnosis not present

## 2018-07-03 DIAGNOSIS — M2560 Stiffness of unspecified joint, not elsewhere classified: Secondary | ICD-10-CM | POA: Diagnosis not present

## 2018-07-07 NOTE — Progress Notes (Signed)
Cardiology Office Note:    Date:  07/08/2018   ID:  Judith Solis, DOB 1945-11-21, MRN [id number]  PCP:  Ronita Hipps, MD  Cardiologist:  Shirlee More, MD    Referring MD: Ronita Hipps, MD    ASSESSMENT:    1. S/P TAVR (transcatheter aortic valve replacement)   2. Essential hypertension   3. Pure hypercholesterolemia    PLAN:    In order of problems listed above:  1. She has had a remarkable improvement after intervention and with cardiac rehab the best she is felt in years her previous chronic fatigue is resolved and her lower extremity joint pain is markedly improved and she does not plan to have surgical intervention for her ankle.  I will schedule for an echocardiogram and a 1 year follow-up August September 2020.  She takes aspirin for antiplatelet therapy was intolerant of clopidogrel with rash 2. Stable continue her current treatment BP at target 3. Stable continue her statin , most recent lipid profile LDL 108   Next appointment: September 2020   Medication Adjustments/Labs and Tests Ordered: Current medicines are reviewed at length with the patient today.  Concerns regarding medicines are outlined above.  Orders Placed This Encounter  Procedures  . ECHOCARDIOGRAM COMPLETE   No orders of the defined types were placed in this encounter.   Chief Complaint  Patient presents with  . Follow-up    after TAVR finished cardiac rehab  . Hypertension  . Hyperlipidemia    History of Present Illness:    Judith Solis is a 72 y.o. female with a hx of hypertension, hyperlipidemia and aortic stenosis with  TAVR 03/04/18 last seen by me 01/27/18.  She has enrolled in and participate in cardiac rehabilitation with steady improvement. Compliance with diet, lifestyle and medications: Yes  She is very appreciative she tells me her life was saved and is the best she has felt in decades her previous fatigue exercise intolerance dyspnea have all resolved she continues to  exercise in the [facility] program and her lower extremity joint pain is remarkably improved and she is not having surgical intervention.  No edema shortness of breath chest pain palpitation or syncope recent labs are requested from her PCP office. Past Medical History:  Diagnosis Date  . Aortic valve regurgitation   . Arthritis   . Asthma   . Breast cancer ([place])    a. s/p chemo and radiation  . GERD (gastroesophageal reflux disease)   . Hyperlipidemia   . Hypertension   . Morbid obesity ([place])   . 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 Severe aortic stenosis     Past Surgical History:  Procedure Laterality Date  . ABDOMINAL HYSTERECTOMY    . BREAST SURGERY     LUMPECTOMY WITH CHEMO & RADIATION  . CATARACT EXTRACTION    . EYE SURGERY    . INTRAOPERATIVE TRANSTHORACIC ECHOCARDIOGRAM  03/04/2018   Procedure: INTRAOPERATIVE TRANSTHORACIC ECHOCARDIOGRAM;  Surgeon: Sherren Mocha, MD;  Location: [facility] OR;  Service: Open Heart Surgery;;  . JOINT REPLACEMENT     BILATERAL HIPS 2007 & 2008  . RIGHT/LEFT HEART CATH AND CORONARY ANGIOGRAPHY N/A 01/31/2018   Procedure: RIGHT/LEFT HEART CATH AND CORONARY ANGIOGRAPHY;  Surgeon: Burnell Blanks, MD;  Location: [facility];  Service: Cardiovascular;  Laterality: N/A;  . ROTATOR CUFF REPAIR    . TONSILLECTOMY    .  TRANSCATHETER AORTIC VALVE REPLACEMENT, TRANSFEMORAL N/A 03/04/2018   Procedure: TRANSCATHETER AORTIC VALVE REPLACEMENT, TRANSFEMORAL. EDWARDS 26MM SAPIEN 3 TRANSCATHETER HEART VALVE.;  Surgeon: Sherren Mocha, MD;  Location: [facility];  Service: Open Heart Surgery;  Laterality: N/A;    Current Medications: Current Meds  Medication Sig  . aspirin (BAYER ASPIRIN) 325 MG tablet Take 1 tablet (325 mg total) by mouth daily.  Marland Kitchen atorvastatin (LIPITOR) 10 MG tablet Take 10 mg by mouth at bedtime.    . clobetasol cream (TEMOVATE) 2.70 % Apply 1 application topically 2 (two) times daily as needed (vaginal irritation).  Marland Kitchen losartan-hydrochlorothiazide (HYZAAR) 50-12.5 MG tablet TAKE 1 TABLET EVERY DAY  . magnesium gluconate (MAGONATE) 500 MG tablet Take 500 mg by mouth at bedtime.  . pantoprazole (PROTONIX) 40 MG tablet Take 40 mg by mouth daily.  . pramipexole (MIRAPEX) 0.125 MG tablet Take 0.125 mg by mouth every night at bedtime  . traZODone (DESYREL) 50 MG tablet Take 50 mg by mouth at bedtime.   . VENTOLIN HFA 108 (90 Base) MCG/ACT inhaler Inhale 1 puff into the lungs every 6 (six) hours as needed for wheezing or shortness of breath.      Allergies:   Plavix [clopidogrel bisulfate]; Codeine; and Oxycodone-acetaminophen   Social History   Socioeconomic History  . Marital status: Widowed    Spouse name: Not on file  . Number of children: Not on file  . Years of education: Not on file  . Highest education level: Not on file  Occupational History  . Not on file  Social Needs  . Financial resource strain: Not on file  . Food insecurity:    Worry: Not on file    Inability: Not on file  . Transportation needs:    Medical: Not on file    Non-medical: Not on file  Tobacco Use  . Smoking status: Former Smoker    Packs/day: 1.50    Years: 20.00    Pack years: 30.00    Types: Cigarettes    Last attempt to quit: 06/04/1990    Years since quitting: 28.1  . Smokeless tobacco: Never Used  Substance and Sexual Activity  . Alcohol use: No  . Drug use: No  . Sexual activity: Not on file  Lifestyle  . Physical activity:    Days per week: Not on file    Minutes per session: Not on file  . Stress: Not on file  Relationships  . Social connections:    Talks on phone: Not on file    Gets together: Not on file    Attends religious service: Not on file    Active member of club or organization: Not on file    Attends meetings of clubs or organizations: Not on file    Relationship  status: Not on file  Other Topics Concern  . Not on file  Social History Narrative  . Not on file     Family History: The patient's family history includes Diabetes Mellitus II in her paternal grandfather, sister, and sister; Heart Problems in her brother and sister; Heart disease in her brother, maternal grandfather, and mother; Hepatitis C in her sister; Lung cancer in her sister; Stroke in her father; Throat cancer in her sister. ROS:   Please see the history of present illness.    All other systems reviewed and are negative.  EKGs/Labs/Other Studies Reviewed:    The following studies were reviewed today:   Her post procedure echocardiogram showed normal function of her TAVR  and normal left ventricular function.  Her baseline gradient mean was 10 peak 20. Recent Labs: 02/27/2018: ALT 40; B Natriuretic Peptide 63.0 03/05/2018: Magnesium 2.0 03/06/2018: BUN 15; Creatinine, Ser 0.91; Hemoglobin 11.6; Platelets 171; Potassium 4.5; Sodium 138  Recent Lipid Panel No results found for: CHOL, TRIG, HDL, CHOLHDL, VLDL, LDLCALC, LDLDIRECT  Physical Exam:    VS:  BP 120/80 (BP Location: Right Arm, Patient Position: Sitting, Cuff Size: Large)   Pulse 90   Ht 5\' 6"  (1.676 m)   Wt 200 lb (90.7 kg)   SpO2 95%   BMI 32.28 kg/m     Wt Readings from Last 3 Encounters:  07/08/18 200 lb (90.7 kg)  03/12/18 207 lb 12.8 oz (94.3 kg)  03/06/18 213 lb 1.6 oz (96.7 kg)     GEN:  Well nourished, well developed in no acute distress HEENT: Normal NECK: No JVD; No carotid bruits LYMPHATICS: No lymphadenopathy CARDIAC: RRR, no murmurs, rubs, gallops RESPIRATORY:  Clear to auscultation without rales, wheezing or rhonchi  ABDOMEN: Soft, non-tender, non-distended MUSCULOSKELETAL:  No edema; No deformity  SKIN: Warm and dry NEUROLOGIC:  Alert and oriented x 3 PSYCHIATRIC:  Normal affect    Signed, Shirlee More, MD  07/08/2018 10:38 AM    [place]

## 2018-07-08 ENCOUNTER — Ambulatory Visit: Payer: Medicare Other | Admitting: Cardiology

## 2018-07-08 ENCOUNTER — Encounter: Payer: Self-pay | Admitting: Cardiology

## 2018-07-08 ENCOUNTER — Ambulatory Visit (INDEPENDENT_AMBULATORY_CARE_PROVIDER_SITE_OTHER): Payer: Medicare Other | Admitting: Cardiology

## 2018-07-08 VITALS — BP 120/80 | HR 90 | Ht 66.0 in | Wt 200.0 lb

## 2018-07-08 DIAGNOSIS — I10 Essential (primary) hypertension: Secondary | ICD-10-CM

## 2018-07-08 DIAGNOSIS — M2560 Stiffness of unspecified joint, not elsewhere classified: Secondary | ICD-10-CM | POA: Diagnosis not present

## 2018-07-08 DIAGNOSIS — M25511 Pain in right shoulder: Secondary | ICD-10-CM | POA: Diagnosis not present

## 2018-07-08 DIAGNOSIS — E7800 Pure hypercholesterolemia, unspecified: Secondary | ICD-10-CM

## 2018-07-08 DIAGNOSIS — Z952 Presence of prosthetic heart valve: Secondary | ICD-10-CM | POA: Diagnosis not present

## 2018-07-08 DIAGNOSIS — M6281 Muscle weakness (generalized): Secondary | ICD-10-CM | POA: Diagnosis not present

## 2018-07-08 NOTE — Patient Instructions (Signed)
Medication Instructions:  Your physician recommends that you continue on your current medications as directed. Please refer to the Current Medication list given to you today.  If you need a refill on your cardiac medications before your next appointment, please call your pharmacy.   Lab work: NONE If you have labs (blood work) drawn today and your tests are completely normal, you will receive your results only by: Marland Kitchen MyChart Message (if you have MyChart) OR . A paper copy in the mail If you have any lab test that is abnormal or we need to change your treatment, we will call you to review the results.  Testing/Procedures: Your physician has requested that you have an echocardiogram. Echocardiography is a painless test that uses sound waves to create images of your heart. It provides your doctor with information about the size and shape of your heart and how well your heart's chambers and valves are working. This procedure takes approximately one hour. There are no restrictions for this procedure.  AUGUST 2020    Follow-Up: At [facility], you and your health needs are our priority.  As part of our continuing mission to provide you with exceptional heart care, we have created designated Provider Care Teams.  These Care Teams include your primary Cardiologist (physician) and Advanced Practice Providers (APPs -  Physician Assistants and Nurse Practitioners) who all work together to provide you with the care you need, when you need it. You will need a follow up appointment in September 2020.  You will have an echocardiogram in August 2020.

## 2018-07-10 DIAGNOSIS — E785 Hyperlipidemia, unspecified: Secondary | ICD-10-CM | POA: Diagnosis not present

## 2018-07-10 DIAGNOSIS — Z79899 Other long term (current) drug therapy: Secondary | ICD-10-CM | POA: Diagnosis not present

## 2018-07-10 DIAGNOSIS — M6281 Muscle weakness (generalized): Secondary | ICD-10-CM | POA: Diagnosis not present

## 2018-07-10 DIAGNOSIS — M2560 Stiffness of unspecified joint, not elsewhere classified: Secondary | ICD-10-CM | POA: Diagnosis not present

## 2018-07-10 DIAGNOSIS — M25511 Pain in right shoulder: Secondary | ICD-10-CM | POA: Diagnosis not present

## 2018-07-11 DIAGNOSIS — H524 Presbyopia: Secondary | ICD-10-CM | POA: Diagnosis not present

## 2018-07-11 DIAGNOSIS — H1851 Endothelial corneal dystrophy: Secondary | ICD-10-CM | POA: Diagnosis not present

## 2018-07-14 DIAGNOSIS — M2560 Stiffness of unspecified joint, not elsewhere classified: Secondary | ICD-10-CM | POA: Diagnosis not present

## 2018-07-14 DIAGNOSIS — M25511 Pain in right shoulder: Secondary | ICD-10-CM | POA: Diagnosis not present

## 2018-07-14 DIAGNOSIS — M6281 Muscle weakness (generalized): Secondary | ICD-10-CM | POA: Diagnosis not present

## 2018-07-17 DIAGNOSIS — M25511 Pain in right shoulder: Secondary | ICD-10-CM | POA: Diagnosis not present

## 2018-07-17 DIAGNOSIS — M2560 Stiffness of unspecified joint, not elsewhere classified: Secondary | ICD-10-CM | POA: Diagnosis not present

## 2018-07-17 DIAGNOSIS — M6281 Muscle weakness (generalized): Secondary | ICD-10-CM | POA: Diagnosis not present

## 2018-07-21 DIAGNOSIS — M19012 Primary osteoarthritis, left shoulder: Secondary | ICD-10-CM | POA: Diagnosis not present

## 2018-07-21 DIAGNOSIS — M7582 Other shoulder lesions, left shoulder: Secondary | ICD-10-CM | POA: Diagnosis not present

## 2018-07-21 DIAGNOSIS — M75101 Unspecified rotator cuff tear or rupture of right shoulder, not specified as traumatic: Secondary | ICD-10-CM | POA: Diagnosis not present

## 2018-07-22 DIAGNOSIS — M6281 Muscle weakness (generalized): Secondary | ICD-10-CM | POA: Diagnosis not present

## 2018-07-22 DIAGNOSIS — M2560 Stiffness of unspecified joint, not elsewhere classified: Secondary | ICD-10-CM | POA: Diagnosis not present

## 2018-07-22 DIAGNOSIS — M25511 Pain in right shoulder: Secondary | ICD-10-CM | POA: Diagnosis not present

## 2018-07-24 ENCOUNTER — Ambulatory Visit: Payer: Medicare Other | Admitting: Sports Medicine

## 2018-07-25 DIAGNOSIS — M25511 Pain in right shoulder: Secondary | ICD-10-CM | POA: Diagnosis not present

## 2018-07-25 DIAGNOSIS — M2560 Stiffness of unspecified joint, not elsewhere classified: Secondary | ICD-10-CM | POA: Diagnosis not present

## 2018-07-25 DIAGNOSIS — M6281 Muscle weakness (generalized): Secondary | ICD-10-CM | POA: Diagnosis not present

## 2018-07-29 DIAGNOSIS — M2560 Stiffness of unspecified joint, not elsewhere classified: Secondary | ICD-10-CM | POA: Diagnosis not present

## 2018-07-29 DIAGNOSIS — M25511 Pain in right shoulder: Secondary | ICD-10-CM | POA: Diagnosis not present

## 2018-07-29 DIAGNOSIS — M6281 Muscle weakness (generalized): Secondary | ICD-10-CM | POA: Diagnosis not present

## 2018-08-01 ENCOUNTER — Ambulatory Visit (INDEPENDENT_AMBULATORY_CARE_PROVIDER_SITE_OTHER): Payer: Medicare Other | Admitting: Sports Medicine

## 2018-08-01 ENCOUNTER — Encounter: Payer: Self-pay | Admitting: Sports Medicine

## 2018-08-01 VITALS — BP 131/81 | HR 74 | Resp 16

## 2018-08-01 DIAGNOSIS — D689 Coagulation defect, unspecified: Secondary | ICD-10-CM

## 2018-08-01 DIAGNOSIS — M79676 Pain in unspecified toe(s): Secondary | ICD-10-CM

## 2018-08-01 DIAGNOSIS — L608 Other nail disorders: Secondary | ICD-10-CM

## 2018-08-01 DIAGNOSIS — M6281 Muscle weakness (generalized): Secondary | ICD-10-CM | POA: Diagnosis not present

## 2018-08-01 DIAGNOSIS — B351 Tinea unguium: Secondary | ICD-10-CM

## 2018-08-01 DIAGNOSIS — M2560 Stiffness of unspecified joint, not elsewhere classified: Secondary | ICD-10-CM | POA: Diagnosis not present

## 2018-08-01 DIAGNOSIS — M25511 Pain in right shoulder: Secondary | ICD-10-CM | POA: Diagnosis not present

## 2018-08-01 NOTE — Progress Notes (Signed)
Subjective: Judith Solis is a 73 y.o. female patient seen today in office with complaint of mildly painful thickened and elongated toenails; unable to trim. Patient denies any changes with meds or medical history since last visit besides having aortic valve replacement and being placed on Plavix like previous.  Patient has no other pedal complaints at this time.   Patient Active Problem List   Diagnosis Date Noted  . 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 DDD (degenerative disc disease), cervical 09/12/2017  . Cervical spondylosis without myelopathy 08/29/2017  . Hyperlipidemia 04/05/2017  . Essential hypertension 04/05/2017  . PVCs (premature ventricular contractions) 10/21/2016    Current Outpatient Medications on File Prior to Visit  Medication Sig Dispense Refill  . aspirin (BAYER ASPIRIN) 325 MG tablet Take 1 tablet (325 mg total) by mouth daily. 100 tablet 3  . atorvastatin (LIPITOR) 10 MG tablet Take 10 mg by mouth at bedtime.     . clobetasol cream (TEMOVATE) 1.15 % Apply 1 application topically 2 (two) times daily as needed (vaginal irritation).    Marland Kitchen losartan-hydrochlorothiazide (HYZAAR) 50-12.5 MG tablet TAKE 1 TABLET EVERY DAY 90 tablet 1  . magnesium gluconate (MAGONATE) 500 MG tablet Take 500 mg by mouth at bedtime.    . pantoprazole (PROTONIX) 40 MG tablet Take 40 mg by mouth daily.    . pramipexole (MIRAPEX) 0.125 MG tablet Take 0.125 mg by mouth every night at bedtime    . traZODone (DESYREL) 50 MG tablet Take 50 mg by mouth at bedtime.     . VENTOLIN HFA 108 (90 Base) MCG/ACT inhaler Inhale 1 puff into the lungs every 6 (six) hours as needed for wheezing or shortness of breath.      No current facility-administered medications on file prior to visit.     Allergies  Allergen Reactions  . Plavix [Clopidogrel Bisulfate] Itching  . Codeine Nausea And Vomiting  . Oxycodone-Acetaminophen Other (See Comments)    Hallucinations     Objective: Physical Exam  General: Well developed, nourished, no acute distress, awake, alert and oriented x 3  Vascular: Dorsalis pedis artery 1/4 bilateral, Posterior tibial artery 0/4 bilateral today, skin temperature warm to warm proximal to distal bilateral lower extremities, moderate varicosities, no pedal hair present bilateral.  Neurological: Gross sensation present via light touch bilateral.   Dermatological: Skin is warm, dry, and supple bilateral, Nails 1-10 are tender, long with the most length on bilateral first and fourth toenails, thick, and discolored with mild subungal debris, no webspace macerations present bilateral, no open lesions present bilateral, no callus/corns/hyperkeratotic tissue present bilateral. No signs of infection bilateral.  Musculoskeletal: Asymptomatic bunion boney deformities noted bilateral. Muscular strength within normal limits without painon range of motion. No pain with calf compression bilateral.  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  -Discussed treatment options for painful mycotic nails. -ABN signed -Mechanically debrided and reduced mycotic nails with sterile nail nipper and dremel nail file without incident. -Patient to return in 3 months for follow up evaluation or sooner if symptoms worsen.  [name], DPM

## 2018-08-05 DIAGNOSIS — M2560 Stiffness of unspecified joint, not elsewhere classified: Secondary | ICD-10-CM | POA: Diagnosis not present

## 2018-08-05 DIAGNOSIS — M6281 Muscle weakness (generalized): Secondary | ICD-10-CM | POA: Diagnosis not present

## 2018-08-05 DIAGNOSIS — M25511 Pain in right shoulder: Secondary | ICD-10-CM | POA: Diagnosis not present

## 2018-08-08 DIAGNOSIS — M25511 Pain in right shoulder: Secondary | ICD-10-CM | POA: Diagnosis not present

## 2018-08-08 DIAGNOSIS — M2560 Stiffness of unspecified joint, not elsewhere classified: Secondary | ICD-10-CM | POA: Diagnosis not present

## 2018-08-08 DIAGNOSIS — M6281 Muscle weakness (generalized): Secondary | ICD-10-CM | POA: Diagnosis not present

## 2018-08-12 DIAGNOSIS — M2560 Stiffness of unspecified joint, not elsewhere classified: Secondary | ICD-10-CM | POA: Diagnosis not present

## 2018-08-12 DIAGNOSIS — M6281 Muscle weakness (generalized): Secondary | ICD-10-CM | POA: Diagnosis not present

## 2018-08-12 DIAGNOSIS — M25511 Pain in right shoulder: Secondary | ICD-10-CM | POA: Diagnosis not present

## 2018-08-15 DIAGNOSIS — M6281 Muscle weakness (generalized): Secondary | ICD-10-CM | POA: Diagnosis not present

## 2018-08-15 DIAGNOSIS — M25511 Pain in right shoulder: Secondary | ICD-10-CM | POA: Diagnosis not present

## 2018-08-15 DIAGNOSIS — M2560 Stiffness of unspecified joint, not elsewhere classified: Secondary | ICD-10-CM | POA: Diagnosis not present

## 2018-08-19 DIAGNOSIS — M2560 Stiffness of unspecified joint, not elsewhere classified: Secondary | ICD-10-CM | POA: Diagnosis not present

## 2018-08-19 DIAGNOSIS — M6281 Muscle weakness (generalized): Secondary | ICD-10-CM | POA: Diagnosis not present

## 2018-08-19 DIAGNOSIS — M25511 Pain in right shoulder: Secondary | ICD-10-CM | POA: Diagnosis not present

## 2018-08-22 DIAGNOSIS — M6281 Muscle weakness (generalized): Secondary | ICD-10-CM | POA: Diagnosis not present

## 2018-08-22 DIAGNOSIS — M25511 Pain in right shoulder: Secondary | ICD-10-CM | POA: Diagnosis not present

## 2018-08-22 DIAGNOSIS — M2560 Stiffness of unspecified joint, not elsewhere classified: Secondary | ICD-10-CM | POA: Diagnosis not present

## 2018-08-26 DIAGNOSIS — M2560 Stiffness of unspecified joint, not elsewhere classified: Secondary | ICD-10-CM | POA: Diagnosis not present

## 2018-08-26 DIAGNOSIS — M6281 Muscle weakness (generalized): Secondary | ICD-10-CM | POA: Diagnosis not present

## 2018-08-26 DIAGNOSIS — M25511 Pain in right shoulder: Secondary | ICD-10-CM | POA: Diagnosis not present

## 2018-08-29 DIAGNOSIS — M25511 Pain in right shoulder: Secondary | ICD-10-CM | POA: Diagnosis not present

## 2018-08-29 DIAGNOSIS — M2560 Stiffness of unspecified joint, not elsewhere classified: Secondary | ICD-10-CM | POA: Diagnosis not present

## 2018-08-29 DIAGNOSIS — M6281 Muscle weakness (generalized): Secondary | ICD-10-CM | POA: Diagnosis not present

## 2018-09-01 DIAGNOSIS — M25512 Pain in left shoulder: Secondary | ICD-10-CM | POA: Diagnosis not present

## 2018-09-01 DIAGNOSIS — M7582 Other shoulder lesions, left shoulder: Secondary | ICD-10-CM | POA: Diagnosis not present

## 2018-09-01 DIAGNOSIS — M19012 Primary osteoarthritis, left shoulder: Secondary | ICD-10-CM | POA: Diagnosis not present

## 2018-09-05 DIAGNOSIS — Z1231 Encounter for screening mammogram for malignant neoplasm of breast: Secondary | ICD-10-CM | POA: Diagnosis not present

## 2018-09-09 DIAGNOSIS — Z853 Personal history of malignant neoplasm of breast: Secondary | ICD-10-CM | POA: Diagnosis not present

## 2018-10-30 ENCOUNTER — Ambulatory Visit: Payer: Medicare Other | Admitting: Sports Medicine

## 2018-11-12 DIAGNOSIS — K6289 Other specified diseases of anus and rectum: Secondary | ICD-10-CM | POA: Diagnosis not present

## 2018-11-12 DIAGNOSIS — R300 Dysuria: Secondary | ICD-10-CM | POA: Diagnosis not present

## 2018-11-12 DIAGNOSIS — Z6833 Body mass index (BMI) 33.0-33.9, adult: Secondary | ICD-10-CM | POA: Diagnosis not present

## 2018-11-26 DIAGNOSIS — Z6833 Body mass index (BMI) 33.0-33.9, adult: Secondary | ICD-10-CM | POA: Diagnosis not present

## 2018-11-26 DIAGNOSIS — G479 Sleep disorder, unspecified: Secondary | ICD-10-CM | POA: Diagnosis not present

## 2018-12-03 ENCOUNTER — Telehealth: Payer: Self-pay | Admitting: *Deleted

## 2018-12-03 MED ORDER — LOSARTAN POTASSIUM-HCTZ 50-12.5 MG PO TABS: 1.0000 | ORAL_TABLET | Freq: Every day | ORAL | 1 refills | Status: DC

## 2018-12-03 NOTE — Telephone Encounter (Signed)
Rx refill sent to pharmacy.  *STAT* If patient is at the pharmacy, call can be transferred to refill team.   1. Which medications need to be refilled? (please list name of each medication and dose if known) Losartan HcTZ 50-12.5 mg bid  2. Which pharmacy/location (including street and city if local pharmacy) is medication to be sent to?[facility]  3. Do they need a 30 day or 90 day supply? [place]

## 2018-12-18 DIAGNOSIS — L814 Other melanin hyperpigmentation: Secondary | ICD-10-CM | POA: Diagnosis not present

## 2018-12-18 DIAGNOSIS — L820 Inflamed seborrheic keratosis: Secondary | ICD-10-CM | POA: Diagnosis not present

## 2018-12-18 DIAGNOSIS — D225 Melanocytic nevi of trunk: Secondary | ICD-10-CM | POA: Diagnosis not present

## 2018-12-18 DIAGNOSIS — D1801 Hemangioma of skin and subcutaneous tissue: Secondary | ICD-10-CM | POA: Diagnosis not present

## 2018-12-18 DIAGNOSIS — C44519 Basal cell carcinoma of skin of other part of trunk: Secondary | ICD-10-CM | POA: Diagnosis not present

## 2018-12-18 DIAGNOSIS — C44729 Squamous cell carcinoma of skin of left lower limb, including hip: Secondary | ICD-10-CM | POA: Diagnosis not present

## 2018-12-18 DIAGNOSIS — D2239 Melanocytic nevi of other parts of face: Secondary | ICD-10-CM | POA: Diagnosis not present

## 2018-12-22 DIAGNOSIS — H1859 Other hereditary corneal dystrophies: Secondary | ICD-10-CM | POA: Diagnosis not present

## 2018-12-27 DIAGNOSIS — G4733 Obstructive sleep apnea (adult) (pediatric): Secondary | ICD-10-CM | POA: Diagnosis not present

## 2019-01-09 DIAGNOSIS — H1859 Other hereditary corneal dystrophies: Secondary | ICD-10-CM | POA: Diagnosis not present

## 2019-01-14 DIAGNOSIS — H02403 Unspecified ptosis of bilateral eyelids: Secondary | ICD-10-CM | POA: Diagnosis not present

## 2019-01-14 DIAGNOSIS — Z01818 Encounter for other preprocedural examination: Secondary | ICD-10-CM | POA: Diagnosis not present

## 2019-01-15 ENCOUNTER — Telehealth: Payer: Self-pay | Admitting: Cardiology

## 2019-01-15 NOTE — Telephone Encounter (Signed)
Attempted to call Judith Solis back at [facility]. Left message on voicemail to return call.

## 2019-01-15 NOTE — Telephone Encounter (Signed)
Patient having eye surgery 01/20/2019 Tuesday. Need to know if she can stop Aspirin 325 mg for the surgery.

## 2019-01-15 NOTE — Telephone Encounter (Signed)
Yes they can stop aspirin but they must give her instructions when to stop it and when to resume it usually 1 to 2 days after surgery.  It is uncommon for routine eye surgery to stop aspirin please asked them what procedure she is having done

## 2019-01-15 NOTE — Telephone Encounter (Signed)
Anderson Malta from [facility] needs a call back regarding stopping medication for this patient. Please call [phone].

## 2019-01-16 NOTE — Telephone Encounter (Signed)
Anderson Malta, [place] returned call stating that patient has droopiness of both eye lids so they are going to go in and tighten her muscles to help improve the droopiness. This procedure is abbreviated as LAA. Informed Jennifer, PA that aspirin can be stopped for this procedure per Dr. Bettina Gavia. No further questions.

## 2019-01-16 NOTE — Telephone Encounter (Signed)
Att[place] call to [place] at [facility].Left message to call back.

## 2019-01-20 DIAGNOSIS — H02403 Unspecified ptosis of bilateral eyelids: Secondary | ICD-10-CM | POA: Diagnosis not present

## 2019-01-20 DIAGNOSIS — H53453 Other localized visual field defect, bilateral: Secondary | ICD-10-CM | POA: Diagnosis not present

## 2019-01-20 DIAGNOSIS — H02423 Myogenic ptosis of bilateral eyelids: Secondary | ICD-10-CM | POA: Diagnosis not present

## 2019-02-09 DIAGNOSIS — R7309 Other abnormal glucose: Secondary | ICD-10-CM | POA: Diagnosis not present

## 2019-02-09 DIAGNOSIS — E785 Hyperlipidemia, unspecified: Secondary | ICD-10-CM | POA: Diagnosis not present

## 2019-02-09 DIAGNOSIS — Z6833 Body mass index (BMI) 33.0-33.9, adult: Secondary | ICD-10-CM | POA: Diagnosis not present

## 2019-02-09 DIAGNOSIS — Z79899 Other long term (current) drug therapy: Secondary | ICD-10-CM | POA: Diagnosis not present

## 2019-02-09 DIAGNOSIS — R5381 Other malaise: Secondary | ICD-10-CM | POA: Diagnosis not present

## 2019-02-09 DIAGNOSIS — R300 Dysuria: Secondary | ICD-10-CM | POA: Diagnosis not present

## 2019-02-26 DIAGNOSIS — N3000 Acute cystitis without hematuria: Secondary | ICD-10-CM | POA: Diagnosis not present

## 2019-02-26 DIAGNOSIS — Z6833 Body mass index (BMI) 33.0-33.9, adult: Secondary | ICD-10-CM | POA: Diagnosis not present

## 2019-03-09 ENCOUNTER — Other Ambulatory Visit: Payer: Self-pay

## 2019-03-09 ENCOUNTER — Ambulatory Visit (INDEPENDENT_AMBULATORY_CARE_PROVIDER_SITE_OTHER): Payer: Medicare Other

## 2019-03-09 DIAGNOSIS — Z952 Presence of prosthetic heart valve: Secondary | ICD-10-CM | POA: Diagnosis not present

## 2019-03-09 NOTE — Progress Notes (Signed)
Complete echocardiogram has been performed.  Jimmy Lun Muro RDCS, RVT 

## 2019-03-10 ENCOUNTER — Other Ambulatory Visit: Payer: Medicare Other

## 2019-03-10 ENCOUNTER — Other Ambulatory Visit: Payer: Self-pay | Admitting: Physician Assistant

## 2019-03-10 DIAGNOSIS — Z952 Presence of prosthetic heart valve: Secondary | ICD-10-CM

## 2019-03-13 ENCOUNTER — Encounter: Payer: Self-pay | Admitting: Cardiology

## 2019-03-13 ENCOUNTER — Ambulatory Visit (INDEPENDENT_AMBULATORY_CARE_PROVIDER_SITE_OTHER): Payer: Medicare Other | Admitting: Cardiology

## 2019-03-13 ENCOUNTER — Other Ambulatory Visit: Payer: Self-pay

## 2019-03-13 VITALS — BP 106/64 | HR 73 | Ht 65.0 in | Wt 206.0 lb

## 2019-03-13 DIAGNOSIS — I10 Essential (primary) hypertension: Secondary | ICD-10-CM

## 2019-03-13 DIAGNOSIS — Z952 Presence of prosthetic heart valve: Secondary | ICD-10-CM | POA: Diagnosis not present

## 2019-03-13 DIAGNOSIS — Z7901 Long term (current) use of anticoagulants: Secondary | ICD-10-CM | POA: Diagnosis not present

## 2019-03-13 DIAGNOSIS — R0602 Shortness of breath: Secondary | ICD-10-CM | POA: Diagnosis not present

## 2019-03-13 DIAGNOSIS — E785 Hyperlipidemia, unspecified: Secondary | ICD-10-CM

## 2019-03-13 NOTE — Progress Notes (Addendum)
Cardiology Office Note:    Date:  03/13/2019   ID:  Judith Solis, [place] 1946/05/12, MRN [id number]  PCP:  Ronita Hipps, MD  Cardiologist:  Shirlee More, MD    Referring MD: Ronita Hipps, MD    ASSESSMENT:    1. S/P TAVR (transcatheter aortic valve replacement)   2. Essential hypertension   3. Hyperlipidemia, unspecified hyperlipidemia type    PLAN:    In order of problems listed above:  Stable however she had a subtle increase in valvular pressure gradient will have a recheck in 6 months.  At this time I would not commit her to anticoagulation.  She finished her quality of life questionnaire she is New York Heart Association class II Hypertension is worsened systolic blood pressure less than 110 complains of orthostatic lightheadedness as well as difficulty walking in the garden bending over with weakness and shortness of breath related to relative hypotension will discontinue her ARB thiazide diuretic.  Also check a proBNP level.  She has no signs or symptoms of heart failure also check a CBC Hyperlipidemia stable recent lipid profile performed in the PCP office shows a cholesterol 188 LDL 117  Next appointment: 6 weeks   Medication Adjustments/Labs and Tests Ordered: Current medicines are reviewed at length with the patient today.  Concerns regarding medicines are outlined above.  No orders of the defined types were placed in this encounter.  No orders of the defined types were placed in this encounter.   Chief Complaint  Patient presents with  . Follow-up    after TAVR    History of Present Illness:    Judith Solis is a 73 y.o. female with a hx of hypertension, hyperlipidemia and aortic stenosis with  TAVR 03/04/18  last seen 07/08/2018.  Compliance with diet, lifestyle and medications: Yes  She is a little frustrated she expects to feel better her predominant complaint is being lightheaded when she shifts posture and difficulty working in the garden hot humid  weather bending over with breathlessness and fatigue.  New York Heart Association class II she has no edema asked me if she has congestive heart failure I told her I do not think so we will check an N-terminal proBNP level to help with the decision making she has no edema rales neck vein distention or S3 and has no orthopnea or edema.  Echo 03/09/2019:  1. The left ventricle has normal systolic function with an ejection fraction of 60-65%. The cavity size was normal. There is moderate concentric left ventricular hypertrophy. Left ventricular diastolic Doppler parameters are consistent with impaired  relaxation. Elevated mean left atrial pressure No evidence of left ventricular regional wall motion abnormalities.  2. The right ventricle has normal systolic function. The cavity was normal. There is no increase in right ventricular wall thickness.  3. The mitral valve is degenerative. Mild thickening of the mitral valve leaflet. There is mild mitral annular calcification present. No evidence of mitral valve stenosis.  4. The aortic root and ascending aorta are normal in size and structure.  5. Sp TAVR, the peak and mean gradients have increased from 20/10 mm Hg in September 2019  Past Medical History:  Diagnosis Date  . Aortic valve regurgitation   . Arthritis   . Asthma   . Breast cancer ([place])    a. s/p chemo and radiation  . GERD (gastroesophageal reflux disease)   . Hyperlipidemia   . Hypertension   . Morbid obesity ([place])   . S/P TAVR (  transcatheter aortic valve replacement) 03/04/2018   26 mm Edwards Sapien 3 transcatheter heart valve placed via percutaneous right transfemoral approach   . Severe aortic stenosis     Past Surgical History:  Procedure Laterality Date  . ABDOMINAL HYSTERECTOMY    . BELPHAROPTOSIS REPAIR Bilateral   . BREAST SURGERY     LUMPECTOMY WITH CHEMO & RADIATION  . CATARACT EXTRACTION    . EYE SURGERY    . INTRAOPERATIVE TRANSTHORACIC ECHOCARDIOGRAM  03/04/2018    Procedure: INTRAOPERATIVE TRANSTHORACIC ECHOCARDIOGRAM;  Surgeon: Sherren Mocha, MD;  Location: [facility] OR;  Service: Open Heart Surgery;;  . JOINT REPLACEMENT     BILATERAL HIPS 2007 & 2008  . RIGHT/LEFT HEART CATH AND CORONARY ANGIOGRAPHY N/A 01/31/2018   Procedure: RIGHT/LEFT HEART CATH AND CORONARY ANGIOGRAPHY;  Surgeon: Burnell Blanks, MD;  Location: [place] CV LAB;  Service: Cardiovascular;  Laterality: N/A;  . ROTATOR CUFF REPAIR    . TONSILLECTOMY    . TRANSCATHETER AORTIC VALVE REPLACEMENT, TRANSFEMORAL N/A 03/04/2018   Procedure: TRANSCATHETER AORTIC VALVE REPLACEMENT, TRANSFEMORAL. EDWARDS 26MM SAPIEN 3 TRANSCATHETER HEART VALVE.;  Surgeon: Sherren Mocha, MD;  Location: [employer];  Service: Open Heart Surgery;  Laterality: N/A;    Current Medications: Current Meds  Medication Sig  . amoxicillin (AMOXIL) 500 MG capsule TAKE FOUR CAPSULES BY MOUTH ONE HOUR BEFORE DENTAL APPOINTMENT  . aspirin (BAYER ASPIRIN) 325 MG tablet Take 1 tablet (325 mg total) by mouth daily.  Marland Kitchen atorvastatin (LIPITOR) 10 MG tablet Take 10 mg by mouth at bedtime.   . citalopram (CELEXA) 10 MG tablet Take 10 mg by mouth daily.  . clobetasol cream (TEMOVATE) [id number] % Apply 1 application topically 2 (two) times daily as needed (vaginal irritation).  Marland Kitchen losartan-hydrochlorothiazide (HYZAAR) 50-12.5 MG tablet Take 1 tablet by mouth daily.  . magnesium gluconate (MAGONATE) 500 MG tablet Take 500 mg by mouth at bedtime.  . pantoprazole (PROTONIX) 40 MG tablet Take 40 mg by mouth daily.  . pramipexole (MIRAPEX) 0.125 MG tablet Take 0.125 mg by mouth every night at bedtime  . traZODone (DESYREL) 100 MG tablet Take 100 mg by mouth at bedtime.   . VENTOLIN HFA 108 (90 Base) MCG/ACT inhaler Inhale 1 puff into the lungs every 6 (six) hours as needed for wheezing or shortness of breath.      Allergies:   Plavix [clopidogrel bisulfate], Codeine, and Oxycodone-acetaminophen   Social History   Socioeconomic History   . Marital status: Widowed    Spouse name: Not on file  . Number of children: Not on file  . Years of education: Not on file  . Highest education level: Not on file  Occupational History  . Not on file  Social Needs  . Financial resource strain: Not on file  . Food insecurity    Worry: Not on file    Inability: Not on file  . Transportation needs    Medical: Not on file    Non-medical: Not on file  Tobacco Use  . Smoking status: Former Smoker    Packs/day: 1.50    Years: 20.00    Pack years: 30.00    Types: Cigarettes    Quit date: 06/04/1990    Years since quitting: 28.7  . Smokeless tobacco: Never Used  Substance and Sexual Activity  . Alcohol use: No  . Drug use: No  . Sexual activity: Not on file  Lifestyle  . Physical activity    Days per week: Not on file  Minutes per session: Not on file  . Stress: Not on file  Relationships  . Social Herbalist on phone: Not on file    Gets together: Not on file    Attends religious service: Not on file    Active member of club or organization: Not on file    Attends meetings of clubs or organizations: Not on file    Relationship status: Not on file  Other Topics Concern  . Not on file  Social History Narrative  . Not on file     Family History: The patient's family history includes Diabetes Mellitus II in her paternal grandfather, sister, and sister; Heart Problems in her brother and sister; Heart disease in her brother, maternal grandfather, and mother; Hepatitis C in her sister; Lung cancer in her sister; Stroke in her father; Throat cancer in her sister. ROS:   Please see the history of present illness.    All other systems reviewed and are negative.  EKGs/Labs/Other Studies Reviewed:    The following studies were reviewed today  Recent Labs: 02/09/2019 A1c 6.5 creatinine 1.0 TSH normal.  Physical Exam:    VS:  BP 106/64 (BP Location: Right Arm, Patient Position: Sitting, Cuff Size: Normal)    Pulse 73   Ht 5\' 5"  (1.651 m)   Wt 206 lb (93.4 kg)   SpO2 95%   BMI 34.28 kg/m     Wt Readings from Last 3 Encounters:  03/13/19 206 lb (93.4 kg)  07/08/18 200 lb (90.7 kg)  03/12/18 207 lb 12.8 oz (94.3 kg)     GEN:  Well nourished, well developed in no acute distress HEENT: Normal NECK: No JVD; No carotid bruits LYMPHATICS: No lymphadenopathy CARDIAC: Grade 1/6 to 2/6 localized aortic area midsystolic murmur does not encompass S2 RRR, no murmurs, rubs, gallops RESPIRATORY:  Clear to auscultation without rales, wheezing or rhonchi  ABDOMEN: Soft, non-tender, non-distended MUSCULOSKELETAL:  No edema; No deformity  SKIN: Warm and dry NEUROLOGIC:  Alert and oriented x 3 PSYCHIATRIC:  Normal affect    Signed, Shirlee More, MD  03/13/2019 1:38 PM    [facility]    ADDENDUM: KCCQ copied below. [facility] Cardiomyopathy Questionnaire  KCCQ-12 03/16/2019  1 a. Ability to shower/bathe Not at all limited  1 b. Ability to walk 1 block Slightly limited  1 c. Ability to hurry/jog Moderately limited  2. Edema feet/ankles/legs Never over the past 2 weeks  3. Limited by fatigue 1-2 times a week  4. Limited by dyspnea 1-2 times a week  5. Sitting up / on 3+ pillows Never over the past 2 weeks  6. Limited enjoyment of life Slightly limited  7. Rest of life w/ symptoms Somewhat satisfied  8 a. Participation in hobbies N/A, did not do for other reasons  8 b. Participation in chores Did not limit at all  8 c. Visiting family/friends Did not limit at all    Angelena Form PA-C  [facility]

## 2019-03-13 NOTE — Patient Instructions (Signed)
Medication Instructions:  Your physician has recommended you make the following change in your medication:   STOP losartan-hydrochlorothiazide  If you need a refill on your cardiac medications before your next appointment, please call your pharmacy.   Lab work: Your physician recommends that you return for lab work today: CBC, [place].   If you have labs (blood work) drawn today and your tests are completely normal, you will receive your results only by: Marland Kitchen MyChart Message (if you have MyChart) OR . A paper copy in the mail If you have any lab test that is abnormal or we need to change your treatment, we will call you to review the results.  Testing/Procedures: None  Follow-Up: At [facility], you and your health needs are our priority.  As part of our continuing mission to provide you with exceptional heart care, we have created designated Provider Care Teams.  These Care Teams include your primary Cardiologist (physician) and Advanced Practice Providers (APPs -  Physician Assistants and Nurse Practitioners) who all work together to provide you with the care you need, when you need it. You will need a follow up appointment in 6 weeks.

## 2019-03-14 LAB — CBC
Hematocrit: 37.3 % (ref 34.0–46.6)
Hemoglobin: 12.9 g/dL (ref 11.1–15.9)
MCH: 30.6 pg (ref 26.6–33.0)
MCHC: 34.6 g/dL (ref 31.5–35.7)
MCV: 89 fL (ref 79–97)
Platelets: 215 10*3/uL (ref 150–450)
RBC: 4.21 x10E6/uL (ref 3.77–5.28)
RDW: 13.3 % (ref 11.7–15.4)
WBC: 6 10*3/uL (ref 3.4–10.8)

## 2019-03-14 LAB — PRO B NATRIURETIC PEPTIDE: NT-Pro BNP: 102 pg/mL (ref 0–738)

## 2019-03-16 ENCOUNTER — Telehealth: Payer: Self-pay

## 2019-03-16 NOTE — Telephone Encounter (Signed)
Results relayed, no further questions. 

## 2019-03-16 NOTE — Telephone Encounter (Signed)
-----   Message from Richardo Priest, MD sent at 03/14/2019  7:45 AM EDT ----- Good result no changes in treatment

## 2019-04-04 DIAGNOSIS — Z23 Encounter for immunization: Secondary | ICD-10-CM | POA: Diagnosis not present

## 2019-05-14 DIAGNOSIS — Z1331 Encounter for screening for depression: Secondary | ICD-10-CM | POA: Diagnosis not present

## 2019-05-14 DIAGNOSIS — G4730 Sleep apnea, unspecified: Secondary | ICD-10-CM | POA: Diagnosis not present

## 2019-05-14 DIAGNOSIS — Z0000 Encounter for general adult medical examination without abnormal findings: Secondary | ICD-10-CM | POA: Diagnosis not present

## 2019-05-14 DIAGNOSIS — Z6833 Body mass index (BMI) 33.0-33.9, adult: Secondary | ICD-10-CM | POA: Diagnosis not present

## 2019-05-22 ENCOUNTER — Other Ambulatory Visit: Payer: Self-pay

## 2019-05-22 ENCOUNTER — Ambulatory Visit (INDEPENDENT_AMBULATORY_CARE_PROVIDER_SITE_OTHER): Payer: Medicare Other | Admitting: Cardiology

## 2019-05-22 ENCOUNTER — Encounter: Payer: Self-pay | Admitting: Cardiology

## 2019-05-22 VITALS — BP 120/74 | HR 78 | Ht 65.5 in | Wt 204.4 lb

## 2019-05-22 DIAGNOSIS — R0609 Other forms of dyspnea: Secondary | ICD-10-CM

## 2019-05-22 DIAGNOSIS — Z952 Presence of prosthetic heart valve: Secondary | ICD-10-CM | POA: Diagnosis not present

## 2019-05-22 DIAGNOSIS — I350 Nonrheumatic aortic (valve) stenosis: Secondary | ICD-10-CM | POA: Diagnosis not present

## 2019-05-22 DIAGNOSIS — R0600 Dyspnea, unspecified: Secondary | ICD-10-CM | POA: Diagnosis not present

## 2019-05-22 DIAGNOSIS — I493 Ventricular premature depolarization: Secondary | ICD-10-CM | POA: Diagnosis not present

## 2019-05-22 DIAGNOSIS — E7800 Pure hypercholesterolemia, unspecified: Secondary | ICD-10-CM

## 2019-05-22 DIAGNOSIS — I10 Essential (primary) hypertension: Secondary | ICD-10-CM | POA: Diagnosis not present

## 2019-05-22 NOTE — Patient Instructions (Signed)
Medication Instructions:  Your physician recommends that you continue on your current medications as directed. Please refer to the Current Medication list given to you today.  *If you need a refill on your cardiac medications before your next appointment, please call your pharmacy*  Lab Work: Your physician recommends that you return for lab work today: CMP, ProBNP, Lipid panel.   If you have labs (blood work) drawn today and your tests are completely normal, you will receive your results only by: Marland Kitchen MyChart Message (if you have MyChart) OR . A paper copy in the mail If you have any lab test that is abnormal or we need to change your treatment, we will call you to review the results.  Testing/Procedures: None  Follow-Up: At [facility], you and your health needs are our priority.  As part of our continuing mission to provide you with exceptional heart care, we have created designated Provider Care Teams.  These Care Teams include your primary Cardiologist (physician) and Advanced Practice Providers (APPs -  Physician Assistants and Nurse Practitioners) who all work together to provide you with the care you need, when you need it.  Your next appointment:   6 months  The format for your next appointment:   In Person  Provider:   Shirlee More, MD

## 2019-05-22 NOTE — Progress Notes (Signed)
Cardiology Office Note:    Date:  05/22/2019   ID:  Judith Solis, [place] 1946-02-23, MRN [id number]  PCP:  Ronita Hipps, MD  Cardiologist:  Shirlee More, MD    Referring MD: Ronita Hipps, MD    ASSESSMENT:    1. S/P TAVR (transcatheter aortic valve replacement)   2. Pure hypercholesterolemia    PLAN:    In order of problems listed above:  1. She has had an exceptional response to TAVR New York Heart Association class I recheck echocardiogram in February regarding gradients continue aspirin at this time.  Strongly encouraged her to continue wellness cardiac rehabilitation master program 2. Stable continue statin check lipid profile CMP and also proBNP as a marker of response to aortic valve intervention   Next appointment: 6 months   Medication Adjustments/Labs and Tests Ordered: Current medicines are reviewed at length with the patient today.  Concerns regarding medicines are outlined above.  No orders of the defined types were placed in this encounter.  No orders of the defined types were placed in this encounter.   Chief Complaint  Patient presents with  . Follow-up    after TAVR    History of Present Illness:    Judith Solis is a 73 y.o. female with a hx of hypertension, hyperlipidemia and aortic stenosis with  TAVR 03/04/18   last seen 03/13/2019.  Her N-terminal proBNP level from that visit was normal at 102.  Hemoglobin was normal 12.9. Compliance with diet, lifestyle and medications: She continues to do well yes she is compliant  I reviewed her echo below with her.  Echo 03/09/2019: 1. The left ventricle has normal systolic function with an ejection fraction of 60-65%. The cavity size was normal. There is moderate concentric left ventricular hypertrophy. Left ventricular diastolic Doppler parameters are consistent with impaired  relaxation. Elevated mean left atrial pressure No evidence of left ventricular regional wall motion abnormalities. 2. The  right ventricle has normal systolic function. The cavity was normal. There is no increase in right ventricular wall thickness. 3. The mitral valve is degenerative. Mild thickening of the mitral valve leaflet. There is mild mitral annular calcification present. No evidence of mitral valve stenosis. 4. The aortic root and ascending aorta are normal in size and structure. 5. Sp TAVR, the peak and mean gradients have increased to 34/18 from 20/10 mm Hg in September 2019  Overall she is done well she feels better than normal and is involved in the wellness cardiac rehab Masters program has no exercise intolerance shortness of breath edema palpitation or syncope.  She practices all the precautions of COVID-19.  We will plan to recheck her echocardiogram in February she had subtle changes in gradients but not diagnostic of TAVR dysfunction.  She understands that if she fulfills the diagnostic criteria she require anticoagulation with.  Warfarin.  We will recheck labs her last lipid profile was in December 2019 along with CMP and proBNP today.  Labs 07/10/2018 cholesterol 188 HDL 37 LDL 117. Past Medical History:  Diagnosis Date  . Aortic valve regurgitation   . Arthritis   . Asthma   . Breast cancer ([place])    a. s/p chemo and radiation  . GERD (gastroesophageal reflux disease)   . Hyperlipidemia   . Hypertension   . Morbid obesity ([place])   . 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 Severe aortic  stenosis     Past Surgical History:  Procedure Laterality Date  . ABDOMINAL HYSTERECTOMY    . BELPHAROPTOSIS REPAIR Bilateral   . BREAST SURGERY     LUMPECTOMY WITH CHEMO & RADIATION  . CATARACT EXTRACTION    . EYE SURGERY    . INTRAOPERATIVE TRANSTHORACIC ECHOCARDIOGRAM  03/04/2018   Procedure: INTRAOPERATIVE TRANSTHORACIC ECHOCARDIOGRAM;  Surgeon: Sherren Mocha, MD;   Location: [facility] OR;  Service: Open Heart Surgery;;  . JOINT REPLACEMENT     BILATERAL HIPS 2007 & 2008  . RIGHT/LEFT HEART CATH AND CORONARY ANGIOGRAPHY N/A 01/31/2018   Procedure: RIGHT/LEFT HEART CATH AND CORONARY ANGIOGRAPHY;  Surgeon: Burnell Blanks, MD;  Location: [facility];  Service: Cardiovascular;  Laterality: N/A;  . ROTATOR CUFF REPAIR    . TONSILLECTOMY    . TRANSCATHETER AORTIC VALVE REPLACEMENT, TRANSFEMORAL N/A 03/04/2018   Procedure: TRANSCATHETER AORTIC VALVE REPLACEMENT, TRANSFEMORAL. EDWARDS 26MM SAPIEN 3 TRANSCATHETER HEART VALVE.;  Surgeon: Sherren Mocha, MD;  Location: [facility];  Service: Open Heart Surgery;  Laterality: N/A;    Current Medications: Current Meds  Medication Sig  . amoxicillin (AMOXIL) 500 MG capsule TAKE FOUR CAPSULES BY MOUTH ONE HOUR BEFORE DENTAL APPOINTMENT  . atorvastatin (LIPITOR) 10 MG tablet Take 10 mg by mouth at bedtime.   . citalopram (CELEXA) 10 MG tablet Take 10 mg by mouth daily.  . clobetasol cream (TEMOVATE) [id number] % Apply 1 application topically 2 (two) times daily as needed (vaginal irritation).  . magnesium gluconate (MAGONATE) 500 MG tablet Take 500 mg by mouth at bedtime.  . Multiple Vitamin (MULTIVITAMIN) tablet Take 1 tablet by mouth daily.  . pantoprazole (PROTONIX) 40 MG tablet Take 40 mg by mouth daily.  . pramipexole (MIRAPEX) 0.125 MG tablet Take 0.125 mg by mouth every night at bedtime  . Probiotic Product (PROBIOTIC-10 PO) Take 1 tablet by mouth at bedtime.  . psyllium (REGULOID) 0.52 g capsule Take 0.52 g by mouth daily.  . traZODone (DESYREL) 100 MG tablet Take 100 mg by mouth at bedtime.   . VENTOLIN HFA 108 (90 Base) MCG/ACT inhaler Inhale 1 puff into the lungs every 6 (six) hours as needed for wheezing or shortness of breath.      Allergies:   Plavix [clopidogrel bisulfate], Codeine, and Oxycodone-acetaminophen   Social History   Socioeconomic History  . Marital status: Widowed    Spouse name: Not on  file  . Number of children: Not on file  . Years of education: Not on file  . Highest education level: Not on file  Occupational History  . Not on file  Social Needs  . Financial resource strain: Not on file  . Food insecurity    Worry: Not on file    Inability: Not on file  . Transportation needs    Medical: Not on file    Non-medical: Not on file  Tobacco Use  . Smoking status: Former Smoker    Packs/day: 1.50    Years: 20.00    Pack years: 30.00    Types: Cigarettes    Quit date: 06/04/1990    Years since quitting: 28.9  . Smokeless tobacco: Never Used  Substance and Sexual Activity  . Alcohol use: No  . Drug use: No  . Sexual activity: Not on file  Lifestyle  . Physical activity    Days per week: Not on file    Minutes per session: Not on file  . Stress: Not on file  Relationships  . Social connections  Talks on phone: Not on file    Gets together: Not on file    Attends religious service: Not on file    Active member of club or organization: Not on file    Attends meetings of clubs or organizations: Not on file    Relationship status: Not on file  Other Topics Concern  . Not on file  Social History Narrative  . Not on file     Family History: The patient's family history includes Diabetes Mellitus II in her paternal grandfather, sister, and sister; Heart Problems in her brother and sister; Heart disease in her brother, maternal grandfather, and mother; Hepatitis C in her sister; Lung cancer in her sister; Stroke in her father; Throat cancer in her sister. ROS:   Please see the history of present illness.    All other systems reviewed and are negative.  EKGs/Labs/Other Studies Reviewed:    The following studies were reviewed today:    Recent Labs: 03/13/2019: Hemoglobin 12.9; NT-Pro BNP 102; Platelets 215  Recent Lipid Panel No results found for: CHOL, TRIG, HDL, CHOLHDL, VLDL, LDLCALC, LDLDIRECT  Physical Exam:    VS:  BP 120/74 (BP Location:  Right Arm, Patient Position: Sitting, Cuff Size: Normal)   Pulse 78   Ht 5' 5.5" (1.664 m)   Wt 204 lb 6.4 oz (92.7 kg)   SpO2 96%   BMI 33.50 kg/m     Wt Readings from Last 3 Encounters:  05/22/19 204 lb 6.4 oz (92.7 kg)  03/13/19 206 lb (93.4 kg)  07/08/18 200 lb (90.7 kg)     GEN:  Well nourished, well developed in no acute distress HEENT: Normal NECK: No JVD; No carotid bruits LYMPHATICS: No lymphadenopathy CARDIAC: Minimal 1 of 6 localized aortic area midsystolic blowing murmur no aortic regurgitation does not impinge on S2 and S2 is normal RRR, no murmurs, rubs, gallops RESPIRATORY:  Clear to auscultation without rales, wheezing or rhonchi  ABDOMEN: Soft, non-tender, non-distended MUSCULOSKELETAL:  No edema; No deformity  SKIN: Warm and dry NEUROLOGIC:  Alert and oriented x 3 PSYCHIATRIC:  Normal affect    Signed, Shirlee More, MD  05/22/2019 2:26 PM    [facility]

## 2019-05-23 LAB — COMPREHENSIVE METABOLIC PANEL
ALT: 29 IU/L (ref 0–32)
AST: 24 IU/L (ref 0–40)
Albumin/Globulin Ratio: 1.7 (ref 1.2–2.2)
Albumin: 4.3 g/dL (ref 3.7–4.7)
Alkaline Phosphatase: 82 IU/L (ref 39–117)
BUN/Creatinine Ratio: 22 (ref 12–28)
BUN: 21 mg/dL (ref 8–27)
Bilirubin Total: 0.4 mg/dL (ref 0.0–1.2)
CO2: 26 mmol/L (ref 20–29)
Calcium: 9.2 mg/dL (ref 8.7–10.3)
Chloride: 103 mmol/L (ref 96–106)
Creatinine, Ser: 0.97 mg/dL (ref 0.57–1.00)
GFR calc Af Amer: 66 mL/min/{1.73_m2} (ref >59)
GFR calc non Af Amer: 57 mL/min/{1.73_m2} — ABNORMAL LOW (ref >59)
Globulin, Total: 2.5 g/dL (ref 1.5–4.5)
Glucose: 81 mg/dL (ref 65–99)
Potassium: 4.6 mmol/L (ref 3.5–5.2)
Sodium: 140 mmol/L (ref 134–144)
Total Protein: 6.8 g/dL (ref 6.0–8.5)

## 2019-05-23 LAB — PRO B NATRIURETIC PEPTIDE: NT-Pro BNP: 101 pg/mL (ref 0–738)

## 2019-05-23 LAB — LIPID PANEL
Chol/HDL Ratio: 5.2 ratio — ABNORMAL HIGH (ref 0.0–4.4)
Cholesterol, Total: 165 mg/dL (ref 100–199)
HDL: 32 mg/dL — ABNORMAL LOW (ref >39)
LDL Chol Calc (NIH): 95 mg/dL (ref 0–99)
Triglycerides: 221 mg/dL — ABNORMAL HIGH (ref 0–149)
VLDL Cholesterol Cal: 38 mg/dL (ref 5–40)

## 2019-05-29 ENCOUNTER — Other Ambulatory Visit: Payer: Self-pay | Admitting: Sports Medicine

## 2019-05-29 ENCOUNTER — Ambulatory Visit (INDEPENDENT_AMBULATORY_CARE_PROVIDER_SITE_OTHER): Payer: Medicare Other | Admitting: Sports Medicine

## 2019-05-29 ENCOUNTER — Other Ambulatory Visit: Payer: Self-pay

## 2019-05-29 ENCOUNTER — Encounter: Payer: Self-pay | Admitting: Sports Medicine

## 2019-05-29 DIAGNOSIS — M79671 Pain in right foot: Secondary | ICD-10-CM

## 2019-05-29 DIAGNOSIS — D689 Coagulation defect, unspecified: Secondary | ICD-10-CM | POA: Diagnosis not present

## 2019-05-29 DIAGNOSIS — M792 Neuralgia and neuritis, unspecified: Secondary | ICD-10-CM

## 2019-05-29 DIAGNOSIS — M79672 Pain in left foot: Secondary | ICD-10-CM

## 2019-05-29 NOTE — Patient Instructions (Signed)
Recommend topical Voltaren, Aspercreme, capsaicin, or Biofreeze or icy hot topical for nerve pain bilateral use at bedtime or multiple times per day as needed

## 2019-05-29 NOTE — Progress Notes (Signed)
Subjective: Judith Solis is a 73 y.o. female patient who presents to office for evaluation of bilateral foot pain. Patient complains of burning pain on the medial side of the right foot and at the first toe on the left that has been going on for the last couple of months reports that pain is 6-7 out of 10 worse when relaxing or at bedtime.  Patient reports that she has been dealing with some low back problems and has been going for massages but denies any other acute symptoms patient also wants to verify that her symptoms are not related to her circulation that she knows she has a lot of varicose vein issues.  Patient denies any other pedal complaints. Denies injury/trip/fall/sprain/any causative factors.   Patient Active Problem List   Diagnosis Date Noted  . 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 DDD (degenerative disc disease), cervical 09/12/2017  . Cervical spondylosis without myelopathy 08/29/2017  . Hyperlipidemia 04/05/2017  . Essential hypertension 04/05/2017  . PVCs (premature ventricular contractions) 10/21/2016    Current Outpatient Medications on File Prior to Visit  Medication Sig Dispense Refill  . amoxicillin (AMOXIL) 500 MG capsule TAKE FOUR CAPSULES BY MOUTH ONE HOUR BEFORE DENTAL APPOINTMENT    . atorvastatin (LIPITOR) 10 MG tablet Take 10 mg by mouth at bedtime.     . citalopram (CELEXA) 10 MG tablet Take 10 mg by mouth daily.    . clobetasol cream (TEMOVATE) [id number] % Apply 1 application topically 2 (two) times daily as needed (vaginal irritation).    . magnesium gluconate (MAGONATE) 500 MG tablet Take 500 mg by mouth at bedtime.    . Multiple Vitamin (MULTIVITAMIN) tablet Take 1 tablet by mouth daily.    . pantoprazole (PROTONIX) 40 MG tablet Take 40 mg by mouth daily.    . pramipexole (MIRAPEX) 0.125 MG tablet Take 0.125 mg by mouth every night at bedtime    . Probiotic Product (PROBIOTIC-10 PO) Take 1 tablet by mouth at bedtime.    . psyllium  (REGULOID) 0.52 g capsule Take 0.52 g by mouth daily.    . traZODone (DESYREL) 100 MG tablet Take 100 mg by mouth at bedtime.     . VENTOLIN HFA 108 (90 Base) MCG/ACT inhaler Inhale 1 puff into the lungs every 6 (six) hours as needed for wheezing or shortness of breath.      No current facility-administered medications on file prior to visit.     Allergies  Allergen Reactions  . Plavix [Clopidogrel Bisulfate] Itching  . Codeine Nausea And Vomiting  . Oxycodone-Acetaminophen Other (See Comments)    Hallucinations    Objective:  General: Alert and oriented x3 in no acute distress  Dermatology: No open lesions bilateral lower extremities, no webspace macerations, no ecchymosis bilateral, all nails x 10 are well manicured.  Vascular: Dorsalis Pedis and Posterior Tibial pedal pulses palpable, 1 out of 4, capillary Fill Time 5 seconds,(-) pedal hair growth bilateral, no edema bilateral lower extremities, Temperature gradient within normal limits.  Varicosities present bilateral.  Neurology: Gross sensation intact via light touch bilateral, subjective burning pain bilateral.   Musculoskeletal: No reproducible tenderness noted bilateral there is significant digital deformity as well as midfoot arthritis noted bilateral. Strength within normal limits in all groups bilateral.   Gait: Non-Antalgic gait  Assessment and Plan: Problem List Items Addressed This Visit    None    Visit Diagnoses    Neuritis    -  Primary   Foot pain, bilateral  Coagulopathy ([place])           -Complete examination performed -Patient declined x-rays at this visit -Discussed treatment options for likely neuritis possibly related to low back issues -Recommend over-the-counter topical pain creams and rubs advised patient if fail to continue to improve her symptoms may benefit from a nerve study versus a work-up of her back symptoms by her PCP -Encouraged good supportive shoes daily -Patient to return to  office as needed or sooner if condition worsens.  [name], DPM

## 2019-06-26 DIAGNOSIS — R05 Cough: Secondary | ICD-10-CM | POA: Diagnosis not present

## 2019-06-26 DIAGNOSIS — Z20828 Contact with and (suspected) exposure to other viral communicable diseases: Secondary | ICD-10-CM | POA: Diagnosis not present

## 2019-06-26 DIAGNOSIS — R519 Headache, unspecified: Secondary | ICD-10-CM | POA: Diagnosis not present

## 2019-06-26 DIAGNOSIS — U071 COVID-19: Secondary | ICD-10-CM | POA: Diagnosis not present

## 2019-07-19 IMAGING — CT CT ANGIO CHEST
3 of 13 series · 16 of 37 positions shown · IV contrast (APPLIED)
Comparison: 01/27/2018 chest radiograph. 05/30/2015 chest CT
angiogram.

CLINICAL DATA: Severe aortic stenosis, symptomatic. Pre-TAVR
evaluation.

EXAM:
CT ANGIOGRAPHY CHEST, ABDOMEN AND PELVIS
TECHNIQUE: Multidetector CT imaging through the chest, abdomen and pelvis was
performed using the standard protocol during bolus administration of
intravenous contrast. Multiplanar reconstructed images and MIPs were
obtained and reviewed to evaluate the vascular anatomy.
CONTRAST:  100mL 231623-[id number] IOPAMIDOL (231623-EET) INJECTION 76%

[Series 5: best diast 78 % · axial · 0.39mm/px · z∈[+1137,+1286]mm · 5 of 558 slices shown]
[im 93/558  lung]
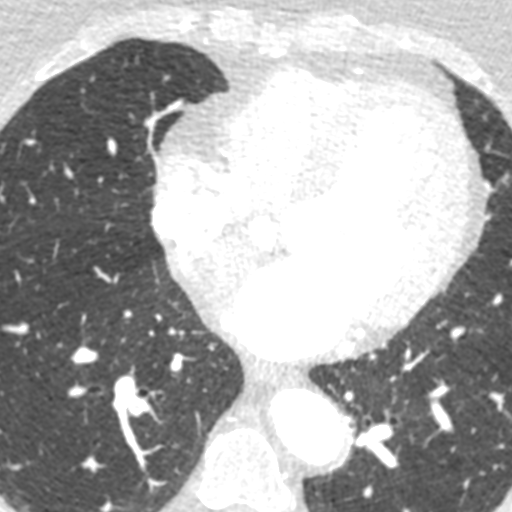
[im 186/558  lung]
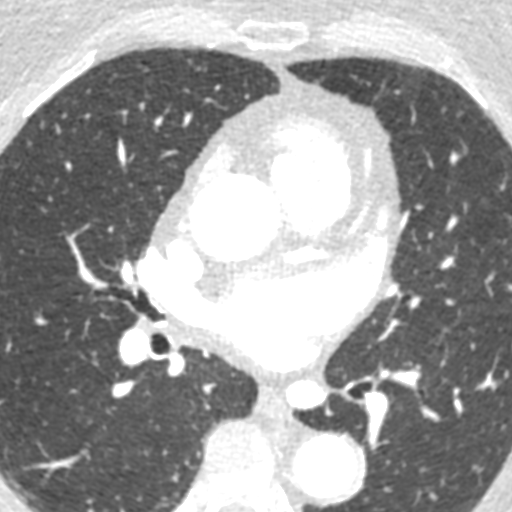
[im 279/558  lung]
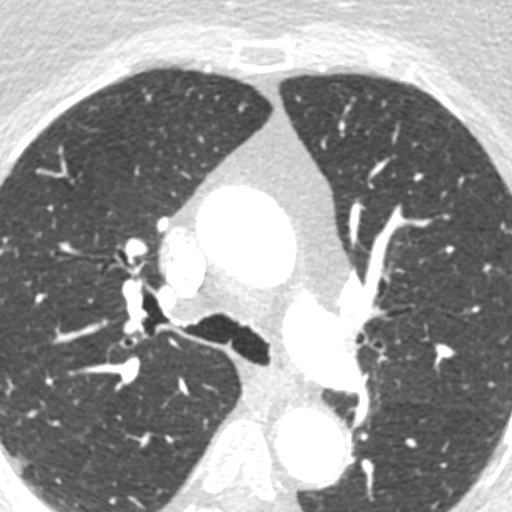
[im 372/558  lung]
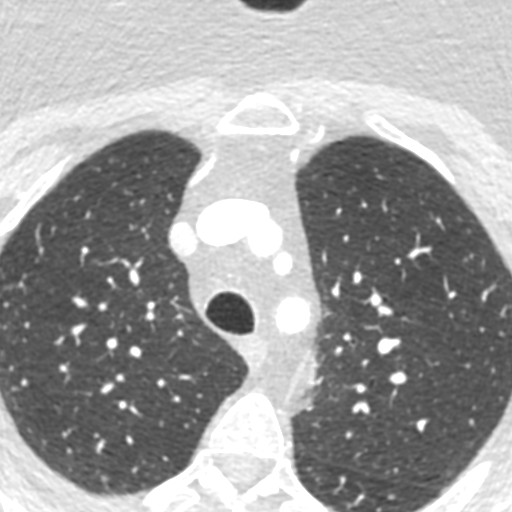
[im 465/558  lung]
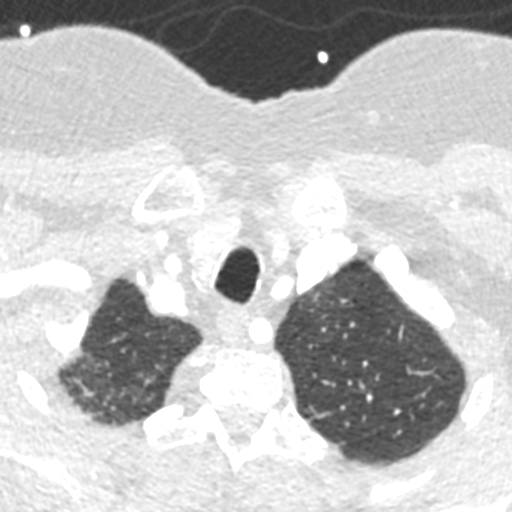

[Series 6: best syst 30 % · axial · 0.39mm/px · z∈[+1137,+1286]mm · 5 of 558 slices shown]
[im 93/558  lung]
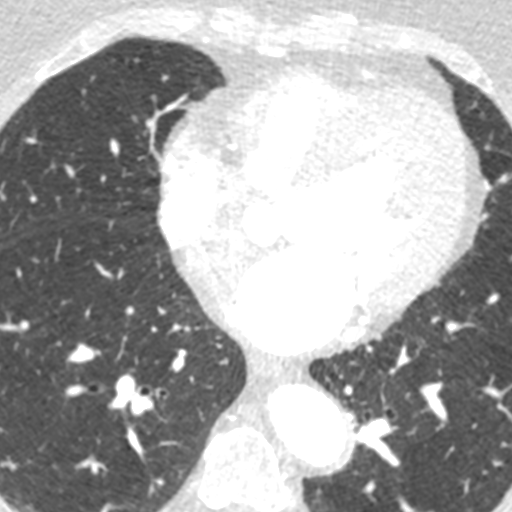
[im 186/558  lung]
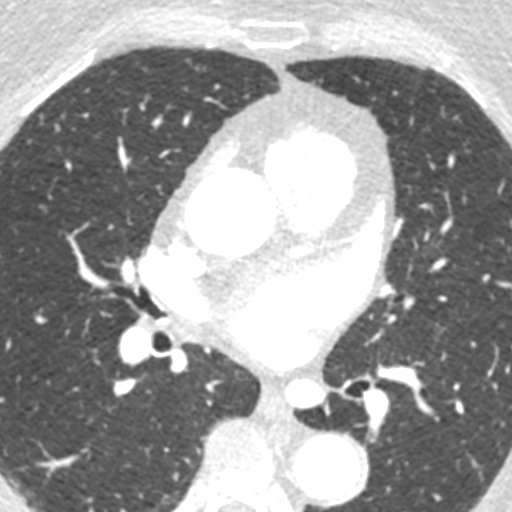
[im 279/558  lung]
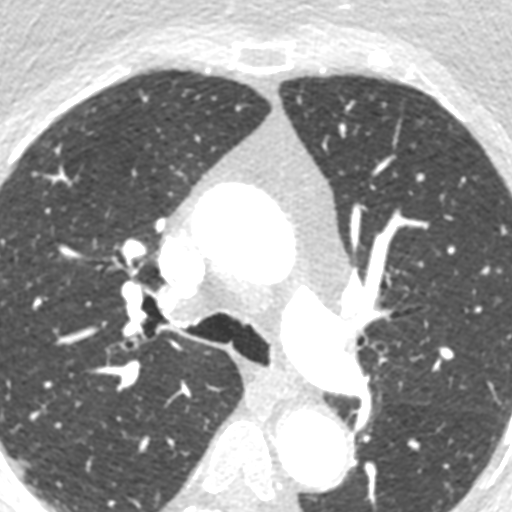
[im 372/558  lung]
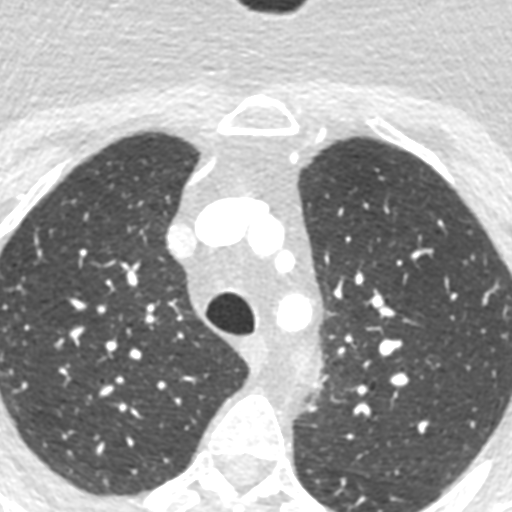
[im 465/558  lung]
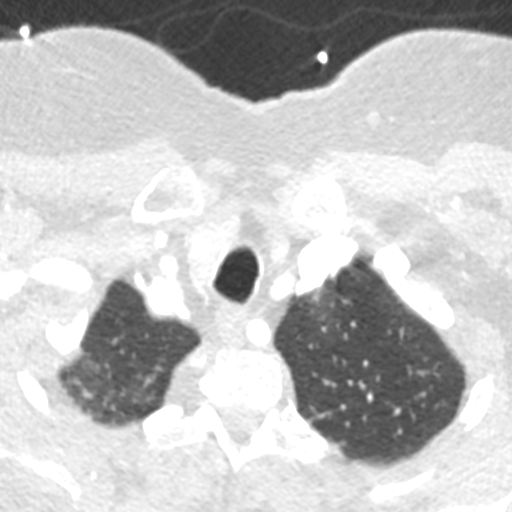

[Series 13: ax thins · axial · 0.91mm/px · z∈[+760,+1230]mm · 6 of 659 slices shown]
[im 95/659  lung]
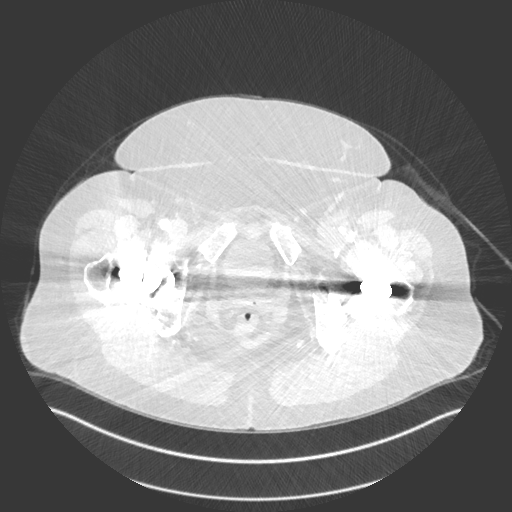
[im 189/659  mediastinal]
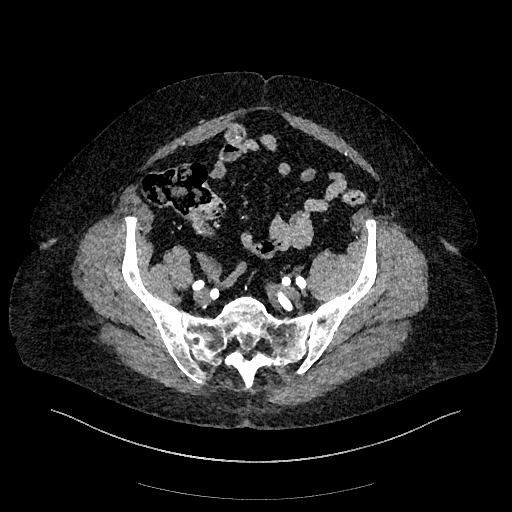
[im 283/659  lung]
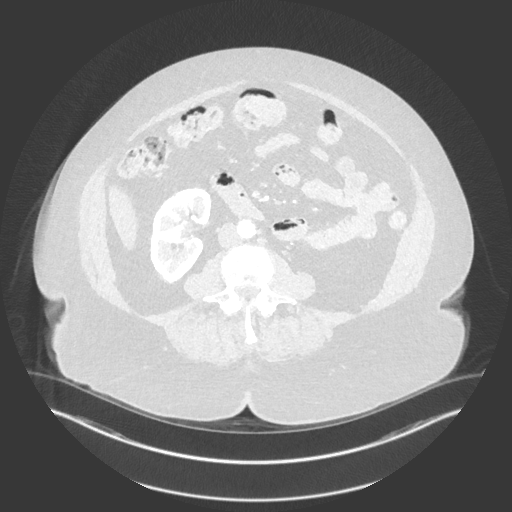
[im 377/659  mediastinal]
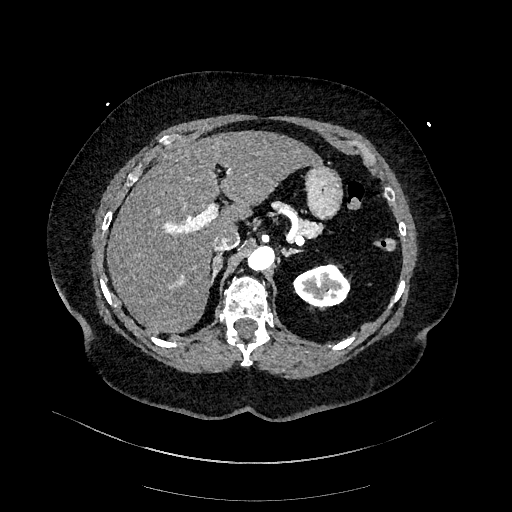
[im 471/659  lung]
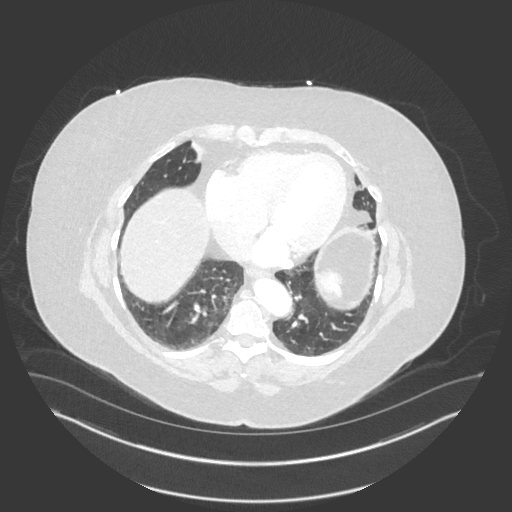
[im 565/659  mediastinal]
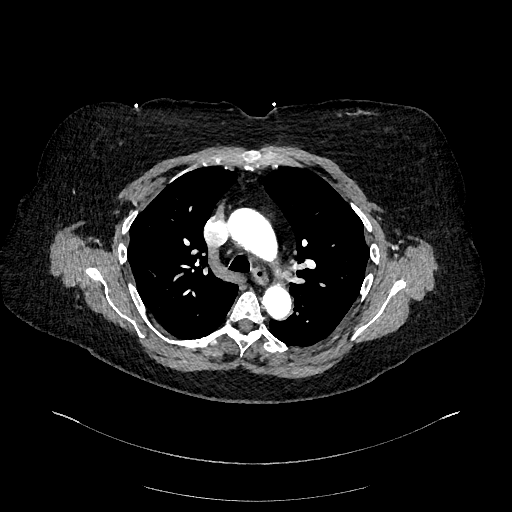

[16 of 37 positions shown; findings below may reference images not displayed]

FINDINGS: CTA CHEST FINDINGS

Cardiovascular: Borderline mild cardiomegaly. No significant
pericardial effusion/thickening. Severe thickening and calcification
of the aortic valve. Left anterior descending coronary
atherosclerosis. Atherosclerotic nonaneurysmal thoracic aorta.
Normal caliber pulmonary arteries. No central pulmonary emboli.

Mediastinum/Nodes: Subcentimeter hypodense anterior right thyroid
lobe nodule. Unremarkable esophagus. No pathologically enlarged
axillary, mediastinal or hilar lymph nodes.

Lungs/Pleura: No pneumothorax. No pleural effusion. No acute
consolidative airspace disease, lung masses or significant pulmonary
nodules.

Musculoskeletal: No aggressive appearing focal osseous lesions.
Moderate lower thoracic spondylosis.

Review of the MIP images confirms the above findings.

CTA ABDOMEN AND PELVIS FINDINGS

Hepatobiliary: Normal liver with no liver mass. Normal gallbladder
with no radiopaque cholelithiasis. No biliary ductal dilatation.

Pancreas: Normal, with no mass or duct dilation.

Spleen: Normal size. No mass.

Adrenals/Urinary Tract: Right adrenal 1.7 cm nodule (series 14/image
97) is stable in size since 05/30/2015 chest CT, compatible with a
benign adenoma. No left adrenal nodule. Simple 1.3 cm interpolar
left renal cyst. Otherwise normal kidneys, with no hydronephrosis.
Nondistended bladder obscured by streak artifact from bilateral hip
hardware.

Stomach/Bowel: Normal non-distended stomach. Normal caliber small
bowel with no small bowel wall thickening. Normal appendix. Mild
sigmoid diverticulosis, with no large bowel wall thickening or
significant pericolonic fat stranding.

Vascular/Lymphatic: Atherosclerotic nonaneurysmal abdominal aorta.
Patent renal, splenic and portal veins. Large venous varices in the
anterior upper left thigh draining to the left common femoral vein.
No pathologically enlarged lymph nodes in the abdomen or pelvis.

Reproductive: Status post hysterectomy. Hysterectomy margin is
obscured by streak artifact. No adnexal mass.

Other: No pneumoperitoneum, ascites or focal fluid collection.

Musculoskeletal: No aggressive appearing focal osseous lesions.
Bilateral total hip arthroplasty. Moderate lower lumbar spondylosis.

VASCULAR MEASUREMENTS PERTINENT TO TAVR:

AORTA:

Minimal Aortic 4iameter-4O.O x 14.6 mm

Severity of Aortic Calcification-mild-to-moderate

RIGHT PELVIS:

Right Common Iliac Artery -

Minimal Xiameter-KU.R x 9.5 mm

Tortuosity-mild

Calcification-mild

Right External Iliac Artery -

Minimal Jiameter-Z.U x 7.5 mm

Tortuosity-mild-to-moderate

Calcification-none

Right Common Femoral Artery -

Minimal Aiameter-G.A x 5.8 mm

Tortuosity-mild

Calcification-moderate

LEFT PELVIS:

Left Common Iliac Artery -

Minimal Xiameter-KU.R x 9.9 mm

Tortuosity-mild

Calcification-mild

Left External Iliac Artery -

Minimal Aiameter-A.V x 7.2 mm

Tortuosity-moderate

Calcification-none

Left Common Femoral Artery -

Minimal Ciameter-F.T x 6.9 mm

Tortuosity-mild

Calcification-mild

Review of the MIP images confirms the above findings.
IMPRESSION: 1. Vascular findings and measurements pertinent to potential TAVR
procedure, as detailed above.
2. Severe thickening and calcification of the aortic valve,
compatible with the reported clinical history of severe aortic
stenosis.
3.  Aortic Atherosclerosis (19ZUC-HXS.S).
4. Large venous varices in the anterior upper left thigh draining to
the left common femoral vein.
5. Borderline mild cardiomegaly.
6. One vessel coronary atherosclerosis.
7. Right adrenal adenoma, stable.
8. Mild sigmoid diverticulosis.

## 2019-07-27 DIAGNOSIS — D225 Melanocytic nevi of trunk: Secondary | ICD-10-CM | POA: Diagnosis not present

## 2019-07-27 DIAGNOSIS — L820 Inflamed seborrheic keratosis: Secondary | ICD-10-CM | POA: Diagnosis not present

## 2019-07-27 DIAGNOSIS — D2239 Melanocytic nevi of other parts of face: Secondary | ICD-10-CM | POA: Diagnosis not present

## 2019-07-27 DIAGNOSIS — L821 Other seborrheic keratosis: Secondary | ICD-10-CM | POA: Diagnosis not present

## 2019-07-27 DIAGNOSIS — D1801 Hemangioma of skin and subcutaneous tissue: Secondary | ICD-10-CM | POA: Diagnosis not present

## 2019-08-03 DIAGNOSIS — Z23 Encounter for immunization: Secondary | ICD-10-CM | POA: Diagnosis not present

## 2019-08-03 IMAGING — CR DG CHEST 2V
2 series · 2 of 2 positions shown · non-contrast
Comparison: 01/27/2018

CLINICAL DATA: Preoperative evaluation for upcoming aortic valve
surgery

EXAM:
CHEST - 2 VIEW

[w chest pa]
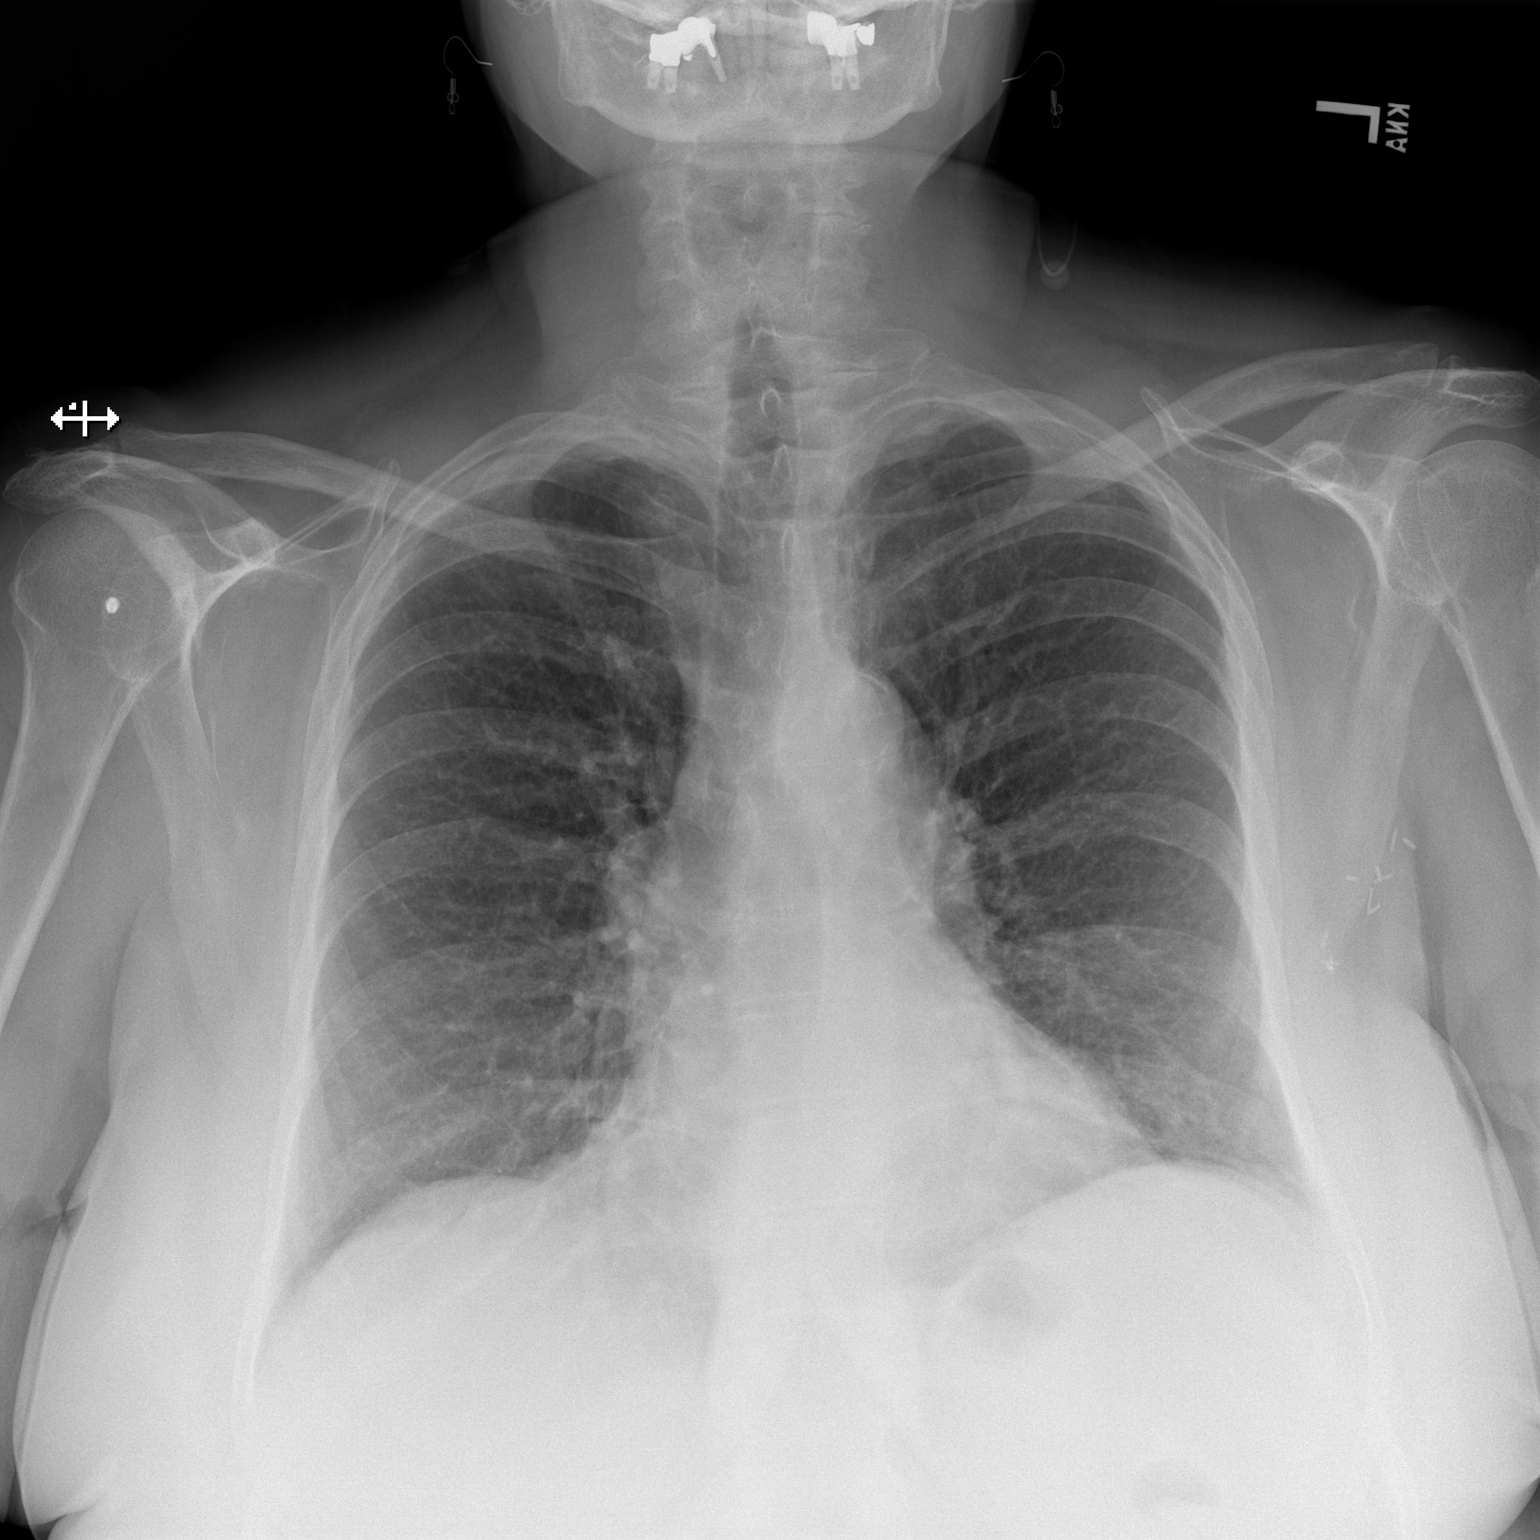

[w chest lat]
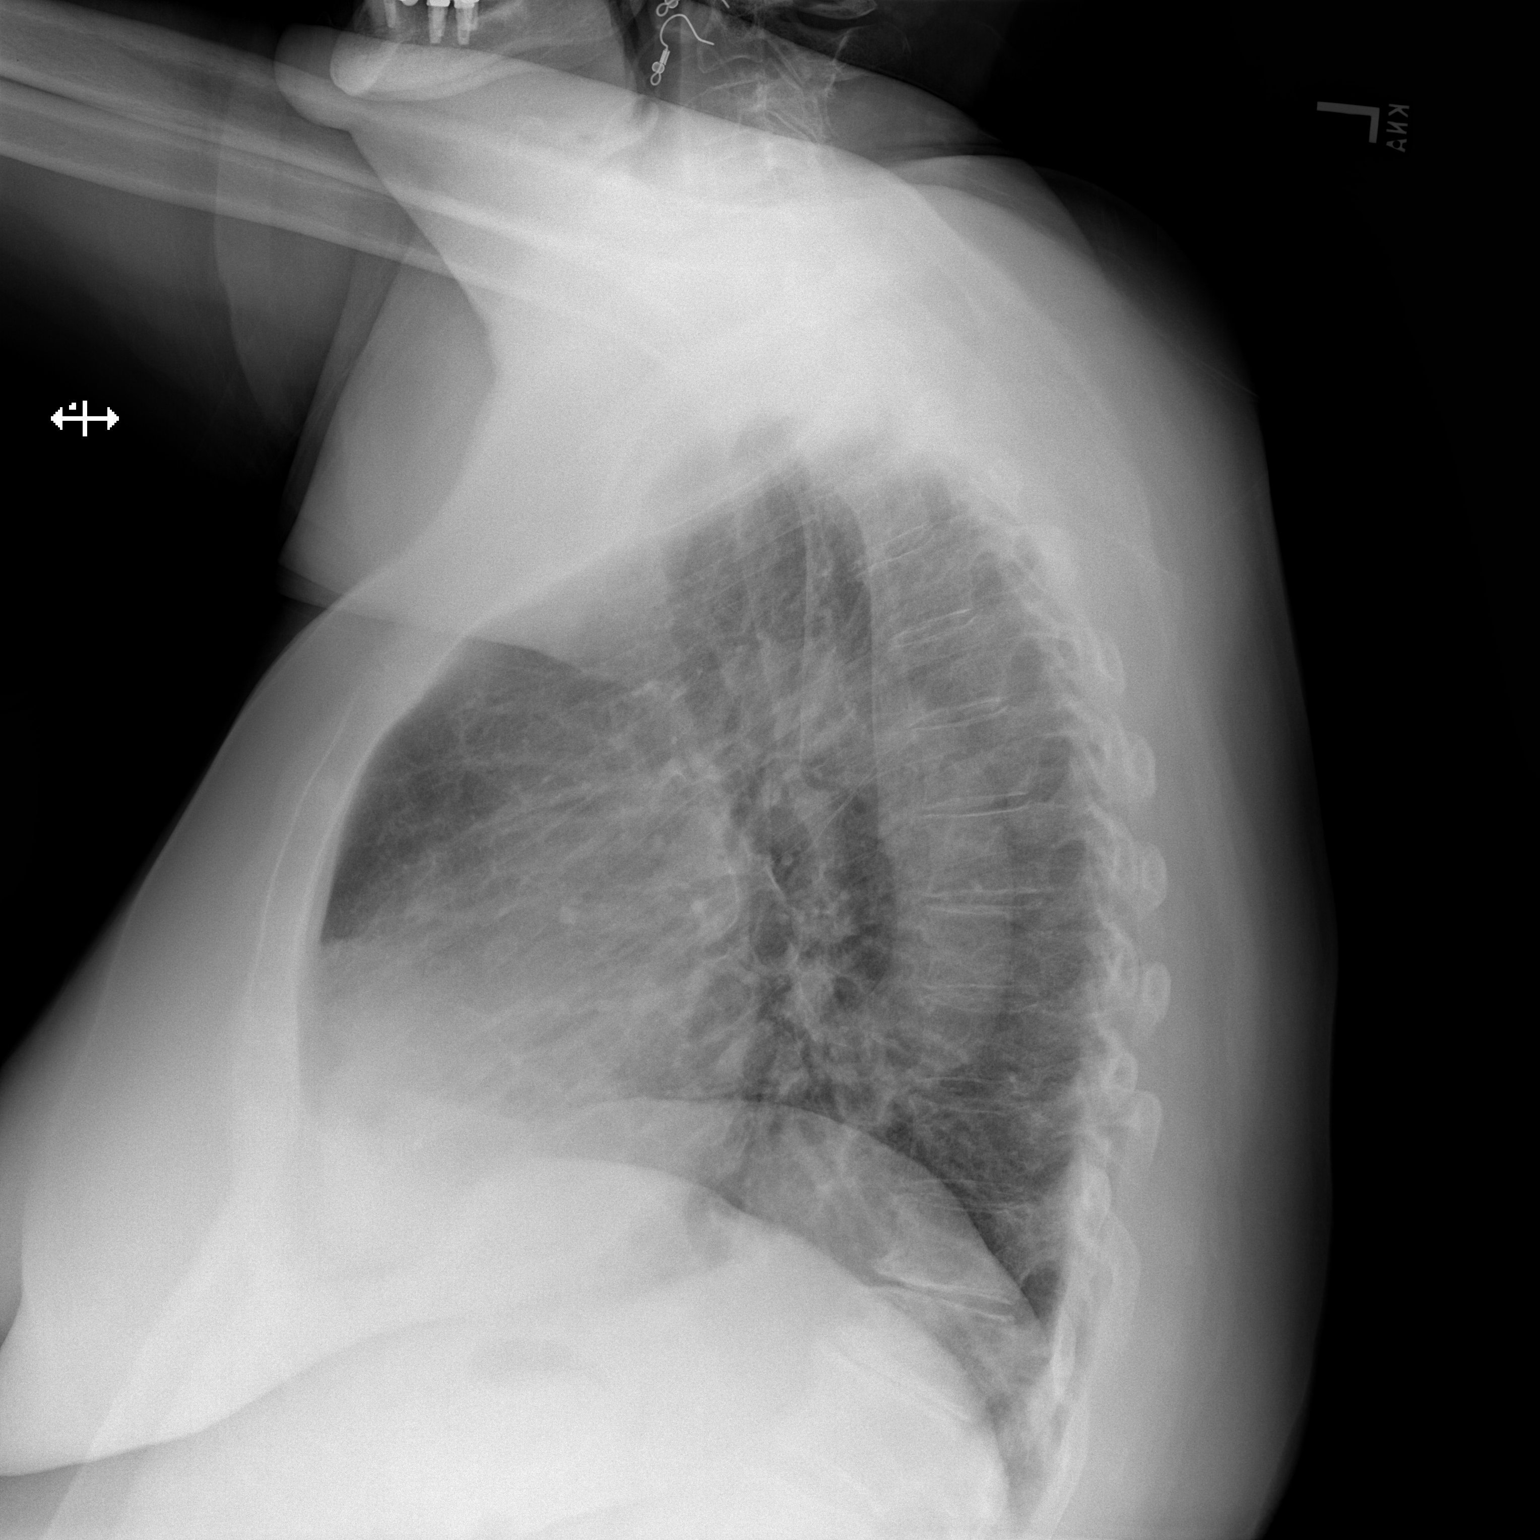

[2 of 2 positions shown; findings below may reference images not displayed]

FINDINGS: Cardiac shadow is within normal limits. Aortic calcifications are
seen. The lungs are clear. No focal infiltrate or sizable effusion
is seen. Degenerative changes of the thoracic spine are noted.
IMPRESSION: No active cardiopulmonary disease.

## 2019-08-28 DIAGNOSIS — M25512 Pain in left shoulder: Secondary | ICD-10-CM | POA: Diagnosis not present

## 2019-08-31 DIAGNOSIS — Z23 Encounter for immunization: Secondary | ICD-10-CM | POA: Diagnosis not present

## 2019-09-07 DIAGNOSIS — Z1231 Encounter for screening mammogram for malignant neoplasm of breast: Secondary | ICD-10-CM | POA: Diagnosis not present

## 2019-09-10 ENCOUNTER — Other Ambulatory Visit: Payer: Medicare Other

## 2019-09-11 ENCOUNTER — Other Ambulatory Visit: Payer: Medicare Other

## 2019-09-25 DIAGNOSIS — Z853 Personal history of malignant neoplasm of breast: Secondary | ICD-10-CM | POA: Diagnosis not present

## 2019-10-29 ENCOUNTER — Other Ambulatory Visit: Payer: Self-pay

## 2019-10-29 ENCOUNTER — Ambulatory Visit (INDEPENDENT_AMBULATORY_CARE_PROVIDER_SITE_OTHER): Payer: Medicare Other

## 2019-10-29 DIAGNOSIS — Z952 Presence of prosthetic heart valve: Secondary | ICD-10-CM | POA: Diagnosis not present

## 2019-10-29 NOTE — Progress Notes (Signed)
Complete echocardiogram performed.  Jimmy Dezaree Tracey RDCS, RVT  

## 2019-10-30 ENCOUNTER — Other Ambulatory Visit: Payer: Self-pay | Admitting: Physician Assistant

## 2019-11-23 DIAGNOSIS — K5790 Diverticulosis of intestine, part unspecified, without perforation or abscess without bleeding: Secondary | ICD-10-CM | POA: Diagnosis not present

## 2019-11-23 DIAGNOSIS — M545 Low back pain: Secondary | ICD-10-CM | POA: Diagnosis not present

## 2019-11-23 DIAGNOSIS — N200 Calculus of kidney: Secondary | ICD-10-CM | POA: Diagnosis not present

## 2019-11-23 DIAGNOSIS — K5730 Diverticulosis of large intestine without perforation or abscess without bleeding: Secondary | ICD-10-CM | POA: Diagnosis not present

## 2019-12-08 DIAGNOSIS — M545 Low back pain: Secondary | ICD-10-CM | POA: Diagnosis not present

## 2019-12-11 DIAGNOSIS — M4316 Spondylolisthesis, lumbar region: Secondary | ICD-10-CM | POA: Diagnosis not present

## 2019-12-11 DIAGNOSIS — M545 Low back pain: Secondary | ICD-10-CM | POA: Diagnosis not present

## 2019-12-11 DIAGNOSIS — M48061 Spinal stenosis, lumbar region without neurogenic claudication: Secondary | ICD-10-CM | POA: Diagnosis not present

## 2019-12-22 DIAGNOSIS — M545 Low back pain: Secondary | ICD-10-CM | POA: Diagnosis not present

## 2020-02-12 DIAGNOSIS — Z6833 Body mass index (BMI) 33.0-33.9, adult: Secondary | ICD-10-CM | POA: Diagnosis not present

## 2020-02-12 DIAGNOSIS — E7800 Pure hypercholesterolemia, unspecified: Secondary | ICD-10-CM | POA: Diagnosis not present

## 2020-02-12 DIAGNOSIS — G44309 Post-traumatic headache, unspecified, not intractable: Secondary | ICD-10-CM | POA: Diagnosis not present

## 2020-02-12 DIAGNOSIS — I10 Essential (primary) hypertension: Secondary | ICD-10-CM | POA: Diagnosis not present

## 2020-02-12 DIAGNOSIS — R7309 Other abnormal glucose: Secondary | ICD-10-CM | POA: Diagnosis not present

## 2020-02-12 DIAGNOSIS — Z79899 Other long term (current) drug therapy: Secondary | ICD-10-CM | POA: Diagnosis not present

## 2020-02-18 DIAGNOSIS — I7090 Unspecified atherosclerosis: Secondary | ICD-10-CM | POA: Diagnosis not present

## 2020-02-18 DIAGNOSIS — M5416 Radiculopathy, lumbar region: Secondary | ICD-10-CM | POA: Diagnosis not present

## 2020-02-18 DIAGNOSIS — I6782 Cerebral ischemia: Secondary | ICD-10-CM | POA: Diagnosis not present

## 2020-02-18 DIAGNOSIS — M4316 Spondylolisthesis, lumbar region: Secondary | ICD-10-CM | POA: Diagnosis not present

## 2020-02-18 DIAGNOSIS — G44309 Post-traumatic headache, unspecified, not intractable: Secondary | ICD-10-CM | POA: Diagnosis not present

## 2020-02-22 DIAGNOSIS — Z01818 Encounter for other preprocedural examination: Secondary | ICD-10-CM | POA: Diagnosis not present

## 2020-02-22 DIAGNOSIS — I700 Atherosclerosis of aorta: Secondary | ICD-10-CM | POA: Diagnosis not present

## 2020-02-22 DIAGNOSIS — R52 Pain, unspecified: Secondary | ICD-10-CM | POA: Diagnosis not present

## 2020-02-22 DIAGNOSIS — Z954 Presence of other heart-valve replacement: Secondary | ICD-10-CM | POA: Diagnosis not present

## 2020-02-22 DIAGNOSIS — M79609 Pain in unspecified limb: Secondary | ICD-10-CM | POA: Diagnosis not present

## 2020-02-22 DIAGNOSIS — E559 Vitamin D deficiency, unspecified: Secondary | ICD-10-CM | POA: Diagnosis not present

## 2020-02-22 DIAGNOSIS — Z79899 Other long term (current) drug therapy: Secondary | ICD-10-CM | POA: Diagnosis not present

## 2020-02-23 ENCOUNTER — Telehealth: Payer: Self-pay

## 2020-02-23 NOTE — Telephone Encounter (Signed)
   Primary Cardiologist:Judith Bettina Gavia, Judith Solis  Chart reviewed as part of pre-operative protocol coverage. Because of Judith Solis's past medical history and time since last visit, they will require a follow-up visit in order to better assess preoperative cardiovascular risk.  Pre-op covering staff: - Please schedule appointment and call patient to inform them. If patient already had an upcoming appointment within acceptable timeframe, please add "pre-op clearance" to the appointment notes so provider is aware. - Please contact requesting surgeon's office via preferred method (i.e, phone, fax) to inform them of need for appointment prior to surgery.  If applicable, this message will also be routed to pharmacy pool and/or primary cardiologist for input on holding anticoagulant/antiplatelet agent as requested below so that this information is available to the clearing provider at time of patient's appointment.   [place], [place]  02/23/2020, 7:28 PM

## 2020-02-23 NOTE — Telephone Encounter (Signed)
   [facility] Pre-operative Risk Assessment    HEARTCARE STAFF: - Please ensure there is not already an duplicate clearance open for this procedure. - Under Visit Info/Reason for Call, type in Other and utilize the format Clearance MM/DD/YY or Clearance TBD. Do not use dashes or single digits. - If request is for dental extraction, please clarify the # of teeth to be extracted.  Request for surgical clearance:  1. What type of surgery is being performed? Lumbar 4-5, posterior lumbar interbody fusion with instrumentation and other procedures as indicated.   2. When is this surgery scheduled? TBD   3. What type of clearance is required (medical clearance vs. Pharmacy clearance to hold med vs. Both)? Both  4. Are there any medications that need to be held prior to surgery and how long?None specified   5. Practice name and name of physician performing surgery? [facility]. Dr. Donivan Scull   6. What is the office phone number? [phone]   7.   What is the office fax number? [phone]  8.   Anesthesia type (None, local, MAC, general) ? General   Judith Solis 02/23/2020, 11:41 AM  _________________________________________________________________   (provider comments below)

## 2020-02-24 DIAGNOSIS — L820 Inflamed seborrheic keratosis: Secondary | ICD-10-CM | POA: Diagnosis not present

## 2020-02-24 DIAGNOSIS — D225 Melanocytic nevi of trunk: Secondary | ICD-10-CM | POA: Diagnosis not present

## 2020-02-24 DIAGNOSIS — L570 Actinic keratosis: Secondary | ICD-10-CM | POA: Diagnosis not present

## 2020-02-24 DIAGNOSIS — L578 Other skin changes due to chronic exposure to nonionizing radiation: Secondary | ICD-10-CM | POA: Diagnosis not present

## 2020-02-24 DIAGNOSIS — L814 Other melanin hyperpigmentation: Secondary | ICD-10-CM | POA: Diagnosis not present

## 2020-02-24 NOTE — Telephone Encounter (Signed)
Left message for the pt to call to schedule a pre op appt with Dr. Bettina Gavia in the [facility] office.

## 2020-02-24 NOTE — Telephone Encounter (Signed)
appt 02/25/20 @ 10 am with Dr. Bettina Gavia. I will forward clearance notes to MD for appt tomorrow. Will remove from the pre op call back pool.

## 2020-02-25 ENCOUNTER — Encounter: Payer: Self-pay | Admitting: Cardiology

## 2020-02-25 ENCOUNTER — Ambulatory Visit (INDEPENDENT_AMBULATORY_CARE_PROVIDER_SITE_OTHER): Payer: Medicare Other | Admitting: Cardiology

## 2020-02-25 ENCOUNTER — Other Ambulatory Visit: Payer: Self-pay

## 2020-02-25 VITALS — BP 136/82 | HR 78 | Ht 65.0 in | Wt 203.0 lb

## 2020-02-25 DIAGNOSIS — I447 Left bundle-branch block, unspecified: Secondary | ICD-10-CM | POA: Diagnosis not present

## 2020-02-25 DIAGNOSIS — Z952 Presence of prosthetic heart valve: Secondary | ICD-10-CM

## 2020-02-25 DIAGNOSIS — I10 Essential (primary) hypertension: Secondary | ICD-10-CM

## 2020-02-25 DIAGNOSIS — Z01810 Encounter for preprocedural cardiovascular examination: Secondary | ICD-10-CM | POA: Diagnosis not present

## 2020-02-25 DIAGNOSIS — E7800 Pure hypercholesterolemia, unspecified: Secondary | ICD-10-CM

## 2020-02-25 NOTE — Progress Notes (Signed)
Cardiology Office Note:    Date:  02/25/2020   ID:  Judith Solis, [place] 1945-11-17, MRN [id number]  PCP:  Ronita Hipps, MD  Cardiologist:  Shirlee More, MD    Referring MD: Ronita Hipps, MD    ASSESSMENT:    1. Preoperative cardiovascular examination   2. S/P TAVR (transcatheter aortic valve replacement)   3. Essential hypertension   4. Pure hypercholesterolemia   5. Left bundle branch block    PLAN:    In order of problems listed above:  1. Her surgery is elective intermediate risk and the patient's cardiovascular concerns includes aortic stenosis with successful valve replacement and a good course afterwards hypertension well-controlled dyslipidemia on a statin and left bundle branch block that is new compared to 2019.  My opinion she is optimized from a cardiovascular perspective requires no further evaluation postop would like to see her in a monitored bed do an EKG postoperative day 1 any problems call my practice to come and see her.  She has stopped her aspirin for 7 days prior to surgery and the surgeon will tell her when to resume usually 24 to 48 hours afterwards. 2. BP at target presently not on antihypertensive agents 3. Continue high intensity statin guideline directed with aortic stenosis 4. 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   Next appointment: I will see her routinely in 6 months   Medication Adjustments/Labs and Tests Ordered: Current medicines are reviewed at length with the patient today.  Concerns regarding medicines are outlined above.  No orders of the defined types were placed in this encounter.  No orders of the defined types were placed in this encounter.   Chief Complaint  Patient presents with  . Pre-op Exam    History of Present Illness:    Judith Solis is a 74 y.o. female with a hx of hypertension, hyperlipidemia and aortic stenosis  with  TAVR 03/04/18  last seen 05/22/2019.  Compliance with diet, lifestyle and medications: Yes  I reviewed her echocardiogram with her.  I independently reviewed the echocardiogram done by the structural heart team in [facility] 10/29/2019 which showed persistent increase gradient across her TAVR stable from her previous echocardiogram peak and mean 2817 mmHg left ventricular function remains normal mild to moderate mitral regurgitation was seen.  Her proBNP level remains low and stable:  Ref Range & Units 9 mo ago 11 mo ago  NT-Pro BNP 0 - 738 pg/mL 101  102 CM   Her revised cardiac risk score for noncardiac surgery is 0 low risk.  She continues to do well and since her TAVR has had no chest pain shortness of breath edema palpitation or syncope and her valve function is normal.  She already is instructed to stop aspirin 7 days preoperative and I told her surgeon will tell when she can resume usually 24 to 48 hours after surgery.  She think she is going to stay overnight and she should go to a monitored bed check EKG postoperative day 1 if any problems call my service to come in severe continue her other usual cardiac medications throughout the admission.  She is optimized and requires no further preoperative cardiovascular evaluation.  I independently reviewed the EKG done at [facility] 02/22/2020 that shows sinus rhythm left bundle branch block EKG for comparison is 2019 when bundle branch block was not present. Past Medical History:  Diagnosis Date  . Aortic valve regurgitation   . Arthritis   . Asthma   . Breast cancer ([place])    a. s/p chemo and radiation  . GERD (gastroesophageal reflux disease)   . Hyperlipidemia   . Hypertension   . Morbid obesity ([place])   . 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 Severe aortic stenosis     Past Surgical History:    Procedure Laterality Date  . ABDOMINAL HYSTERECTOMY    . BELPHAROPTOSIS REPAIR Bilateral   . BREAST SURGERY     LUMPECTOMY WITH CHEMO & RADIATION  . CATARACT EXTRACTION    . EYE SURGERY    . INTRAOPERATIVE TRANSTHORACIC ECHOCARDIOGRAM  03/04/2018   Procedure: INTRAOPERATIVE TRANSTHORACIC ECHOCARDIOGRAM;  Surgeon: Sherren Mocha, MD;  Location: [facility] OR;  Service: Open Heart Surgery;;  . JOINT REPLACEMENT     BILATERAL HIPS 2007 & 2008  . RIGHT/LEFT HEART CATH AND CORONARY ANGIOGRAPHY N/A 01/31/2018   Procedure: RIGHT/LEFT HEART CATH AND CORONARY ANGIOGRAPHY;  Surgeon: Burnell Blanks, MD;  Location: [facility];  Service: Cardiovascular;  Laterality: N/A;  . ROTATOR CUFF REPAIR    . TONSILLECTOMY    . TRANSCATHETER AORTIC VALVE REPLACEMENT, TRANSFEMORAL N/A 03/04/2018   Procedure: TRANSCATHETER AORTIC VALVE REPLACEMENT, TRANSFEMORAL. EDWARDS 26MM SAPIEN 3 TRANSCATHETER HEART VALVE.;  Surgeon: Sherren Mocha, MD;  Location: [place];  Service: Open Heart Surgery;  Laterality: N/A;    Current Medications: Current Meds  Medication Sig  . amoxicillin (AMOXIL) 500 MG capsule TAKE FOUR CAPSULES BY MOUTH ONE HOUR BEFORE DENTAL APPOINTMENT  . atorvastatin (LIPITOR) 10 MG tablet Take 10 mg by mouth at bedtime.   . citalopram (CELEXA) 10 MG tablet Take 10 mg by mouth daily.  . clobetasol cream (TEMOVATE) 9.82 % Apply 1 application topically 2 (two) times daily as needed (vaginal irritation).  . magnesium gluconate (MAGONATE) 500 MG tablet Take 500 mg by mouth at bedtime.  . Multiple Vitamin (MULTIVITAMIN) tablet Take 1 tablet by mouth daily.  . pantoprazole (PROTONIX) 40 MG tablet Take 40 mg by mouth daily.  . pramipexole (MIRAPEX) 0.125 MG tablet Take 0.125 mg by mouth every night at bedtime  . Probiotic Product (PROBIOTIC-10 PO) Take 1 tablet by mouth at bedtime.  . psyllium (REGULOID) 0.52 g capsule Take 0.52 g by mouth daily.  . traZODone (DESYREL) 100 MG tablet Take 100 mg by  mouth at bedtime.   . VENTOLIN HFA 108 (90 Base) MCG/ACT inhaler Inhale 1 puff into the lungs every 6 (six) hours as needed for wheezing or shortness of breath.      Allergies:   Plavix [clopidogrel bisulfate], Codeine, and Oxycodone-acetaminophen   Social History   Socioeconomic History  . Marital status: Widowed    Spouse name: Not on file  . Number of children: Not on file  . Years of education: Not on file  . Highest education level: Not on file  Occupational History  . Not on file  Tobacco Use  . Smoking status: Former Smoker    Packs/day: 1.50    Years: 20.00    Pack years: 30.00    Types: Cigarettes    Quit date: 06/04/1990    Years since quitting: 29.7  . Smokeless tobacco: Never Used  Vaping Use  . Vaping Use: Never used  Substance and Sexual Activity  . Alcohol use: No  . Drug use: No  .  Sexual activity: Not on file  Other Topics Concern  . Not on file  Social History Narrative  . Not on file   Social Determinants of Health   Financial Resource Strain:   . Difficulty of Paying Living Expenses:   Food Insecurity:   . Worried About Charity fundraiser in the Last Year:   . Arboriculturist in the Last Year:   Transportation Needs:   . Film/video editor (Medical):   Marland Kitchen Lack of Transportation (Non-Medical):   Physical Activity:   . Days of Exercise per Week:   . Minutes of Exercise per Session:   Stress:   . Feeling of Stress :   Social Connections:   . Frequency of Communication with Friends and Family:   . Frequency of Social Gatherings with Friends and Family:   . Attends Religious Services:   . Active Member of Clubs or Organizations:   . Attends Archivist Meetings:   Marland Kitchen Marital Status:      Family History: The patient's family history includes Diabetes Mellitus II in her paternal grandfather, sister, and sister; Heart Problems in her brother and sister; Heart disease in her brother, maternal grandfather, and mother; Hepatitis C in  her sister; Lung cancer in her sister; Stroke in her father; Throat cancer in her sister. ROS:   Please see the history of present illness.    All other systems reviewed and are negative.  EKGs/Labs/Other Studies Reviewed:    The following studies were reviewed today: Recent Labs: 03/13/2019: Hemoglobin 12.9; Platelets 215 05/22/2019: ALT 29; BUN 21; Creatinine, Ser 0.97; NT-Pro BNP 101; Potassium 4.6; Sodium 140  Recent Lipid Panel    Component Value Date/Time   CHOL 165 05/22/2019 1445   TRIG 221 (H) 05/22/2019 1445   HDL 32 (L) 05/22/2019 1445   CHOLHDL 5.2 (H) 05/22/2019 1445   LDLCALC 95 05/22/2019 1445    Physical Exam:    VS:  BP 136/82   Pulse 78   Ht 5\' 5"  (1.651 m)   Wt 203 lb (92.1 kg)   SpO2 95%   BMI 33.78 kg/m     Wt Readings from Last 3 Encounters:  02/25/20 203 lb (92.1 kg)  05/22/19 204 lb 6.4 oz (92.7 kg)  03/13/19 206 lb (93.4 kg)     GEN: She looks healthy well nourished, well developed in no acute distress HEENT: Normal NECK: No JVD; No carotid bruits LYMPHATICS: No lymphadenopathy CARDIAC: Grade 1/6 midsystolic murmur does not peak late does not encompass S2 no radiation RRR, no , rubs, gallops RESPIRATORY:  Clear to auscultation without rales, wheezing or rhonchi  ABDOMEN: Soft, non-tender, non-distended MUSCULOSKELETAL:  No edema; No deformity  SKIN: Warm and dry NEUROLOGIC:  Alert and oriented x 3 PSYCHIATRIC:  Normal affect    Signed, Shirlee More, MD  02/25/2020 10:24 AM    [place]

## 2020-02-25 NOTE — Patient Instructions (Signed)

## 2020-03-01 ENCOUNTER — Telehealth: Payer: Self-pay | Admitting: Cardiology

## 2020-03-01 NOTE — Telephone Encounter (Signed)
   Primary Cardiologist: Shirlee More, MD  Chart reviewed, pt seen by Dr Bettina Gavia 02/25/2020 as part of pre-operative protocol coverage. Given past medical history and time since last visit, based on ACC/AHA guidelines, Judith Solis would be at acceptable risk for the planned procedure without further cardiovascular testing.   Dr Bettina Gavia suggests telemetry post op as well as an EKG post op. Please contact Dr [facility] office if there are any issues in the peri operative period. OK to hold aspirin 7 days pre op, resume when safe post op (24-48 hours).  I will route this recommendation to the requesting party via Epic fax function and remove from pre-op pool.  Please call with questions.  Kerin Ransom, PA-C 03/01/2020, 3:18 PM

## 2020-03-01 NOTE — Telephone Encounter (Signed)
    [name] from [facility] calling to follow up clearance for the pt. She gave fax# [phone]

## 2020-03-08 DIAGNOSIS — Z1159 Encounter for screening for other viral diseases: Secondary | ICD-10-CM | POA: Diagnosis not present

## 2020-03-08 DIAGNOSIS — Z1152 Encounter for screening for COVID-19: Secondary | ICD-10-CM | POA: Diagnosis not present

## 2020-03-14 DIAGNOSIS — Z96643 Presence of artificial hip joint, bilateral: Secondary | ICD-10-CM | POA: Diagnosis not present

## 2020-03-14 DIAGNOSIS — K219 Gastro-esophageal reflux disease without esophagitis: Secondary | ICD-10-CM | POA: Diagnosis not present

## 2020-03-14 DIAGNOSIS — I10 Essential (primary) hypertension: Secondary | ICD-10-CM | POA: Diagnosis not present

## 2020-03-14 DIAGNOSIS — E785 Hyperlipidemia, unspecified: Secondary | ICD-10-CM | POA: Diagnosis not present

## 2020-03-14 DIAGNOSIS — M48062 Spinal stenosis, lumbar region with neurogenic claudication: Secondary | ICD-10-CM | POA: Diagnosis not present

## 2020-03-14 DIAGNOSIS — Z7902 Long term (current) use of antithrombotics/antiplatelets: Secondary | ICD-10-CM | POA: Diagnosis not present

## 2020-03-14 DIAGNOSIS — E669 Obesity, unspecified: Secondary | ICD-10-CM | POA: Diagnosis not present

## 2020-03-14 DIAGNOSIS — M5116 Intervertebral disc disorders with radiculopathy, lumbar region: Secondary | ICD-10-CM | POA: Diagnosis not present

## 2020-03-14 DIAGNOSIS — M4316 Spondylolisthesis, lumbar region: Secondary | ICD-10-CM | POA: Diagnosis not present

## 2020-03-14 DIAGNOSIS — Z7982 Long term (current) use of aspirin: Secondary | ICD-10-CM | POA: Diagnosis not present

## 2020-03-14 DIAGNOSIS — M5416 Radiculopathy, lumbar region: Secondary | ICD-10-CM | POA: Diagnosis not present

## 2020-03-14 DIAGNOSIS — Z87891 Personal history of nicotine dependence: Secondary | ICD-10-CM | POA: Diagnosis not present

## 2020-03-14 DIAGNOSIS — M48061 Spinal stenosis, lumbar region without neurogenic claudication: Secondary | ICD-10-CM | POA: Diagnosis not present

## 2020-03-14 DIAGNOSIS — Z79899 Other long term (current) drug therapy: Secondary | ICD-10-CM | POA: Diagnosis not present

## 2020-03-14 DIAGNOSIS — Z952 Presence of prosthetic heart valve: Secondary | ICD-10-CM | POA: Diagnosis not present

## 2020-03-14 DIAGNOSIS — Z6834 Body mass index (BMI) 34.0-34.9, adult: Secondary | ICD-10-CM | POA: Diagnosis not present

## 2020-03-15 DIAGNOSIS — K219 Gastro-esophageal reflux disease without esophagitis: Secondary | ICD-10-CM | POA: Diagnosis not present

## 2020-03-15 DIAGNOSIS — M5116 Intervertebral disc disorders with radiculopathy, lumbar region: Secondary | ICD-10-CM | POA: Diagnosis not present

## 2020-03-15 DIAGNOSIS — M4316 Spondylolisthesis, lumbar region: Secondary | ICD-10-CM | POA: Diagnosis not present

## 2020-03-15 DIAGNOSIS — M48061 Spinal stenosis, lumbar region without neurogenic claudication: Secondary | ICD-10-CM | POA: Diagnosis not present

## 2020-03-15 DIAGNOSIS — E785 Hyperlipidemia, unspecified: Secondary | ICD-10-CM | POA: Diagnosis not present

## 2020-03-15 DIAGNOSIS — I10 Essential (primary) hypertension: Secondary | ICD-10-CM | POA: Diagnosis not present

## 2020-04-21 DIAGNOSIS — Z23 Encounter for immunization: Secondary | ICD-10-CM | POA: Diagnosis not present

## 2020-05-04 ENCOUNTER — Ambulatory Visit (INDEPENDENT_AMBULATORY_CARE_PROVIDER_SITE_OTHER): Payer: Medicare Other | Admitting: Sports Medicine

## 2020-05-04 ENCOUNTER — Encounter: Payer: Self-pay | Admitting: Sports Medicine

## 2020-05-04 ENCOUNTER — Other Ambulatory Visit: Payer: Self-pay

## 2020-05-04 DIAGNOSIS — M722 Plantar fascial fibromatosis: Secondary | ICD-10-CM | POA: Diagnosis not present

## 2020-05-04 DIAGNOSIS — M79671 Pain in right foot: Secondary | ICD-10-CM | POA: Diagnosis not present

## 2020-05-04 DIAGNOSIS — D689 Coagulation defect, unspecified: Secondary | ICD-10-CM

## 2020-05-04 DIAGNOSIS — Z9889 Other specified postprocedural states: Secondary | ICD-10-CM | POA: Diagnosis not present

## 2020-05-04 DIAGNOSIS — M79672 Pain in left foot: Secondary | ICD-10-CM | POA: Diagnosis not present

## 2020-05-04 NOTE — Progress Notes (Signed)
Subjective: Judith Solis is a 74 y.o. female patient who presents to office for evaluation of right foot pain reports that it hurts on the bottom of her heel as well as she had an episode of sharp shooting pain along the side of the foot that has now self resolved however the pain has been going on especially in the heels that she went for a pedicure and had a massage for the last 3 weeks.  Patient reports that she has not tried anything for treatment and reports that pain hurts worse when she stands and puts pressure on her heel.  Patient denies any other pedal complaints at this time.  Patient Active Problem List   Diagnosis Date Noted  . 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 DDD (degenerative disc disease), cervical 09/12/2017  . Cervical spondylosis without myelopathy 08/29/2017  . Hyperlipidemia 04/05/2017  . Essential hypertension 04/05/2017  . PVCs (premature ventricular contractions) 10/21/2016    Current Outpatient Medications on File Prior to Visit  Medication Sig Dispense Refill  . amoxicillin (AMOXIL) 500 MG capsule TAKE FOUR CAPSULES BY MOUTH ONE HOUR BEFORE DENTAL APPOINTMENT    . atorvastatin (LIPITOR) 10 MG tablet Take 10 mg by mouth at bedtime.     . citalopram (CELEXA) 10 MG tablet Take 10 mg by mouth daily.    . clobetasol cream (TEMOVATE) 6.65 % Apply 1 application topically 2 (two) times daily as needed (vaginal irritation).    . magnesium gluconate (MAGONATE) 500 MG tablet Take 500 mg by mouth at bedtime.    . Multiple Vitamin (MULTIVITAMIN) tablet Take 1 tablet by mouth daily.    . pantoprazole (PROTONIX) 40 MG tablet Take 40 mg by mouth daily.    . pramipexole (MIRAPEX) 0.125 MG tablet Take 0.125 mg by mouth every night at bedtime    . Probiotic Product (PROBIOTIC-10 PO) Take 1 tablet by mouth at bedtime.    . psyllium (REGULOID) 0.52 g capsule Take 0.52 g by mouth daily.    . traZODone (DESYREL) 100 MG tablet Take 100 mg by mouth at  bedtime.     . VENTOLIN HFA 108 (90 Base) MCG/ACT inhaler Inhale 1 puff into the lungs every 6 (six) hours as needed for wheezing or shortness of breath.      No current facility-administered medications on file prior to visit.    Allergies  Allergen Reactions  . Plavix [Clopidogrel Bisulfate] Itching  . Codeine Nausea And Vomiting  . Oxycodone-Acetaminophen Other (See Comments)    Hallucinations    Objective:  General: Alert and oriented x3 in no acute distress  Dermatology: No open lesions bilateral lower extremities, no webspace macerations, no ecchymosis bilateral, all nails x 10 are well manicured.  Vascular: Dorsalis Pedis and Posterior Tibial pedal pulses palpable, 1 out of 4, capillary Fill Time 5 seconds,(-) pedal hair growth bilateral, no edema bilateral lower extremities, Temperature gradient within normal limits.  Varicosities present bilateral.  Neurology: Gross sensation intact via light touch bilateral, subjective burning pain bilateral that has self resolved especially on the right.  Musculoskeletal: Tenderness to palpation noted at the plantar heel on the right.  Fat pad atrophy noted bilateral with bunion and hammertoe deformity.   Gait: Non-Antalgic gait  Assessment and Plan: Problem List Items Addressed This Visit    None    Visit Diagnoses    Plantar fasciitis of right foot    -  Primary   Foot pain, bilateral       Coagulopathy (  [place])       History of back surgery           -Complete examination performed -Discussed treatment options for likely neuritis possibly related to low back issues now status post back surgery 7 weeks ago and plantar fasciitis which is likely her primary concern at this time -Patient declined injection at this time -Dispensed heel lifts for patient to use in shoes and advised gentle stretching and icing as instructed advised patient if symptoms fail to resolve to return to office for steroid injection -Advised patient to continue  with follow-up with her spine doctor to make sure her back heals uneventfully -Patient to return to office as needed or sooner if condition worsens.  [name], DPM

## 2020-05-11 DIAGNOSIS — Z6833 Body mass index (BMI) 33.0-33.9, adult: Secondary | ICD-10-CM | POA: Diagnosis not present

## 2020-05-11 DIAGNOSIS — R413 Other amnesia: Secondary | ICD-10-CM | POA: Diagnosis not present

## 2020-05-11 DIAGNOSIS — M859 Disorder of bone density and structure, unspecified: Secondary | ICD-10-CM | POA: Diagnosis not present

## 2020-05-11 DIAGNOSIS — M8580 Other specified disorders of bone density and structure, unspecified site: Secondary | ICD-10-CM | POA: Diagnosis not present

## 2020-05-11 DIAGNOSIS — M8589 Other specified disorders of bone density and structure, multiple sites: Secondary | ICD-10-CM | POA: Diagnosis not present

## 2020-05-16 DIAGNOSIS — Z6834 Body mass index (BMI) 34.0-34.9, adult: Secondary | ICD-10-CM | POA: Diagnosis not present

## 2020-05-16 DIAGNOSIS — B029 Zoster without complications: Secondary | ICD-10-CM | POA: Diagnosis not present

## 2020-05-21 DIAGNOSIS — K219 Gastro-esophageal reflux disease without esophagitis: Secondary | ICD-10-CM | POA: Diagnosis not present

## 2020-05-21 DIAGNOSIS — I10 Essential (primary) hypertension: Secondary | ICD-10-CM | POA: Diagnosis not present

## 2020-05-21 DIAGNOSIS — E7849 Other hyperlipidemia: Secondary | ICD-10-CM | POA: Diagnosis not present

## 2020-06-21 DIAGNOSIS — K219 Gastro-esophageal reflux disease without esophagitis: Secondary | ICD-10-CM | POA: Diagnosis not present

## 2020-06-21 DIAGNOSIS — I10 Essential (primary) hypertension: Secondary | ICD-10-CM | POA: Diagnosis not present

## 2020-06-21 DIAGNOSIS — E7849 Other hyperlipidemia: Secondary | ICD-10-CM | POA: Diagnosis not present

## 2020-06-22 DIAGNOSIS — Z23 Encounter for immunization: Secondary | ICD-10-CM | POA: Diagnosis not present

## 2020-07-05 ENCOUNTER — Encounter: Payer: Self-pay | Admitting: Sports Medicine

## 2020-07-05 ENCOUNTER — Other Ambulatory Visit: Payer: Self-pay | Admitting: *Deleted

## 2020-07-05 ENCOUNTER — Ambulatory Visit (INDEPENDENT_AMBULATORY_CARE_PROVIDER_SITE_OTHER): Payer: Medicare Other | Admitting: Sports Medicine

## 2020-07-05 ENCOUNTER — Other Ambulatory Visit: Payer: Self-pay

## 2020-07-05 DIAGNOSIS — M79671 Pain in right foot: Secondary | ICD-10-CM

## 2020-07-05 DIAGNOSIS — M792 Neuralgia and neuritis, unspecified: Secondary | ICD-10-CM

## 2020-07-05 DIAGNOSIS — Z9889 Other specified postprocedural states: Secondary | ICD-10-CM

## 2020-07-05 DIAGNOSIS — M722 Plantar fascial fibromatosis: Secondary | ICD-10-CM

## 2020-07-05 DIAGNOSIS — D689 Coagulation defect, unspecified: Secondary | ICD-10-CM

## 2020-07-05 DIAGNOSIS — R200 Anesthesia of skin: Secondary | ICD-10-CM | POA: Diagnosis not present

## 2020-07-05 DIAGNOSIS — Z981 Arthrodesis status: Secondary | ICD-10-CM | POA: Diagnosis not present

## 2020-07-05 DIAGNOSIS — M79672 Pain in left foot: Secondary | ICD-10-CM

## 2020-07-05 DIAGNOSIS — M4316 Spondylolisthesis, lumbar region: Secondary | ICD-10-CM | POA: Diagnosis not present

## 2020-07-05 MED ORDER — TRIAMCINOLONE ACETONIDE 10 MG/ML IJ SUSP
10.0000 mg | Freq: Once | INTRAMUSCULAR | Status: AC
  Administered 2020-07-05: 18:00:00 10 mg

## 2020-07-05 NOTE — Progress Notes (Signed)
Subjective: Judith Solis is a 74 y.o. female patient who presents to office for follow-up evaluation of right foot pain reports right heel hurts worse when she stands and puts pressure on her heel.  Reports that she also has some sharp shooting pain went to the back doctor today and was started on gabapentin.  Patient denies any other pedal complaints at this time.  Patient Active Problem List   Diagnosis Date Noted  . 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 DDD (degenerative disc disease), cervical 09/12/2017  . Cervical spondylosis without myelopathy 08/29/2017  . Hyperlipidemia 04/05/2017  . Essential hypertension 04/05/2017  . PVCs (premature ventricular contractions) 10/21/2016    Current Outpatient Medications on File Prior to Visit  Medication Sig Dispense Refill  . amoxicillin (AMOXIL) 500 MG capsule TAKE FOUR CAPSULES BY MOUTH ONE HOUR BEFORE DENTAL APPOINTMENT    . atorvastatin (LIPITOR) 10 MG tablet Take 10 mg by mouth at bedtime.     . citalopram (CELEXA) 10 MG tablet Take 10 mg by mouth daily.    . clobetasol cream (TEMOVATE) 8.29 % Apply 1 application topically 2 (two) times daily as needed (vaginal irritation).    . magnesium gluconate (MAGONATE) 500 MG tablet Take 500 mg by mouth at bedtime.    . Multiple Vitamin (MULTIVITAMIN) tablet Take 1 tablet by mouth daily.    . pantoprazole (PROTONIX) 40 MG tablet Take 40 mg by mouth daily.    . pramipexole (MIRAPEX) 0.125 MG tablet Take 0.125 mg by mouth every night at bedtime    . Probiotic Product (PROBIOTIC-10 PO) Take 1 tablet by mouth at bedtime.    . psyllium (REGULOID) 0.52 g capsule Take 0.52 g by mouth daily.    . traZODone (DESYREL) 100 MG tablet Take 100 mg by mouth at bedtime.     . VENTOLIN HFA 108 (90 Base) MCG/ACT inhaler Inhale 1 puff into the lungs every 6 (six) hours as needed for wheezing or shortness of breath.      No current facility-administered medications on file prior to visit.     Allergies  Allergen Reactions  . Plavix [Clopidogrel Bisulfate] Itching  . Codeine Nausea And Vomiting  . Oxycodone-Acetaminophen Other (See Comments)    Hallucinations    Objective:  General: Alert and oriented x3 in no acute distress  Dermatology: No open lesions bilateral lower extremities, no webspace macerations, no ecchymosis bilateral, all nails x 10 are well manicured.  Vascular: Dorsalis Pedis and Posterior Tibial pedal pulses palpable, 1 out of 4, capillary Fill Time 5 seconds,(-) pedal hair growth bilateral, no edema bilateral lower extremities, Temperature gradient within normal limits.  Varicosities present bilateral.  Neurology: Gross sensation intact via light touch bilateral, subjective burning pain right thigh and occasionally to the right heel.  Musculoskeletal: Tenderness to palpation noted at the plantar heel on the right.  Fat pad atrophy noted bilateral with bunion and hammertoe deformity.   Gait: Non-Antalgic gait  Assessment and Plan: Problem List Items Addressed This Visit   None   Visit Diagnoses    Plantar fasciitis of right foot    -  Primary   Relevant Medications   triamcinolone acetonide (KENALOG) 10 MG/ML injection 10 mg (Completed) (Start on 07/05/2020  6:15 PM)   Foot pain, bilateral       Coagulopathy (H[facility])       History of back surgery       Neuritis           -Complete examination  performed -Re-Discussed treatment options for likely neuritis possibly related to low back issues and plantar fasciitis After oral consent and aseptic prep, injected a mixture containing 1 ml of 2%  plain lidocaine, 1 ml 0.5% plain marcaine, 0.5 ml of kenalog 10 and 0.5 ml of dexamethasone phosphate into right heel without complication. Post-injection care discussed with patient.  -Advised patient to continue with gabapentin as prescribed by back doctor to see if this will give her some relief of neuritis -Patient declined injection at this time -Continue  with heel lifts for patient to use in shoes and advised gentle stretching and icing as previously recommended -Patient to return to office as needed or sooner if condition worsens.  [name], DPM

## 2020-07-12 DIAGNOSIS — J40 Bronchitis, not specified as acute or chronic: Secondary | ICD-10-CM | POA: Diagnosis not present

## 2020-07-12 DIAGNOSIS — Z20828 Contact with and (suspected) exposure to other viral communicable diseases: Secondary | ICD-10-CM | POA: Diagnosis not present

## 2020-07-12 DIAGNOSIS — G4733 Obstructive sleep apnea (adult) (pediatric): Secondary | ICD-10-CM | POA: Diagnosis not present

## 2020-07-12 DIAGNOSIS — J329 Chronic sinusitis, unspecified: Secondary | ICD-10-CM | POA: Diagnosis not present

## 2020-08-05 DIAGNOSIS — M7989 Other specified soft tissue disorders: Secondary | ICD-10-CM | POA: Diagnosis not present

## 2020-08-09 DIAGNOSIS — M7989 Other specified soft tissue disorders: Secondary | ICD-10-CM | POA: Diagnosis not present

## 2020-08-09 DIAGNOSIS — M79641 Pain in right hand: Secondary | ICD-10-CM | POA: Diagnosis not present

## 2020-08-09 DIAGNOSIS — Z0000 Encounter for general adult medical examination without abnormal findings: Secondary | ICD-10-CM | POA: Diagnosis not present

## 2020-08-22 DIAGNOSIS — H18513 Endothelial corneal dystrophy, bilateral: Secondary | ICD-10-CM | POA: Diagnosis not present

## 2020-08-22 DIAGNOSIS — H524 Presbyopia: Secondary | ICD-10-CM | POA: Diagnosis not present

## 2020-08-22 DIAGNOSIS — I10 Essential (primary) hypertension: Secondary | ICD-10-CM | POA: Insufficient documentation

## 2020-08-22 DIAGNOSIS — I350 Nonrheumatic aortic (valve) stenosis: Secondary | ICD-10-CM | POA: Insufficient documentation

## 2020-08-22 DIAGNOSIS — C50919 Malignant neoplasm of unspecified site of unspecified female breast: Secondary | ICD-10-CM | POA: Insufficient documentation

## 2020-08-22 DIAGNOSIS — M1990 Unspecified osteoarthritis, unspecified site: Secondary | ICD-10-CM | POA: Insufficient documentation

## 2020-08-22 DIAGNOSIS — I351 Nonrheumatic aortic (valve) insufficiency: Secondary | ICD-10-CM | POA: Insufficient documentation

## 2020-08-22 DIAGNOSIS — J45909 Unspecified asthma, uncomplicated: Secondary | ICD-10-CM | POA: Insufficient documentation

## 2020-08-22 DIAGNOSIS — K219 Gastro-esophageal reflux disease without esophagitis: Secondary | ICD-10-CM | POA: Insufficient documentation

## 2020-09-05 NOTE — Progress Notes (Signed)
Cardiology Office Note:    Date:  09/06/2020   ID:  Judith Solis, DOB [birth year], MRN [id number]  PCP:  Ronita Hipps, MD  Cardiologist:  Shirlee More, MD    Referring MD: Ronita Hipps, MD    ASSESSMENT:    1. S/P TAVR (transcatheter aortic valve replacement)   2. Left bundle branch block   3. Essential hypertension    PLAN:    In order of problems listed above:  1. Stable should've a repeat echocardiogram performed in April as part of her TAVR surveillance. Her gradients are mildly increased but stable and doesn't have evidence of valve dysfunction. Reduce aspirin 81 mg daily 2. New finding on EKG next visit 3. BP at target currently not on antihypertensive agent 4. Continue statin for hyperlipidemia   Next appointment: 1 year   Medication Adjustments/Labs and Tests Ordered: Current medicines are reviewed at length with the patient today.  Concerns regarding medicines are outlined above.  No orders of the defined types were placed in this encounter.  No orders of the defined types were placed in this encounter.   Chief Complaint  Patient presents with  . Follow-up    After surgical AVR    History of Present Illness:    Judith Solis is a 75 y.o. female with a hx of tension hyperlipidemia and TAVR for severe aortic stenosis 03/04/2018.  She was last seen 02/25/2020 preoperatively EKG showed left bundle branch block.. Compliance with diet, lifestyle and medications: Yes  Cardiac echo 10/29/2019 with a stable mildly increased gradient. Otherwise valve function was normal EF normal. She is sedentary but feels well no edema shortness of breath chest pain palpitation or syncope. I encouraged her to find time 15 to 20 minutes every day for walking and schedule herself. Recent labs 02/12/2020 cholesterol 175 LDL 95 HDL 37 triglycerides 137. Past Medical History:  Diagnosis Date  . Aortic valve regurgitation   . Arthritis   . Asthma   . Breast cancer ([place])    a.  s/p chemo and radiation  . GERD (gastroesophageal reflux disease)   . Hyperlipidemia   . Hypertension   . Morbid obesity ([place])   . 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 Severe aortic stenosis     Past Surgical History:  Procedure Laterality Date  . ABDOMINAL HYSTERECTOMY    . BELPHAROPTOSIS REPAIR Bilateral   . BREAST SURGERY     LUMPECTOMY WITH CHEMO & RADIATION  . CATARACT EXTRACTION    . EYE SURGERY    . INTRAOPERATIVE TRANSTHORACIC ECHOCARDIOGRAM  03/04/2018   Procedure: INTRAOPERATIVE TRANSTHORACIC ECHOCARDIOGRAM;  Surgeon: Sherren Mocha, MD;  Location: [facility] OR;  Service: Open Heart Surgery;;  . JOINT REPLACEMENT     BILATERAL HIPS 2007 & 2008  . RIGHT/LEFT HEART CATH AND CORONARY ANGIOGRAPHY N/A 01/31/2018   Procedure: RIGHT/LEFT HEART CATH AND CORONARY ANGIOGRAPHY;  Surgeon: Burnell Blanks, MD;  Location: [facility];  Service: Cardiovascular;  Laterality: N/A;  . ROTATOR CUFF REPAIR    . TONSILLECTOMY    . TRANSCATHETER AORTIC VALVE REPLACEMENT, TRANSFEMORAL N/A 03/04/2018   Procedure: TRANSCATHETER AORTIC VALVE REPLACEMENT, TRANSFEMORAL. EDWARDS 26MM SAPIEN 3 TRANSCATHETER HEART VALVE.;  Surgeon: Sherren Mocha, MD;  Location: [place];  Service: Open Heart Surgery;  Laterality: N/A;    Current Medications: Current Meds  Medication Sig  . amoxicillin (AMOXIL) 500 MG capsule TAKE  FOUR CAPSULES BY MOUTH ONE HOUR BEFORE DENTAL APPOINTMENT  . aspirin 325 MG tablet Take 325 mg by mouth daily.  Marland Kitchen atorvastatin (LIPITOR) 10 MG tablet Take 10 mg by mouth at bedtime.   Marland Kitchen CALCIUM CITRATE PO Take 2,000 Units by mouth daily.  . Cholecalciferol (VITAMIN D3) 50 MCG (2000 UT) TABS Take 1 tablet by mouth daily.  . citalopram (CELEXA) 10 MG tablet Take 10 mg by mouth daily.  . clobetasol cream (TEMOVATE) 8.11 % Apply 1 application topically 2 (two)  times daily as needed (vaginal irritation).  . magnesium gluconate (MAGONATE) 500 MG tablet Take 500 mg by mouth at bedtime.  . memantine (NAMENDA) 10 MG tablet Take 10 mg by mouth 2 (two) times daily.  . Multiple Vitamin (MULTIVITAMIN) tablet Take 1 tablet by mouth daily.  . pantoprazole (PROTONIX) 40 MG tablet Take 40 mg by mouth daily.  . pramipexole (MIRAPEX) 0.125 MG tablet Take 0.125 mg by mouth every night at bedtime  . Probiotic Product (PROBIOTIC-10 PO) Take 1 tablet by mouth at bedtime.  . traZODone (DESYREL) 100 MG tablet Take 100 mg by mouth at bedtime. 0.5 TABLET NIGHTLY  . VENTOLIN HFA 108 (90 Base) MCG/ACT inhaler Inhale 1 puff into the lungs every 6 (six) hours as needed for wheezing or shortness of breath.      Allergies:   Plavix [clopidogrel bisulfate], Codeine, and Oxycodone-acetaminophen   Social History   Socioeconomic History  . Marital status: Widowed    Spouse name: Not on file  . Number of children: Not on file  . Years of education: Not on file  . Highest education level: Not on file  Occupational History  . Not on file  Tobacco Use  . Smoking status: Former Smoker    Packs/day: 1.50    Years: 20.00    Pack years: 30.00    Types: Cigarettes    Quit date: 06/04/1990    Years since quitting: 30.2  . Smokeless tobacco: Never Used  Vaping Use  . Vaping Use: Never used  Substance and Sexual Activity  . Alcohol use: No  . Drug use: No  . Sexual activity: Not on file  Other Topics Concern  . Not on file  Social History Narrative  . Not on file   Social Determinants of Health   Financial Resource Strain: Not on file  Food Insecurity: Not on file  Transportation Needs: Not on file  Physical Activity: Not on file  Stress: Not on file  Social Connections: Not on file     Family History: The patient's family history includes Diabetes Mellitus II in her paternal grandfather, sister, and sister; Heart Problems in her brother and sister; Heart disease  in her brother, maternal grandfather, and mother; Hepatitis C in her sister; Lung cancer in her sister; Stroke in her father; Throat cancer in her sister. ROS:   Please see the history of present illness.    All other systems reviewed and are negative.  EKGs/Labs/Other Studies Reviewed:    The following studies were reviewed today:  Recent Lipid Panel    Component Value Date/Time   CHOL 165 05/22/2019 1445   TRIG 221 (H) 05/22/2019 1445   HDL 32 (L) 05/22/2019 1445   CHOLHDL 5.2 (H) 05/22/2019 1445   LDLCALC 95 05/22/2019 1445    Physical Exam:    VS:  BP 122/76   Pulse 80   Ht 5\' 5"  (1.651 m)   Wt 216 lb 6.4 oz (98.2 kg)  SpO2 95%   BMI 36.01 kg/m     Wt Readings from Last 3 Encounters:  09/06/20 216 lb 6.4 oz (98.2 kg)  02/25/20 203 lb (92.1 kg)  05/22/19 204 lb 6.4 oz (92.7 kg)     GEN:  Well nourished, well developed in no acute distress HEENT: Normal NECK: No JVD; No carotid bruits LYMPHATICS: No lymphadenopathy CARDIAC: RRR, no murmurs, rubs, gallops RESPIRATORY:  Clear to auscultation without rales, wheezing or rhonchi  ABDOMEN: Soft, non-tender, non-distended MUSCULOSKELETAL:  No edema; No deformity  SKIN: Warm and dry NEUROLOGIC:  Alert and oriented x 3 PSYCHIATRIC:  Normal affect    Signed, Shirlee More, MD  09/06/2020 9:22 AM    [facility]

## 2020-09-06 ENCOUNTER — Other Ambulatory Visit: Payer: Self-pay

## 2020-09-06 ENCOUNTER — Ambulatory Visit (INDEPENDENT_AMBULATORY_CARE_PROVIDER_SITE_OTHER): Payer: Medicare Other | Admitting: Cardiology

## 2020-09-06 ENCOUNTER — Telehealth: Payer: Self-pay

## 2020-09-06 ENCOUNTER — Encounter: Payer: Self-pay | Admitting: Cardiology

## 2020-09-06 VITALS — BP 122/76 | HR 80 | Ht 65.0 in | Wt 216.4 lb

## 2020-09-06 DIAGNOSIS — I447 Left bundle-branch block, unspecified: Secondary | ICD-10-CM

## 2020-09-06 DIAGNOSIS — I10 Essential (primary) hypertension: Secondary | ICD-10-CM | POA: Diagnosis not present

## 2020-09-06 DIAGNOSIS — Z952 Presence of prosthetic heart valve: Secondary | ICD-10-CM | POA: Diagnosis not present

## 2020-09-06 LAB — LIPID PANEL
Chol/HDL Ratio: 4.7 ratio — ABNORMAL HIGH (ref 0.0–4.4)
Cholesterol, Total: 165 mg/dL (ref 100–199)
HDL: 35 mg/dL — ABNORMAL LOW (ref >39)
LDL Chol Calc (NIH): 99 mg/dL (ref 0–99)
Triglycerides: 176 mg/dL — ABNORMAL HIGH (ref 0–149)
VLDL Cholesterol Cal: 31 mg/dL (ref 5–40)

## 2020-09-06 LAB — COMPREHENSIVE METABOLIC PANEL
ALT: 28 IU/L (ref 0–32)
AST: 19 IU/L (ref 0–40)
Albumin/Globulin Ratio: 1.6 (ref 1.2–2.2)
Albumin: 4.2 g/dL (ref 3.7–4.7)
Alkaline Phosphatase: 85 IU/L (ref 44–121)
BUN/Creatinine Ratio: 25 (ref 12–28)
BUN: 24 mg/dL (ref 8–27)
Bilirubin Total: 0.4 mg/dL (ref 0.0–1.2)
CO2: 24 mmol/L (ref 20–29)
Calcium: 9.3 mg/dL (ref 8.7–10.3)
Chloride: 101 mmol/L (ref 96–106)
Creatinine, Ser: 0.95 mg/dL (ref 0.57–1.00)
GFR calc Af Amer: 67 mL/min/{1.73_m2} (ref >59)
GFR calc non Af Amer: 58 mL/min/{1.73_m2} — ABNORMAL LOW (ref >59)
Globulin, Total: 2.6 g/dL (ref 1.5–4.5)
Glucose: 117 mg/dL — ABNORMAL HIGH (ref 65–99)
Potassium: 4.8 mmol/L (ref 3.5–5.2)
Sodium: 138 mmol/L (ref 134–144)
Total Protein: 6.8 g/dL (ref 6.0–8.5)

## 2020-09-06 MED ORDER — ASPIRIN EC 81 MG PO TBEC: 81.0000 mg | DELAYED_RELEASE_TABLET | Freq: Every day | ORAL | 3 refills | Status: AC

## 2020-09-06 NOTE — Addendum Note (Signed)
Addended by: Resa Miner I on: 09/06/2020 09:28 AM   Modules accepted: Orders

## 2020-09-06 NOTE — Telephone Encounter (Signed)
-----   Message from Richardo Priest, MD sent at 09/06/2020  4:50 PM EST ----- Good result no changes

## 2020-09-06 NOTE — Patient Instructions (Signed)
Medication Instructions:  Your physician has recommended you make the following change in your medication:  DECREASE: Aspirin 81 mg take one tablet by mouth daily.  *If you need a refill on your cardiac medications before your next appointment, please call your pharmacy*   Lab Work: Your physician recommends that you return for lab work in: [place], Lipids If you have labs (blood work) drawn today and your tests are completely normal, you will receive your results only by: Marland Kitchen MyChart Message (if you have MyChart) OR . A paper copy in the mail If you have any lab test that is abnormal or we need to change your treatment, we will call you to review the results.   Testing/Procedures: Your physician has requested that you have an echocardiogram in April. Echocardiography is a painless test that uses sound waves to create images of your heart. It provides your doctor with information about the size and shape of your heart and how well your heart's chambers and valves are working. This procedure takes approximately one hour. There are no restrictions for this procedure.     Follow-Up: At [facility], you and your health needs are our priority.  As part of our continuing mission to provide you with exceptional heart care, we have created designated Provider Care Teams.  These Care Teams include your primary Cardiologist (physician) and Advanced Practice Providers (APPs -  Physician Assistants and Nurse Practitioners) who all work together to provide you with the care you need, when you need it.  We recommend signing up for the patient portal called "MyChart".  Sign up information is provided on this After Visit Summary.  MyChart is used to connect with patients for Virtual Visits (Telemedicine).  Patients are able to view lab/test results, encounter notes, upcoming appointments, etc.  Non-urgent messages can be sent to your provider as well.   To learn more about what you can do with MyChart,  go to [email or web address].    Your next appointment:   1 year(s)  The format for your next appointment:   In Person  Provider:   Shirlee More, MD   Other Instructions

## 2020-09-06 NOTE — Telephone Encounter (Signed)
Spoke with patient regarding results and recommendation.  Patient verbalizes understanding and is agreeable to plan of care. Advised patient to call back with any issues or concerns.  

## 2020-09-09 DIAGNOSIS — Z1231 Encounter for screening mammogram for malignant neoplasm of breast: Secondary | ICD-10-CM | POA: Diagnosis not present

## 2020-09-19 ENCOUNTER — Encounter: Payer: Self-pay | Admitting: Oncology

## 2020-09-22 NOTE — Progress Notes (Signed)
Judith Solis  [address] [place],  [place]  [address] [phone]  Clinic Day:  09/23/2020  Referring physician: Ronita Hipps, MD  This document serves as a record of services personally performed by Judith Poisson, MD. It was created on their behalf by Judith Solis, a trained medical scribe. The creation of this record is based on the scribe's personal observations and the provider's statements to them.  CHIEF COMPLAINT:  CC: History of stage IA hormone receptor positive left breast cancer  Current Treatment:  Surveillance  HISTORY OF PRESENT ILLNESS:  Judith Solis is a 75 y.o. female who we began seeing in August 2014 for follow-up of breast cancer, when she moved to the area from [place].  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  She was treated with lumpectomy.  Pathology revealed a 1.5 cm, grade 3, invasive ductal carcinoma with a negative nodes.  Estrogen and progesterone receptors were positive and her 2 Neu negative.  Oncotype DX score was 25, which is in an intermediate risk category.  She received adjuvant chemotherapy with Taxotere and Cytoxan for 3 months, followed by adjuvant radiation to the left breast, as well as Arimidex for 5 years completed in March 2015.  She is here for yearly routine follow-up and states she has been doing fairly well.  She denies any changes in her breasts.  Prior to her visit today she did undergo bilateral diagnostic mammogram.  She has had problems with severe bladder/urethral/vaginal pressure with urinary frequency.  She was seen by Dr. Matilde Solis and underwent cystoscopy, which was negative.  He felt her symptoms were most likely due to vaginal dryness.  Her symptoms did not improve with Vesicare.  In 2016, she saw Dr. Jimmie Solis for cardiac evaluation and was found to have congestive heart failure, as well as aortic stenosis.  She was  placed on losartan/hydrochlorothiazide 50/12.5 daily.  She had an echocardiogram at that time.  She had shoulder surgery in 06-24-2015.  She had osteopenia on bone density in March 2015.  Repeat bone density scan in 2017 revealed persistent osteopenia.  There was a 6% worsening of her spine, but a 3% improvement of her left forearm.  Her husband passed away in 06-23-17 after a prolonged illness.  She had her aortic valve [facility] in 2019 with a bovine valve, so she does not require full anticoagulation.    INTERVAL HISTORY:  Judith Solis is here for annual follow up and states that she has been doing well other than weight gain and abdominal bloating.  She attributes this to a nutritional supplement, so she has discontinued this, but her abdomen remains distended.  She recently followed up with Dr. Bettina Solis with a good report and will be scheduled with him again in 1 year.  Chemistries from his office were unremarkable.  Annual mammogram from February was clear.  She states that she has had two sisters who had breast cancer, and had to undergo bilateral mastectomies.  She also had a sister who died of lung cancer at age 87.  One of the sisters that had breast cancer now has advanced bladder cancer.  So far genetic testing has been negative on family members, but one is pending.  I have asked her to let me know when she receives the results and to bring a copy of the report if it is positive.  She is  past due for routine colonoscopy and so I will contact Dr. Lyda Solis.  Her  appetite is good, and she has gained 12 pounds since her last visit.  She denies fever, chills or other signs of infection.  She denies nausea, vomiting, bowel issues, or abdominal pain.  She denies sore throat, cough, dyspnea, or chest pain.  REVIEW OF SYSTEMS:  Review of Systems  Constitutional: Positive for unexpected weight change (weight gain). Negative for appetite change, chills, fatigue and fever.  HENT:  Negative.    Eyes: Negative.   Respiratory: Negative.  Negative for chest tightness, cough, hemoptysis, shortness of breath and wheezing.   Cardiovascular: Negative.  Negative for chest pain, leg swelling and palpitations.  Gastrointestinal: Positive for abdominal distention (and bloating). Negative for abdominal pain, blood in stool, constipation, diarrhea, nausea and vomiting.  Endocrine: Negative.   Genitourinary: Negative.  Negative for difficulty urinating, dysuria, frequency and hematuria.   Musculoskeletal: Negative.  Negative for arthralgias, back pain, flank pain, gait problem and myalgias.  Skin: Negative.   Neurological: Negative.  Negative for dizziness, extremity weakness, gait problem, headaches, light-headedness, numbness, seizures and speech difficulty.  Hematological: Negative.   Psychiatric/Behavioral: Negative.  Negative for depression and sleep disturbance. The patient is not nervous/anxious.      VITALS:  Blood pressure (!) 160/80, pulse 71, temperature 98 F (36.7 C), temperature source Oral, resp. rate 18, height 5\' 5"  (1.651 m), weight 218 lb 12.8 oz (99.2 kg), SpO2 96 %.  Wt Readings from Last 3 Encounters:  09/23/20 218 lb 12.8 oz (99.2 kg)  09/06/20 216 lb 6.4 oz (98.2 kg)  02/25/20 203 lb (92.1 kg)    Body mass index is 36.41 kg/m.  Performance status (ECOG): 1 - Symptomatic but completely ambulatory  PHYSICAL EXAM:  Physical Exam Constitutional:      General: She is not in acute distress.    Appearance: Normal appearance. She is normal weight.  HENT:     Head: Normocephalic and atraumatic.  Eyes:     General: No scleral icterus.    Extraocular Movements: Extraocular movements intact.     Conjunctiva/sclera: Conjunctivae normal.     Pupils: Pupils are equal, round, and reactive to light.  Cardiovascular:     Rate and Rhythm: Normal rate and regular rhythm.     Pulses: Normal pulses.     Heart sounds: Murmur heard.   Systolic murmur is present with a grade  of 2/6. No friction rub. No gallop.   Pulmonary:     Effort: Pulmonary effort is normal. No respiratory distress.     Breath sounds: Normal breath sounds.  Chest:     Comments: Bilateral breasts are without masses. Abdominal:     General: Bowel sounds are normal. There is no distension.     Palpations: Abdomen is soft. There is no hepatomegaly, splenomegaly or mass.     Tenderness: There is no abdominal tenderness.  Musculoskeletal:        General: Normal range of motion.     Cervical back: Normal range of motion and neck supple.     Right lower leg: No edema.     Left lower leg: No edema.  Lymphadenopathy:     Cervical: No cervical adenopathy.  Skin:    General: Skin is warm and dry.  Neurological:     General: No focal deficit present.     Mental Status: She is alert and oriented to person, place, and time. Mental status is at baseline.  Psychiatric:  Mood and Affect: Mood normal.        Behavior: Behavior normal.        Thought Content: Thought content normal.        Judgment: Judgment normal.     LABS:   CBC Latest Ref Rng & Units 03/13/2019 03/06/2018 03/05/2018  WBC 3.4 - 10.8 x10E3/uL 6.0 6.1 6.2  Hemoglobin 11.1 - 15.9 g/dL 12.9 11.6(L) 12.1  Hematocrit 34.0 - 46.6 % 37.3 35.8(L) 37.3  Platelets 150 - 450 x10E3/uL 215 171 197   CMP Latest Ref Rng & Units 09/06/2020 05/22/2019 03/06/2018  Glucose 65 - 99 mg/dL 117(H) 81 119(H)  BUN 8 - 27 mg/dL 24 21 15   Creatinine 0.57 - 1.00 mg/dL 0.95 0.97 0.91  Sodium 134 - 144 mmol/L 138 140 138  Potassium 3.5 - 5.2 mmol/L 4.8 4.6 4.5  Chloride 96 - 106 mmol/L 101 103 102  CO2 20 - 29 mmol/L 24 26 29   Calcium 8.7 - 10.3 mg/dL 9.3 9.2 8.8(L)  Total Protein 6.0 - 8.5 g/dL 6.8 6.8 -  Total Bilirubin 0.0 - 1.2 mg/dL 0.4 0.4 -  Alkaline Phos 44 - 121 IU/L 85 82 -  AST 0 - 40 IU/L 19 24 -  ALT 0 - 32 IU/L 28 29 -    STUDIES:   She underwent screening bilateral mammogram with tomography on 09/09/2020 showing: breast  density category B.  No mammographic evidence of malignancy.  Allergies:  Allergies  Allergen Reactions  . Plavix [Clopidogrel Bisulfate] Itching  . Codeine Nausea And Vomiting  . Oxycodone-Acetaminophen Other (See Comments)    Hallucinations    Current Medications: Current Outpatient Medications  Medication Sig Dispense Refill  . aspirin EC 81 MG tablet Take 1 tablet (81 mg total) by mouth daily. Swallow whole. 90 tablet 3  . atorvastatin (LIPITOR) 10 MG tablet Take 10 mg by mouth at bedtime.     Marland Kitchen CALCIUM CITRATE PO Take 2,000 Units by mouth daily.    . Cholecalciferol (VITAMIN D3) 50 MCG (2000 UT) TABS Take 1 tablet by mouth daily.    . citalopram (CELEXA) 10 MG tablet Take 10 mg by mouth daily.    . clobetasol cream (TEMOVATE) 5.17 % Apply 1 application topically 2 (two) times daily as needed (vaginal irritation).    . gabapentin (NEURONTIN) 300 MG capsule     . magnesium gluconate (MAGONATE) 500 MG tablet Take 500 mg by mouth at bedtime.    . memantine (NAMENDA) 10 MG tablet Take 10 mg by mouth 2 (two) times daily.    . Multiple Vitamin (MULTIVITAMIN) tablet Take 1 tablet by mouth daily.    . pantoprazole (PROTONIX) 40 MG tablet Take 40 mg by mouth daily.    . pramipexole (MIRAPEX) 0.125 MG tablet Take 0.125 mg by mouth every night at bedtime    . Probiotic Product (PROBIOTIC-10 PO) Take 1 tablet by mouth at bedtime.    . traZODone (DESYREL) 100 MG tablet Take 100 mg by mouth at bedtime. 0.5 TABLET NIGHTLY     No current facility-administered medications for this visit.     ASSESSMENT & PLAN:   Assessment:   1.  Remote history of stage IA hormone receptor positive breast cancer, diagnosed in July 2009.  She remains without evidence of recurrence.    2.  Weight gain and abdominal distention, with complaints of abdominal bloating, all of new onset, which is concerning.  She does have a significant family history of malignancy, and I don't want  to miss an underlying  malignancy.  For completeness, I will obtain CT abdomen and pelvis.  Plan: With her significant weight gain and abdominal distention, I will order CT abdomen and pelvis for further evaluation.  If all is well, we will plan to see her back in 1 year with a bilateral screening mammogram.  I will refer her to Dr. Lyda Solis for repeat colonoscopy since her last one was in 2012.  The patient understands the plans discussed today and is in agreement with them.  She knows to contact our office if she develops concerns regarding her breast cancer.   I provided 15 minutes of face-to-face time during this this encounter and > 50% was spent counseling as documented under my assessment and plan.    Derwood Kaplan, MD [facility] AT [facility] [address] [place] [address] Dept: [phone] Dept Fax: [phone]   I, Rita Ohara, am acting as scribe for Derwood Kaplan, MD  I have reviewed this report as typed by the medical scribe, and it is complete and accurate.  Hermina Barters

## 2020-09-23 ENCOUNTER — Other Ambulatory Visit: Payer: Self-pay

## 2020-09-23 ENCOUNTER — Inpatient Hospital Stay: Payer: Medicare Other | Attending: Oncology | Admitting: Oncology

## 2020-09-23 ENCOUNTER — Telehealth: Payer: Self-pay | Admitting: Oncology

## 2020-09-23 ENCOUNTER — Encounter: Payer: Self-pay | Admitting: Oncology

## 2020-09-23 ENCOUNTER — Other Ambulatory Visit: Payer: Self-pay | Admitting: Oncology

## 2020-09-23 VITALS — BP 160/80 | HR 71 | Temp 98.0°F | Resp 18 | Ht 65.0 in | Wt 218.8 lb

## 2020-09-23 DIAGNOSIS — Z170 Estrogen receptor positive status [ER+]: Secondary | ICD-10-CM

## 2020-09-23 DIAGNOSIS — R1907 Generalized intra-abdominal and pelvic swelling, mass and lump: Secondary | ICD-10-CM

## 2020-09-23 DIAGNOSIS — M8580 Other specified disorders of bone density and structure, unspecified site: Secondary | ICD-10-CM

## 2020-09-23 DIAGNOSIS — Z780 Asymptomatic menopausal state: Secondary | ICD-10-CM

## 2020-09-23 DIAGNOSIS — R140 Abdominal distension (gaseous): Secondary | ICD-10-CM

## 2020-09-23 DIAGNOSIS — C50312 Malignant neoplasm of lower-inner quadrant of left female breast: Secondary | ICD-10-CM

## 2020-09-23 NOTE — Telephone Encounter (Signed)
Per 3/4 los next appt sched and given to patient 

## 2020-09-26 ENCOUNTER — Encounter: Payer: Self-pay | Admitting: Oncology

## 2020-09-26 DIAGNOSIS — L814 Other melanin hyperpigmentation: Secondary | ICD-10-CM | POA: Diagnosis not present

## 2020-09-26 DIAGNOSIS — L570 Actinic keratosis: Secondary | ICD-10-CM | POA: Diagnosis not present

## 2020-09-26 DIAGNOSIS — L820 Inflamed seborrheic keratosis: Secondary | ICD-10-CM | POA: Diagnosis not present

## 2020-09-26 DIAGNOSIS — L578 Other skin changes due to chronic exposure to nonionizing radiation: Secondary | ICD-10-CM | POA: Diagnosis not present

## 2020-09-27 DIAGNOSIS — R635 Abnormal weight gain: Secondary | ICD-10-CM | POA: Diagnosis not present

## 2020-09-27 DIAGNOSIS — R1907 Generalized intra-abdominal and pelvic swelling, mass and lump: Secondary | ICD-10-CM | POA: Diagnosis not present

## 2020-09-27 DIAGNOSIS — C50919 Malignant neoplasm of unspecified site of unspecified female breast: Secondary | ICD-10-CM | POA: Diagnosis not present

## 2020-09-27 DIAGNOSIS — R140 Abdominal distension (gaseous): Secondary | ICD-10-CM | POA: Diagnosis not present

## 2020-09-27 DIAGNOSIS — Z853 Personal history of malignant neoplasm of breast: Secondary | ICD-10-CM | POA: Diagnosis not present

## 2020-09-27 DIAGNOSIS — N281 Cyst of kidney, acquired: Secondary | ICD-10-CM | POA: Diagnosis not present

## 2020-09-29 ENCOUNTER — Telehealth: Payer: Self-pay

## 2020-09-29 NOTE — Telephone Encounter (Signed)
-----   Message from Derwood Kaplan, MD sent at 09/28/2020  1:39 PM EST ----- Regarding: call pt Tell her CT scan looks good.  She has fatty liver, needs to watch BS, cholesterol, weight. But otherwise all is clear.  She has a small nodule in the left adrenal gland but unchanged, has been There.  We will send copy to Dr. Helene Kelp.

## 2020-10-03 ENCOUNTER — Telehealth: Payer: Self-pay

## 2020-10-03 NOTE — Telephone Encounter (Signed)
Faxed referral to Dr. Misenheimer's office. 

## 2020-10-03 NOTE — Telephone Encounter (Signed)
Patient notified of this message and previous message

## 2020-10-03 NOTE — Telephone Encounter (Signed)
-----   Message from Derwood Kaplan, MD sent at 09/30/2020  6:19 PM EST ----- Regarding: referral Did we refer Dr. Lyda Jester?  She is due for colonscopy and is having some abd complaints. CT was neg., send him a copy.

## 2020-10-03 NOTE — Telephone Encounter (Signed)
-----   Message from Derwood Kaplan, MD sent at 09/30/2020  6:25 PM EST ----- Regarding: call pt Tell her CT is normal other than fatty liver (I think I already told you this but can't find it)

## 2020-10-04 DIAGNOSIS — M4316 Spondylolisthesis, lumbar region: Secondary | ICD-10-CM | POA: Diagnosis not present

## 2020-10-04 DIAGNOSIS — R200 Anesthesia of skin: Secondary | ICD-10-CM | POA: Diagnosis not present

## 2020-10-04 DIAGNOSIS — Z981 Arthrodesis status: Secondary | ICD-10-CM | POA: Diagnosis not present

## 2020-10-12 ENCOUNTER — Telehealth: Payer: Self-pay

## 2020-10-12 NOTE — Telephone Encounter (Signed)
-----   Message from Derwood Kaplan, MD sent at 10/10/2020  6:16 PM EDT ----- Regarding: referral I can't recall, did we refer her to Dr. Lyda Jester?

## 2020-10-12 NOTE — Telephone Encounter (Signed)
Faxed referral on 10/03/20 via fax.

## 2020-11-01 ENCOUNTER — Other Ambulatory Visit: Payer: Medicare Other

## 2020-11-08 ENCOUNTER — Ambulatory Visit (INDEPENDENT_AMBULATORY_CARE_PROVIDER_SITE_OTHER): Payer: Medicare Other

## 2020-11-08 ENCOUNTER — Telehealth: Payer: Self-pay

## 2020-11-08 ENCOUNTER — Other Ambulatory Visit: Payer: Self-pay

## 2020-11-08 DIAGNOSIS — I10 Essential (primary) hypertension: Secondary | ICD-10-CM | POA: Diagnosis not present

## 2020-11-08 DIAGNOSIS — Z952 Presence of prosthetic heart valve: Secondary | ICD-10-CM

## 2020-11-08 DIAGNOSIS — I447 Left bundle-branch block, unspecified: Secondary | ICD-10-CM | POA: Diagnosis not present

## 2020-11-08 LAB — ECHOCARDIOGRAM COMPLETE
AR max vel: 1.23 cm2
AV Area VTI: 1.15 cm2
AV Area mean vel: 1.23 cm2
AV Mean grad: 14 mmHg
AV Peak grad: 25.5 mmHg
Ao pk vel: 2.53 m/s
Area-P 1/2: 2.99 cm2
Calc EF: 40.5 %
S' Lateral: 3.5 cm
Single Plane A2C EF: 43.4 %
Single Plane A4C EF: 35.8 %

## 2020-11-08 MED ORDER — ENTRESTO 24-26 MG PO TABS: 1.0000 | ORAL_TABLET | Freq: Two times a day (BID) | ORAL | 3 refills | Status: DC

## 2020-11-08 NOTE — Telephone Encounter (Signed)
Spoke with patient regarding results and recommendation.  Patient verbalizes understanding and is agreeable to plan of care. Advised patient to call back with any issues or concerns.   Patients medication list is up to date and she is not taking any ACE inhibitors or ARB at this time.

## 2020-11-08 NOTE — Telephone Encounter (Signed)
-----   Message from Richardo Priest, MD sent at 11/08/2020  2:01 PM EDT ----- Valve function is normal  Her cardiac function is mildly diminished compared to baseline I would like to start her on Entresto 24-26 twice daily trend blood pressures at home 1 week have a BMP done and bring a copy of her blood pressures to the office with her.  Tensile her meds with her and be sure she is not taking any ACE inhibitor or an ARB.

## 2020-11-08 NOTE — Progress Notes (Signed)
Complete echocardiogram performed.  Jimmy Gunnison Chahal RDCS, RVT  

## 2020-11-14 ENCOUNTER — Telehealth: Payer: Self-pay | Admitting: Cardiology

## 2020-11-14 NOTE — Telephone Encounter (Signed)
Pt c/o medication issue:  1. Name of Medication: sacubitril-valsartan (ENTRESTO) 24-26 MG  2. How are you currently taking this medication (dosage and times per day)? Not currently taking medication   3. Are you having a reaction (difficulty breathing--STAT)? No   4. What is your medication issue? Breiona is calling stating that [place] called her stating Delene Loll would cost $500 and she can not afford this. She would like to discuss and alternative medication or how a discount could be arranged. Paris is leaving for vacation and won't be back until 11/25/20. She states she can still be reached at her cell, but if she does not answer she can be reached at her sisters number by tomorrow when she arrives. She states her sister's name is Juluis Rainier and her number is [phone]. Please advise.

## 2020-11-14 NOTE — Telephone Encounter (Signed)
Advised pt on pt assistance forms but states she will be on vacation for 2 weeks and upon her return she will call the office or download the forms for pt assistance.

## 2020-11-28 MED ORDER — VALSARTAN 40 MG PO TABS: 40.0000 mg | ORAL_TABLET | Freq: Two times a day (BID) | ORAL | 3 refills | Status: DC

## 2020-11-28 NOTE — Telephone Encounter (Signed)
I feel strongly she should be on Entresto, continue valsartan 80 mg twice daily.

## 2020-11-28 NOTE — Telephone Encounter (Signed)
    Pt is calling back to follow up, she said to call her back on her home #

## 2020-11-28 NOTE — Telephone Encounter (Signed)
Spoke to the patient just now and let her know that Dr. Bettina Gavia would like for her to do the patient assistance program and in the mean time start on valsartan. Dr. Bettina Gavia gave me a verbal order to start her on valsartan 40 mg BID. She verbalizes understanding of this and states that she is going to print the patient assistance forms and fill them out. She will bring them to our office once complete so that we can do our part.    Encouraged patient to call back with any questions or concerns.

## 2020-11-28 NOTE — Addendum Note (Signed)
Addended by: Resa Miner I on: 11/28/2020 08:57 AM   Modules accepted: Orders

## 2020-11-30 DIAGNOSIS — K219 Gastro-esophageal reflux disease without esophagitis: Secondary | ICD-10-CM | POA: Diagnosis not present

## 2020-12-05 DIAGNOSIS — M25561 Pain in right knee: Secondary | ICD-10-CM | POA: Diagnosis not present

## 2020-12-05 DIAGNOSIS — M1711 Unilateral primary osteoarthritis, right knee: Secondary | ICD-10-CM | POA: Diagnosis not present

## 2020-12-09 ENCOUNTER — Telehealth: Payer: Self-pay | Admitting: Cardiology

## 2020-12-09 DIAGNOSIS — Z79899 Other long term (current) drug therapy: Secondary | ICD-10-CM

## 2020-12-09 NOTE — Telephone Encounter (Signed)
New Message:      Pt says she have been taking Valsartan for about 10 days and been keeping a log of her blood pressure. She wants to know if she needs to come in for lab work?

## 2020-12-09 NOTE — Telephone Encounter (Signed)
Left message for patient to return call.

## 2020-12-09 NOTE — Telephone Encounter (Signed)
Called and spoke to patient. She never started entresto due to pricing. She has filled out patient assistance paperwork and waiting to hear back. In the mean time Dr. Bettina Gavia has her on valsartan. She as taken for 10 days. She wants to know if she should have her lab work drawn now like she was going to after being on entresto for 1 week? Will check with Dr. Bettina Gavia.

## 2020-12-12 NOTE — Telephone Encounter (Signed)
Patient called back. Informed her of Dr. Joya Gaskins recommendation to have labs drawn she is aware.

## 2020-12-13 ENCOUNTER — Other Ambulatory Visit: Payer: Self-pay

## 2020-12-13 DIAGNOSIS — I10 Essential (primary) hypertension: Secondary | ICD-10-CM | POA: Diagnosis not present

## 2020-12-13 DIAGNOSIS — R0602 Shortness of breath: Secondary | ICD-10-CM

## 2020-12-13 DIAGNOSIS — I350 Nonrheumatic aortic (valve) stenosis: Secondary | ICD-10-CM | POA: Diagnosis not present

## 2020-12-13 NOTE — Progress Notes (Signed)
PRO

## 2020-12-14 ENCOUNTER — Telehealth: Payer: Self-pay

## 2020-12-14 LAB — PRO B NATRIURETIC PEPTIDE: NT-Pro BNP: 116 pg/mL (ref 0–738)

## 2020-12-14 NOTE — Telephone Encounter (Signed)
Spoke with patient regarding results and recommendation.  Patient verbalizes understanding and is agreeable to plan of care. Advised patient to call back with any issues or concerns.  

## 2020-12-14 NOTE — Telephone Encounter (Signed)
-----   Message from Richardo Priest, MD sent at 12/14/2020  7:55 AM EDT ----- Good result proBNP level no change in treatment

## 2020-12-15 DIAGNOSIS — Z6834 Body mass index (BMI) 34.0-34.9, adult: Secondary | ICD-10-CM | POA: Diagnosis not present

## 2020-12-15 DIAGNOSIS — R300 Dysuria: Secondary | ICD-10-CM | POA: Diagnosis not present

## 2020-12-27 DIAGNOSIS — Z09 Encounter for follow-up examination after completed treatment for conditions other than malignant neoplasm: Secondary | ICD-10-CM | POA: Diagnosis not present

## 2020-12-27 DIAGNOSIS — Z1211 Encounter for screening for malignant neoplasm of colon: Secondary | ICD-10-CM | POA: Diagnosis not present

## 2020-12-27 DIAGNOSIS — Z86010 Personal history of colonic polyps: Secondary | ICD-10-CM | POA: Diagnosis not present

## 2020-12-27 DIAGNOSIS — Z954 Presence of other heart-valve replacement: Secondary | ICD-10-CM | POA: Diagnosis not present

## 2020-12-27 DIAGNOSIS — Z952 Presence of prosthetic heart valve: Secondary | ICD-10-CM | POA: Diagnosis not present

## 2020-12-27 DIAGNOSIS — K5730 Diverticulosis of large intestine without perforation or abscess without bleeding: Secondary | ICD-10-CM | POA: Diagnosis not present

## 2020-12-27 DIAGNOSIS — K6289 Other specified diseases of anus and rectum: Secondary | ICD-10-CM | POA: Diagnosis not present

## 2020-12-27 DIAGNOSIS — Z7902 Long term (current) use of antithrombotics/antiplatelets: Secondary | ICD-10-CM | POA: Diagnosis not present

## 2021-01-03 DIAGNOSIS — M67432 Ganglion, left wrist: Secondary | ICD-10-CM | POA: Diagnosis not present

## 2021-01-10 ENCOUNTER — Encounter: Payer: Self-pay | Admitting: Genetic Counselor

## 2021-01-10 ENCOUNTER — Other Ambulatory Visit: Payer: Self-pay | Admitting: Genetic Counselor

## 2021-01-10 ENCOUNTER — Inpatient Hospital Stay: Payer: Medicare Other | Attending: Oncology | Admitting: Genetic Counselor

## 2021-01-10 ENCOUNTER — Inpatient Hospital Stay: Payer: Medicare Other

## 2021-01-10 DIAGNOSIS — C50919 Malignant neoplasm of unspecified site of unspecified female breast: Secondary | ICD-10-CM | POA: Diagnosis not present

## 2021-01-10 DIAGNOSIS — Z853 Personal history of malignant neoplasm of breast: Secondary | ICD-10-CM | POA: Diagnosis not present

## 2021-01-10 DIAGNOSIS — C50312 Malignant neoplasm of lower-inner quadrant of left female breast: Secondary | ICD-10-CM

## 2021-01-10 DIAGNOSIS — Z170 Estrogen receptor positive status [ER+]: Secondary | ICD-10-CM

## 2021-01-10 DIAGNOSIS — Z803 Family history of malignant neoplasm of breast: Secondary | ICD-10-CM

## 2021-01-10 HISTORY — DX: Family history of malignant neoplasm of breast: Z80.3

## 2021-01-10 NOTE — Progress Notes (Signed)
REFERRING PROVIDER: Derwood Kaplan, MD [address],  [place] [address]  PRIMARY PROVIDER:  Ronita Hipps, MD  PRIMARY REASON FOR VISIT:  Encounter Diagnoses  Name Primary?   Malignant neoplasm of lower-inner quadrant of left breast in female, estrogen receptor positive ([place]) Yes   Family history of breast cancer    I connected with Judith Solis on 01/10/2021 at 11am EDT by [facility] video conference and verified that I am speaking with the correct person using two identifiers.   Patient location: [facility] Provider location: [facility]    HISTORY OF PRESENT ILLNESS:   Judith Solis, a 75 y.o. female, was seen for a [facility] cancer genetics consultation at the request of Dr. Hinton Rao due to a personal and family history of cancer.  Judith Solis presents to clinic today to discuss the possibility of a hereditary predisposition to cancer, to discuss genetic testing, and to further clarify her future cancer risks, as well as potential cancer risks for family members.   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 The treatment plan included lumpectomy, adjuvant chemotherapy, adjuvant radiation, and Amrimidex for 5 years.    CANCER HISTORY:  Oncology History  Breast cancer ([place])  01/29/2008 Cancer Staging   Staging form: Breast, AJCC 8th Edition - Clinical stage from 01/29/2008: Stage IA (cT1c, cN0(sn), cM0, G3, ER+, PR+, HER2-) - Signed by Derwood Kaplan, MD on 09/22/2020  Histopathologic type: Infiltrating duct carcinoma, NOS  Stage prefix: Initial diagnosis  Method of lymph node assessment: Sentinel lymph node biopsy  Nuclear grade: G3  Multigene prognostic tests performed: Oncotype DX  Menopausal status: Postmenopausal  Circulating tumor cells (CTC): Not assessed  Prognostic indicators: Oncotype DX 25, intermediate 1.5 cm Rx with TC x 4, radiation and anastrazole x 5 years Stage used in treatment  planning: Yes  National guidelines used in treatment planning: Yes  Type of national guideline used in treatment planning: NCCN    08/22/2020 Initial Diagnosis   Breast cancer ([place])      RISK FACTORS:  Menarche was at age 15.  First live birth at age 65.  OCP use for approximately 0 years.  Ovaries intact: unknown.  Hysterectomy: yes in 1977 Menopausal status: postmenopausal.  HRT use:  ~18  years. Colonoscopy: yes;  most recent approx 2 weeks ago; no polyps detected per patient . Mammogram within the last year: yes. Number of breast biopsies: 1. Any excessive radiation exposure in the past: no  Past Medical History:  Diagnosis Date   Aortic valve regurgitation    Arthritis    Asthma    Breast cancer ([place])    a. 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   26 mm Edwards Sapien 3 transcatheter heart valve placed via percutaneous right transfemoral approach    Severe aortic stenosis     Past Surgical History:  Procedure Laterality Date   ABDOMINAL HYSTERECTOMY     BELPHAROPTOSIS REPAIR Bilateral    BREAST SURGERY     LUMPECTOMY WITH CHEMO & RADIATION   CATARACT EXTRACTION     EYE SURGERY     INTRAOPERATIVE TRANSTHORACIC ECHOCARDIOGRAM  03/04/2018   Procedure: INTRAOPERATIVE TRANSTHORACIC ECHOCARDIOGRAM;  Surgeon: Sherren Mocha, MD;  Location: [place];  Service: Open Heart Surgery;;   JOINT  REPLACEMENT     BILATERAL HIPS 2007 & 2008   RIGHT/LEFT HEART CATH AND CORONARY ANGIOGRAPHY N/A 01/31/2018   Procedure: RIGHT/LEFT HEART CATH AND CORONARY ANGIOGRAPHY;  Surgeon: Burnell Blanks, MD;  Location: [place] [facility];  Service: Cardiovascular;  Laterality: N/A;   ROTATOR CUFF REPAIR     TONSILLECTOMY     TRANSCATHETER AORTIC VALVE REPLACEMENT, TRANSFEMORAL N/A 03/04/2018   Procedure: TRANSCATHETER AORTIC VALVE  REPLACEMENT, TRANSFEMORAL. EDWARDS 26MM SAPIEN 3 TRANSCATHETER HEART VALVE.;  Surgeon: Sherren Mocha, MD;  Location: [facility];  Service: Open Heart Surgery;  Laterality: N/A;    Social History   Socioeconomic History   Marital status: Widowed    Spouse name: Not on file   Number of children: Not on file   Years of education: Not on file   Highest education level: Not on file  Occupational History   Not on file  Tobacco Use   Smoking status: Former    Packs/day: 1.50    Years: 20.00    Pack years: 30.00    Types: Cigarettes    Quit date: 06/04/1990    Years since quitting: 30.6   Smokeless tobacco: Never  Vaping Use   Vaping Use: Never used  Substance and Sexual Activity   Alcohol use: No   Drug use: No   Sexual activity: Not on file  Other Topics Concern   Not on file  Social History Narrative   Not on file   Social Determinants of Health   Financial Resource Strain: Not on file  Food Insecurity: Not on file  Transportation Needs: Not on file  Physical Activity: Not on file  Stress: Not on file  Social Connections: Not on file     FAMILY HISTORY:  We obtained a detailed, 4-generation family history.  Significant diagnoses are listed below: Family History  Problem Relation Age of Onset   Lung cancer Sister 70       smoking hx   Breast cancer Sister 60   Breast cancer Sister 13   Bladder Cancer Sister 32   Kidney cancer Niece        dx 75s   Cancer Niece        unknown "female" cancer; ? cervical; dx 44s   Breast cancer Cousin        paternal female cousins x2, dx after 67   Breast cancer Cousin        paternal female cousins, unknown #   Lung cancer Cousin        paternal female cousin     Judith Solis is unaware of previous family history of genetic testing for hereditary cancer risks besides that mentioned above. There is no reported Ashkenazi Jewish ancestry. There is no known consanguinity.  GENETIC COUNSELING ASSESSMENT: Judith Solis is a 75 y.o. female  with a personal and family history of cancer which is somewhat suggestive of a hereditary cancer syndrome and predisposition to cancer given the presence of multiple breast cancers, including female breast cancers in the family. We, therefore, discussed and recommended the following at today's visit.   DISCUSSION: We discussed that 5 - 10% of cancer is hereditary, with most cases of hereditary breast cancer associated with mutations in BRCA1/2.  There are other genes that can be associated with hereditary breast cancer syndromes.  Type of cancer risk and level of risk are gene-specific.  We discussed that testing is beneficial for several reasons including knowing how to follow individuals for their cancer risks and  understanding if other family members could be at risk for cancer and allowing them to undergo genetic testing.   We reviewed the characteristics, features and inheritance patterns of hereditary cancer syndromes. We also discussed genetic testing, including the appropriate family members to test, the process of testing, insurance coverage and turn-around-time for results. We discussed the implications of a negative, positive, carrier and/or variant of uncertain significant result. We recommended Judith Solis pursue genetic testing for a panel that includes genes associated with breast and kidney cancer.   The CancerNext-Expanded gene panel offered by [facility] and includes sequencing, rearrangement, and RNA analysis for the following 77 genes: AIP, ALK, APC, ATM, AXIN2, BAP1, BARD1, BLM, BMPR1A, BRCA1, BRCA2, BRIP1, CDC73, CDH1, CDK4, CDKN1B, CDKN2A, CHEK2, CTNNA1, DICER1, FANCC, FH, FLCN, GALNT12, KIF1B, LZTR1, MAX, MEN1, MET, MLH1, MSH2, MSH3, MSH6, MUTYH, NBN, NF1, NF2, NTHL1, PALB2, PHOX2B, PMS2, POT1, PRKAR1A, PTCH1, PTEN, RAD51C, RAD51D, RB1, RECQL, RET, SDHA, SDHAF2, SDHB, SDHC, SDHD, SMAD4, SMARCA4, SMARCB1, SMARCE1, STK11, SUFU, TMEM127, TP53, TSC1, TSC2, VHL and XRCC2 (sequencing and  deletion/duplication); EGFR, EGLN1, HOXB13, KIT, MITF, PDGFRA, POLD1, and POLE (sequencing only); EPCAM and GREM1 (deletion/duplication only).   Based on Ms. Cardell's personal and family history of breast ancer, she meets medical criteria for genetic testing. Despite that she meets criteria, she may still have an out of pocket cost. We discussed that if her out of pocket cost for testing is over $100, the laboratory will contact her to discuss self-pay and/or patient pay assistance programs.   PLAN: After considering the risks, benefits, and limitations, Judith Solis provided informed consent to pursue genetic testing and the blood sample was sent to [facility] for analysis of the CancerNext-Expanded +RNAinsight Panel. Results should be available within approximately 3 weeks' time, at which point they will be disclosed by telephone to Judith Solis, as will any additional recommendations warranted by these results. Judith Solis will receive a summary of her genetic counseling visit and a copy of her results once available. This information will also be available in Epic.    Lastly, we encouraged Judith Solis to remain in contact with cancer genetics annually so that we can continuously update the family history and inform her of any changes in cancer genetics and testing that may be of benefit for this family.   Judith Solis questions were answered to her satisfaction today. Our contact information was provided should additional questions or concerns arise. Thank you for the referral and allowing [place] to share in the care of your patient.   Alvah Lagrow M. Joette Catching, [place], [facility] Genetic Counselor [email or web address] (P) [phone]  The patient was seen for a total of 35 minutes in face-to-face telehealth genetic counseling. The patient was seen alone.  Drs. Magrinat, Lindi Adie and/or Burr Medico were available to discuss this case as needed.     _______________________________________________________________________ For Office Staff:  Number of people involved in session: 1 Was an Intern/ student involved with case: no

## 2021-01-16 ENCOUNTER — Telehealth: Payer: Self-pay | Admitting: Cardiology

## 2021-01-16 NOTE — Telephone Encounter (Signed)
Went over results of echo with patient.

## 2021-01-16 NOTE — Telephone Encounter (Signed)
New  Message:     Pt said when her last was Echo result was explained to her, she was told her heart was not full capacity. She would like for this to be explained to her,she does understand what this mean.

## 2021-01-19 DIAGNOSIS — K219 Gastro-esophageal reflux disease without esophagitis: Secondary | ICD-10-CM | POA: Diagnosis not present

## 2021-01-19 DIAGNOSIS — E7849 Other hyperlipidemia: Secondary | ICD-10-CM | POA: Diagnosis not present

## 2021-01-19 DIAGNOSIS — I10 Essential (primary) hypertension: Secondary | ICD-10-CM | POA: Diagnosis not present

## 2021-01-20 DIAGNOSIS — Z0000 Encounter for general adult medical examination without abnormal findings: Secondary | ICD-10-CM | POA: Diagnosis not present

## 2021-01-20 DIAGNOSIS — E7800 Pure hypercholesterolemia, unspecified: Secondary | ICD-10-CM | POA: Diagnosis not present

## 2021-01-20 DIAGNOSIS — M8580 Other specified disorders of bone density and structure, unspecified site: Secondary | ICD-10-CM | POA: Diagnosis not present

## 2021-01-20 DIAGNOSIS — R4189 Other symptoms and signs involving cognitive functions and awareness: Secondary | ICD-10-CM | POA: Diagnosis not present

## 2021-01-20 DIAGNOSIS — Z79899 Other long term (current) drug therapy: Secondary | ICD-10-CM | POA: Diagnosis not present

## 2021-01-20 DIAGNOSIS — I10 Essential (primary) hypertension: Secondary | ICD-10-CM | POA: Diagnosis not present

## 2021-01-20 DIAGNOSIS — Z6834 Body mass index (BMI) 34.0-34.9, adult: Secondary | ICD-10-CM | POA: Diagnosis not present

## 2021-01-20 DIAGNOSIS — M859 Disorder of bone density and structure, unspecified: Secondary | ICD-10-CM | POA: Diagnosis not present

## 2021-01-26 ENCOUNTER — Telehealth: Payer: Self-pay | Admitting: Genetic Counselor

## 2021-01-26 ENCOUNTER — Ambulatory Visit: Payer: Self-pay | Admitting: Genetic Counselor

## 2021-01-26 ENCOUNTER — Encounter: Payer: Self-pay | Admitting: Genetic Counselor

## 2021-01-26 DIAGNOSIS — Z1379 Encounter for other screening for genetic and chromosomal anomalies: Secondary | ICD-10-CM | POA: Insufficient documentation

## 2021-01-26 DIAGNOSIS — C50312 Malignant neoplasm of lower-inner quadrant of left female breast: Secondary | ICD-10-CM

## 2021-01-26 DIAGNOSIS — Z803 Family history of malignant neoplasm of breast: Secondary | ICD-10-CM

## 2021-01-26 NOTE — Progress Notes (Signed)
HPI:  [name] was previously seen in the [place] clinic due to a personal and family history of cancer and concerns regarding a hereditary predisposition to cancer. Please refer to our prior cancer genetics clinic note for more information regarding our discussion, assessment and recommendations, at the time. [name] recent genetic test results were disclosed to her, as were recommendations warranted by these results. These results and recommendations are discussed in more detail below.  CANCER HISTORY:  In 2009, at the age of 75, 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 The treatment plan included lumpectomy, adjuvant chemotherapy, adjuvant radiation, and Amrimidex for 5 years. Oncology History  Breast cancer ([place])  01/29/2008 Cancer Staging   Staging form: Breast, AJCC 8th Edition - Clinical stage from 01/29/2008: Stage IA (cT1c, cN0(sn), cM0, G3, ER+, PR+, HER2-) - Signed by Derwood Kaplan, MD on 09/22/2020  Histopathologic type: Infiltrating duct carcinoma, NOS  Stage prefix: Initial diagnosis  Method of lymph node assessment: Sentinel lymph node biopsy  Nuclear grade: G3  Multigene prognostic tests performed: Oncotype DX  Menopausal status: Postmenopausal  Circulating tumor cells (CTC): Not assessed  Prognostic indicators: Oncotype DX 25, intermediate 1.5 cm Rx with TC x 4, radiation and anastrazole x 5 years Stage used in treatment planning: Yes  National guidelines used in treatment planning: Yes  Type of national guideline used in treatment planning: NCCN    08/22/2020 Initial Diagnosis   Breast cancer ([place])    01/25/2021 Genetic Testing   Negative hereditary cancer genetic testing: no pathogenic variants detected in [facility] CancerNext-Expanded +RNAinsight Panel.  The report date is January 25, 2021.  The CancerNext-Expanded gene panel offered by [facility] and includes sequencing, rearrangement, and RNA  analysis for the following 77 genes: AIP, ALK, APC, ATM, AXIN2, BAP1, BARD1, BLM, BMPR1A, BRCA1, BRCA2, BRIP1, CDC73, CDH1, CDK4, CDKN1B, CDKN2A, CHEK2, CTNNA1, DICER1, FANCC, FH, FLCN, GALNT12, KIF1B, LZTR1, MAX, MEN1, MET, MLH1, MSH2, MSH3, MSH6, MUTYH, NBN, NF1, NF2, NTHL1, PALB2, PHOX2B, PMS2, POT1, PRKAR1A, PTCH1, PTEN, RAD51C, RAD51D, RB1, RECQL, RET, SDHA, SDHAF2, SDHB, SDHC, SDHD, SMAD4, SMARCA4, SMARCB1, SMARCE1, STK11, SUFU, TMEM127, TP53, TSC1, TSC2, VHL and XRCC2 (sequencing and deletion/duplication); EGFR, EGLN1, HOXB13, KIT, MITF, PDGFRA, POLD1, and POLE (sequencing only); EPCAM and GREM1 (deletion/duplication only).      FAMILY HISTORY:  We obtained a detailed, 4-generation family history.  Significant diagnoses are listed below:      Family History  Problem Relation Age of Onset   Lung cancer Sister 34        smoking hx   Breast cancer Sister 16   Breast cancer Sister 36   Bladder Cancer Sister 75   Kidney cancer Niece          dx 71s   Cancer Niece          unknown "female" cancer; ? cervical; dx 21s   Breast cancer Cousin          paternal female cousins x2, dx after 63   Breast cancer Cousin          paternal female cousins, unknown #   Lung cancer Cousin          paternal female cousin       [name] is unaware of previous family history of genetic testing for hereditary cancer risks besides that mentioned above. There is no reported Ashkenazi Jewish ancestry. There is no known consanguinity.  GENETIC TEST RESULTS: Genetic testing reported out  on January 25, 2021.  The [facility] CancerNext-Expanded +RNAinsight Panel found no pathogenic mutations. The CancerNext-Expanded gene panel offered by [facility] and includes sequencing, rearrangement, and RNA analysis for the following 77 genes: AIP, ALK, APC, ATM, AXIN2, BAP1, BARD1, BLM, BMPR1A, BRCA1, BRCA2, BRIP1, CDC73, CDH1, CDK4, CDKN1B, CDKN2A, CHEK2, CTNNA1, DICER1, FANCC, FH, FLCN, GALNT12, KIF1B, LZTR1, MAX, MEN1, MET,  MLH1, MSH2, MSH3, MSH6, MUTYH, NBN, NF1, NF2, NTHL1, PALB2, PHOX2B, PMS2, POT1, PRKAR1A, PTCH1, PTEN, RAD51C, RAD51D, RB1, RECQL, RET, SDHA, SDHAF2, SDHB, SDHC, SDHD, SMAD4, SMARCA4, SMARCB1, SMARCE1, STK11, SUFU, TMEM127, TP53, TSC1, TSC2, VHL and XRCC2 (sequencing and deletion/duplication); EGFR, EGLN1, HOXB13, KIT, MITF, PDGFRA, POLD1, and POLE (sequencing only); EPCAM and GREM1 (deletion/duplication only).   The test report has been scanned into EPIC and is located under the Molecular Pathology section of the Results Review tab.  A portion of the result report is included below for reference.     We discussed with [name] that because current genetic testing is not perfect, it is possible there may be a gene mutation in one of these genes that current testing cannot detect, but that chance is small.  We also discussed, that there could be another gene that has not yet been discovered, or that we have not yet tested, that is responsible for the cancer diagnoses in the family. It is also possible there is a hereditary cause for the cancer in the family that [name] did not inherit and therefore was not identified in her testing.  Therefore, it is important to remain in touch with cancer genetics in the future so that we can continue to offer [name] the most up to date genetic testing.  ADDITIONAL GENETIC TESTING:  We discussed with [name] that her genetic testing was fairly extensive.  If there are genes identified to increase cancer risk that can be analyzed in the future, we would be happy to discuss and coordinate this testing at that time.    CANCER SCREENING RECOMMENDATIONS: [name] test result is considered negative (normal).  This means that we have not identified a hereditary cause for her personal history of cancer at this time. Most cancers happen by chance and this negative test suggests that her cancer may fall into this category.    While reassuring, this does not  definitively rule out a hereditary predisposition to cancer. It is still possible that there could be genetic mutations that are undetectable by current technology. There could be genetic mutations in genes that have not been tested or identified to increase cancer risk.  Therefore, it is recommended she continue to follow the cancer management and screening guidelines provided by her oncology and primary healthcare provider.   An individual's cancer risk and medical management are not determined by genetic test results alone. Overall cancer risk assessment incorporates additional factors, including personal medical history, family history, and any available genetic information that may result in a personalized plan for cancer prevention and surveillance  RECOMMENDATIONS FOR FAMILY MEMBERS:  Individuals in this family might be at some increased risk of developing cancer, over the general population risk, simply due to the family history of cancer.  We recommended women in this family have a yearly mammogram beginning at age 40, or 28 years younger than the earliest onset of cancer, an annual clinical breast exam, and perform monthly breast self-exams. Women in this family should also have a gynecological exam as recommended by their primary provider. Family members should be referred for  colonoscopy starting at age 46 or as directed by their physicians.   It is also possible there is a hereditary cause for the cancer in [name]'s family that she did not inherit and therefore was not identified in her.  Based on [name]'s family history, we recommended her paternal cousins with a breast cancer history have genetic counseling and testing. [name] can let us know if we can be of any assistance in coordinating genetic counseling and/or testing for these family members.   FOLLOW-UP: Lastly, we discussed with [name] that cancer genetics is a rapidly advancing field and it is possible that new genetic  tests will be appropriate for her and/or her family members in the future. We encouraged her to remain in contact with cancer genetics on an annual basis so we can update her personal and family histories and let her know of advances in cancer genetics that may benefit this family.   Our contact number was provided. [name] questions were answered to her satisfaction, and she knows she is welcome to call us at anytime with additional questions or concerns.     Judith Solis M. Judith Solis, [place], [facility] Genetic Counselor [email or web address] (P) [phone]

## 2021-01-26 NOTE — Telephone Encounter (Signed)
Revealed negative genetic testing.  Discussed that we do not know why she has a history of breast cancer or why there is cancer in the family. It could be familial, due to a different gene that we are not testing, or maybe our current technology may not be able to pick something up.  It will be important for her to keep in contact with genetics to keep up with whether additional testing may be needed.

## 2021-02-22 DIAGNOSIS — Z20822 Contact with and (suspected) exposure to covid-19: Secondary | ICD-10-CM | POA: Diagnosis not present

## 2021-03-14 DIAGNOSIS — M1711 Unilateral primary osteoarthritis, right knee: Secondary | ICD-10-CM | POA: Diagnosis not present

## 2021-03-14 DIAGNOSIS — M7918 Myalgia, other site: Secondary | ICD-10-CM | POA: Diagnosis not present

## 2021-03-14 DIAGNOSIS — M25561 Pain in right knee: Secondary | ICD-10-CM | POA: Diagnosis not present

## 2021-03-14 DIAGNOSIS — Z981 Arthrodesis status: Secondary | ICD-10-CM | POA: Diagnosis not present

## 2021-03-22 DIAGNOSIS — S335XXA Sprain of ligaments of lumbar spine, initial encounter: Secondary | ICD-10-CM | POA: Diagnosis not present

## 2021-03-23 ENCOUNTER — Ambulatory Visit (INDEPENDENT_AMBULATORY_CARE_PROVIDER_SITE_OTHER): Payer: Medicare Other | Admitting: Neurology

## 2021-03-23 ENCOUNTER — Other Ambulatory Visit: Payer: Self-pay

## 2021-03-23 ENCOUNTER — Encounter: Payer: Self-pay | Admitting: Neurology

## 2021-03-23 DIAGNOSIS — Z9989 Dependence on other enabling machines and devices: Secondary | ICD-10-CM | POA: Diagnosis not present

## 2021-03-23 DIAGNOSIS — R419 Unspecified symptoms and signs involving cognitive functions and awareness: Secondary | ICD-10-CM | POA: Diagnosis not present

## 2021-03-23 DIAGNOSIS — R519 Headache, unspecified: Secondary | ICD-10-CM | POA: Diagnosis not present

## 2021-03-23 DIAGNOSIS — G4733 Obstructive sleep apnea (adult) (pediatric): Secondary | ICD-10-CM | POA: Diagnosis not present

## 2021-03-23 DIAGNOSIS — R400 Somnolence: Secondary | ICD-10-CM | POA: Diagnosis not present

## 2021-03-23 DIAGNOSIS — Z853 Personal history of malignant neoplasm of breast: Secondary | ICD-10-CM

## 2021-03-23 NOTE — Progress Notes (Signed)
Subjective:    Patient ID: Judith Solis is a 75 y.o. female.  HPI    Star Age, MD, PhD [facility] [address] [address] [place], [place] [address]  Dear Dr. Helene Kelp,   I saw your patient, Judith Solis, upon your kind request, in my sleep clinic today for initial consultation of her sleep disturbance, in particular, nonrestorative sleep and daytime tiredness.  The patient is unaccompanied today.  As you know, Judith Solis is a 75 year old right-handed woman with an underlying medical history of hyperlipidemia, breast cancer, reflux disease, aortic stenosis with status post TAVR, hypertension, obstructive sleep apnea, obesity, arthritis, status post bilateral hip replacements, status post lumbar laminectomy, who reports not waking up rested, she feels groggy and tired throughout the day, she feels sluggish in her thinking.  This has been going on for about 3 years but worse in the past 1 year.  After she started AutoPap therapy, she felt improved in some of her symptoms.  Of note, she was previously diagnosed with obstructive sleep apnea.   We were able to get records from your office, she had a home sleep test on 12/27/2018 which showed moderate obstructive sleep apnea with an AHI of 18.4/h, O2 nadir 80%. She has been on AutoPap therapy.  I was able to review her compliance data from 06/27/2020 through 09/24/2020, which is a total of 90 days during which time she used her machine 88 days with percent use days greater than 4 hours at 97.8%, indicating excellent compliance, average usage of 7 hours and 11 minutes, residual AHI 4.5/h, 90th percentile of pressure at 8.7 cm with a range of 4-20.  She reports that she got a replacement in March because of the Respironics recall.  We do not have any compliance data after September 24, 2020.  It looks like her DME company, [employer] is not connected to her new machine.  She has woken up with a headache. I reviewed your office note  from 01/28/2021.  She had blood work through your office at the time and we will request blood test results from your office.  She had CMP, CBC with differential, TSH, lipid panel, and B12 level checked.  As far she knows, her labs were fine.   Her Epworth sleepiness score is 2 out of 24, fatigue severity score is 34 out of 63.  Of note, she is on potentially sedating medications, in particular, she takes trazodone at night, 100 mg strength half a pill to 1 pill at night as needed.  She also takes pramipexole 0.125 mg at bedtime.  She is on Celexa 20 mg daily.  She takes gabapentin 300 mg at night.    She is widowed and lives alone, she has 1 grown daughter.  She quit smoking in 1991.  She does not drink any alcohol.  She drinks caffeine in the form of coffee, 1 or 2 cups/day.  She tries to hydrate well, she estimates that she drinks about 3 bottles of water per day.  She tries to stay active.  She likes to work in the yard.  She denies any significant memory issues, no sudden onset of one-sided weakness or numbness or tingling or droopy face or slurring of speech, no severe headaches.  She does report a strong family history of cancers including a sister with lung cancer, a sister with throat cancer.  She is 1 of a total of 9 siblings, she has 6 sisters and 2 brothers.  She  has nocturia about once per average night.  Per your office note, she had a head CT without contrast on 02/18/2020 which showed no evidence of acute intracranial abnormality, mild chronic small vessel ischemic changes within the cerebral white matter.  She also had a head CT with and without contrast on 09/14/2014 which, per your office note, revealed normal findings.  Addendum, 03/23/2021, 12:23 PM:  I reviewed blood test results from 01/20/2021: CBC within was unremarkable with a WBC of 5.5, RBC 4.43, hemoglobin 13.4, hematocrit 39%.  CMP showed blood sugar of 112, BUN 25, creatinine 0.9, AST 21, ALT 33, alk phos 61.  Lipid panel showed  total cholesterol of 189, triglycerides 167, HDL of 46, LDL 110.  TSH normal at 2.138.  B12 333, vitamin D 50.  Her Past Medical History Is Significant For: Past Medical History:  Diagnosis Date   Aortic valve regurgitation    Arthritis    Asthma    Breast cancer ([place])    a. s/p chemo and radiation   Family history of breast cancer 01/10/2021   GERD (gastroesophageal reflux disease)    Heart disease    Heart failure (HCC)    Hyperlipidemia    Hypertension    Morbid obesity ([place])    Restless leg syndrome    S/P TAVR (transcatheter aortic valve replacement) 03/04/2018   26 mm Edwards Sapien 3 transcatheter heart valve placed via percutaneous right transfemoral approach    Severe aortic stenosis     Her Past Surgical History Is Significant For: Past Surgical History:  Procedure Laterality Date   ABDOMINAL HYSTERECTOMY     BELPHAROPTOSIS REPAIR Bilateral    BREAST SURGERY     LUMPECTOMY WITH CHEMO & RADIATION   CATARACT EXTRACTION     EYE SURGERY     INTRAOPERATIVE TRANSTHORACIC ECHOCARDIOGRAM  03/04/2018   Procedure: INTRAOPERATIVE TRANSTHORACIC ECHOCARDIOGRAM;  Surgeon: Sherren Mocha, MD;  Location: [facility];  Service: Open Heart Surgery;;   JOINT REPLACEMENT     BILATERAL HIPS 2007 & 2008   RIGHT/LEFT HEART CATH AND CORONARY ANGIOGRAPHY N/A 01/31/2018   Procedure: RIGHT/LEFT HEART CATH AND CORONARY ANGIOGRAPHY;  Surgeon: Burnell Blanks, MD;  Location: [facility];  Service: Cardiovascular;  Laterality: N/A;   ROTATOR CUFF REPAIR     TONSILLECTOMY     TRANSCATHETER AORTIC VALVE REPLACEMENT, TRANSFEMORAL N/A 03/04/2018   Procedure: TRANSCATHETER AORTIC VALVE REPLACEMENT, TRANSFEMORAL. EDWARDS 26MM SAPIEN 3 TRANSCATHETER HEART VALVE.;  Surgeon: Sherren Mocha, MD;  Location: [facility];  Service: Open Heart Surgery;  Laterality: N/A;    Her Family History Is Significant For: Family History  Problem Relation Age of Onset   Heart disease Mother    Stroke Father     Lung cancer Sister 35       smoking hx   Hepatitis C Sister    Diabetes Mellitus II Sister    Breast cancer Sister 27   Diabetes Mellitus II Sister    Breast cancer Sister 65   Bladder Cancer Sister 72   Heart Problems Sister    Throat cancer Sister    Heart disease Brother    Heart Problems Brother    Heart disease Maternal Grandfather    Diabetes Mellitus II Paternal Grandfather    Lung cancer Paternal Uncle        dx 22s   Breast cancer Cousin        paternal female cousins x2, dx after 107   Breast cancer Cousin  paternal female cousins, unknown #   Lung cancer Cousin        paternal female cousin   Kidney cancer Niece        dx 56s   Cancer Niece        unknown "female" cancer; ? cervical; dx 69s   Sleep apnea Neg Hx     Her Social History Is Significant For: Social History   Socioeconomic History   Marital status: Widowed    Spouse name: Not on file   Number of children: 1   Years of education: Not on file   Highest education level: GED or equivalent  Occupational History   Not on file  Tobacco Use   Smoking status: Former    Packs/day: 1.50    Years: 20.00    Pack years: 30.00    Types: Cigarettes    Quit date: 06/04/1990    Years since quitting: 30.8   Smokeless tobacco: Never  Vaping Use   Vaping Use: Never used  Substance and Sexual Activity   Alcohol use: No    Comment: none since 1985   Drug use: No   Sexual activity: Not on file  Other Topics Concern   Not on file  Social History Narrative   Lives at home alone   Right handed   Caffeine: 1-2 cups/day of coffee   Social Determinants of Health   Financial Resource Strain: Not on file  Food Insecurity: Not on file  Transportation Needs: Not on file  Physical Activity: Not on file  Stress: Not on file  Social Connections: Not on file    Her Allergies Are:  Allergies  Allergen Reactions   Plavix [Clopidogrel Bisulfate] Itching   Codeine Nausea And Vomiting    Oxycodone-Acetaminophen Other (See Comments)    Hallucinations  :   Her Current Medications Are:  Outpatient Encounter Medications as of 03/23/2021  Medication Sig   albuterol (VENTOLIN HFA) 108 (90 Base) MCG/ACT inhaler Inhale into the lungs as needed for wheezing or shortness of breath.   aspirin EC 81 MG tablet Take 1 tablet (81 mg total) by mouth daily. Swallow whole.   atorvastatin (LIPITOR) 10 MG tablet Take 10 mg by mouth at bedtime.    CALCIUM CITRATE PO Take 2,000 Units by mouth 2 (two) times daily.   Cholecalciferol (VITAMIN D3) 50 MCG (2000 UT) TABS Take 1 tablet by mouth daily.   citalopram (CELEXA) 10 MG tablet Take 20 mg by mouth at bedtime.   clobetasol cream (TEMOVATE) 4.19 % Apply 1 application topically 2 (two) times daily as needed (vaginal irritation).   diclofenac (VOLTAREN) 50 MG EC tablet Take 50 mg by mouth 2 (two) times daily.   gabapentin (NEURONTIN) 300 MG capsule Take 300 mg by mouth daily.   magnesium gluconate (MAGONATE) 500 MG tablet Take 500 mg by mouth at bedtime.   methocarbamol (ROBAXIN) 500 MG tablet Take 500 mg by mouth 4 (four) times daily.   Multiple Vitamin (MULTIVITAMIN) tablet Take 1 tablet by mouth daily.   pantoprazole (PROTONIX) 40 MG tablet Take 40 mg by mouth daily.   pramipexole (MIRAPEX) 0.125 MG tablet Take 0.125 mg by mouth every night at bedtime   predniSONE (DELTASONE) 10 MG tablet Take by mouth daily. Taper pack over 12 days. Take according to pack instructions.   Probiotic Product (PROBIOTIC-10 PO) Take 1 tablet by mouth every other day.   traZODone (DESYREL) 100 MG tablet Take 50 mg by mouth at bedtime. 0.5 TABLET NIGHTLY  valsartan (DIOVAN) 40 MG tablet Take 1 tablet (40 mg total) by mouth 2 (two) times daily.   memantine (NAMENDA) 10 MG tablet Take 10 mg by mouth 2 (two) times daily. (Patient not taking: Reported on 03/23/2021)   sacubitril-valsartan (ENTRESTO) 24-26 MG Take 1 tablet by mouth 2 (two) times daily. (Patient not taking:  Reported on 03/23/2021)   No facility-administered encounter medications on file as of 03/23/2021.  :   Review of Systems:  Out of a complete 14 point review of systems, all are reviewed and negative with the exception of these symptoms as listed below:   Review of Systems  Neurological:        Patient is here alone for a sleep consult. She reports she feels like she is in a fog from her eyes up to the top of her head. This has been happening for at least 3 years. She always feels exhausted. She has been on a CPAP for 2 years after having an HST. [employer] provides her supplies and she uses a Retail banker. She wakes up with a headache every morning and doesn't know if its the band causing it. ESS 2 FSS 34.   Objective:  Neurological Exam  Physical Exam Physical Examination:   Blood pressure is 151/79 with a pulse of 73, height 5 feet 5 inches, weight 216 pounds.  General Examination: The patient is a very pleasant 75 y.o. female in no acute distress. She appears well-developed and well-nourished and well groomed.   HEENT: Normocephalic, atraumatic, pupils are reactive to light, tracking is well-preserved, face is symmetric with normal facial animation and normal facial sensation to light touch, temperature and vibration.  Hearing is intact to tuning fork.  Airway examination reveals mild mouth dryness, tongue protrudes centrally and palate elevates symmetrically.  No carotid bruits, no lip, neck or jaw tremor, neck is supple.  Corrective eyeglasses in place.     Chest: Clear to auscultation without wheezing, rhonchi or crackles noted.  Heart: S1+S2+0, regular and normal without murmurs, rubs or gallops noted.   Abdomen: Soft, non-tender and non-distended.  Extremities: There is no pitting edema.    Skin: Warm and dry without trophic changes noted.   Musculoskeletal: exam reveals prominent arthritic changes in both hands.    Neurologically:  Mental status: The patient is awake,  alert and oriented in all 4 spheres. Her immediate and remote memory, attention, language skills and fund of knowledge are appropriate. There is no evidence of aphasia, agnosia, apraxia or anomia. Speech is clear with normal prosody and enunciation. Thought process is linear. Mood is normal and affect is normal.  Cranial nerves II - XII are as described above under HEENT exam.  Motor exam: Normal bulk, strength and tone is noted. There is no tremor, Romberg is negative. Reflexes are 1+ throughout, toes are downgoing bilaterally.  Fine motor skills and coordination: Normal finger taps, normal hand movements, normal rapid alternating patting in both upper extremities, good foot taps bilaterally. Cerebellar testing: No dysmetria or intention tremor.  Normal finger-to-nose and normal heel-to-shin bilaterally.  There is no truncal or gait ataxia.  Sensory exam: intact to light touch, vibration and temperature in the upper and lower extremities.  Gait, station and balance: She stands without difficulty, she walks without a walking aid, no limp, no shuffling, has preserved arm swing bilaterally.   Assessment and Plan:  Assessment and Plan:  In summary, Judith Solis is a very pleasant 75 y.o.-year old female with an underlying medical history  of hyperlipidemia, breast cancer, reflux disease, aortic stenosis with status post TAVR, hypertension, obstructive sleep apnea, obesity, arthritis, status post bilateral hip replacements, status post lumbar laminectomy, who presents for evaluation of her daytime tiredness, sluggishness in the morning, nonrestorative sleep.  She is compliant with her AutoPap.  She has received a new machine in March 2022 and most recent data was not available but 90-day data before then was excellent with compliance and good apnea control.  She is reassured today.  Examination is nonfocal.  She has no red flags on exam or by history.  I talked to her about her medications, it is possible that  some of her medications are actually affecting her during the day with residual tiredness and grogginess.  These medications include her trazodone, gabapentin, and probably to a lesser degree her citalopram and Mirapex.  She is encouraged to talk to about medication adjustment.  Perhaps she can come off of the trazodone, she indicates that it has not helped.  She is encouraged to try melatonin at night for sleep.  In addition, she may be able to reduce the gabapentin.  From the sleep apnea standpoint she is well managed.  Given her history of cognitive sluggishness, and remote history of breast cancer I would like to proceed with a brain MRI with and without contrast to rule out a structural cause of her symptoms.  She is agreeable.  We will call her with the results or email her through [facility] with the results.  We will plan a follow-up in this clinic for routine checkup of her sleep apnea in 1 year.  We talked about the importance of healthy lifestyle, good nutrition, good hydration, getting exercise on a regular basis and good rest.  I answered all her questions today and she was in agreement with the plan.   Thank you very much for allowing me to participate in the care of this nice patient. If I can be of any further assistance to you please do not hesitate to call me at [phone].  Sincerely,   Star Age, MD, PhD

## 2021-03-23 NOTE — Patient Instructions (Addendum)
It was nice to meet you today. Your history suggests that you may have side effects from the current medications.  Your history is not suggestive of dementia or seizures, or stroke or an underlying serious neurological condition thankfully.  Your exam is good today.  You are fully compliant with your CPAP.  Just ask [employer] to connect your new machine with their system as your usage does not show up after you got your new machine in March 2022.  You had recent blood work through Dr. [employer] office, I will request those results.  We will do a brain scan, called MRI and call you with the test results. We will have to schedule you for this on a separate date. This test requires authorization from your insurance, and we will take care of the insurance process.  If all goes well, we can call you with the results of your scan and plan a follow-up in this clinic in about a year to manage and monitor your sleep apnea.  Please talk to your primary care about the possibility of reducing some of your medications as I do believe you are tiredness and grogginess and sluggishness may tie in with certain medication side effects from trazodone as well as your gabapentin.  You may be able to come off of trazodone and at least reduce the gabapentin for now.

## 2021-03-28 DIAGNOSIS — R252 Cramp and spasm: Secondary | ICD-10-CM | POA: Diagnosis not present

## 2021-03-28 DIAGNOSIS — G4733 Obstructive sleep apnea (adult) (pediatric): Secondary | ICD-10-CM | POA: Diagnosis not present

## 2021-03-28 DIAGNOSIS — N3090 Cystitis, unspecified without hematuria: Secondary | ICD-10-CM | POA: Diagnosis not present

## 2021-03-28 DIAGNOSIS — Z6834 Body mass index (BMI) 34.0-34.9, adult: Secondary | ICD-10-CM | POA: Diagnosis not present

## 2021-03-29 DIAGNOSIS — L578 Other skin changes due to chronic exposure to nonionizing radiation: Secondary | ICD-10-CM | POA: Diagnosis not present

## 2021-03-29 DIAGNOSIS — C44519 Basal cell carcinoma of skin of other part of trunk: Secondary | ICD-10-CM | POA: Diagnosis not present

## 2021-03-29 DIAGNOSIS — L820 Inflamed seborrheic keratosis: Secondary | ICD-10-CM | POA: Diagnosis not present

## 2021-03-29 DIAGNOSIS — C44619 Basal cell carcinoma of skin of left upper limb, including shoulder: Secondary | ICD-10-CM | POA: Diagnosis not present

## 2021-03-29 DIAGNOSIS — L209 Atopic dermatitis, unspecified: Secondary | ICD-10-CM | POA: Diagnosis not present

## 2021-03-30 ENCOUNTER — Telehealth: Payer: Self-pay | Admitting: Neurology

## 2021-03-30 NOTE — Telephone Encounter (Signed)
Medicare/mutual of [place] no auth require. faxed order to [facility] they will reach out to the patient to schedule

## 2021-04-06 DIAGNOSIS — Z981 Arthrodesis status: Secondary | ICD-10-CM | POA: Diagnosis not present

## 2021-04-06 DIAGNOSIS — R200 Anesthesia of skin: Secondary | ICD-10-CM | POA: Diagnosis not present

## 2021-04-07 DIAGNOSIS — R419 Unspecified symptoms and signs involving cognitive functions and awareness: Secondary | ICD-10-CM | POA: Diagnosis not present

## 2021-04-07 DIAGNOSIS — G319 Degenerative disease of nervous system, unspecified: Secondary | ICD-10-CM | POA: Diagnosis not present

## 2021-04-07 DIAGNOSIS — R4182 Altered mental status, unspecified: Secondary | ICD-10-CM | POA: Diagnosis not present

## 2021-04-12 DIAGNOSIS — B373 Candidiasis of vulva and vagina: Secondary | ICD-10-CM | POA: Diagnosis not present

## 2021-04-12 DIAGNOSIS — R1032 Left lower quadrant pain: Secondary | ICD-10-CM | POA: Diagnosis not present

## 2021-04-12 DIAGNOSIS — N390 Urinary tract infection, site not specified: Secondary | ICD-10-CM | POA: Diagnosis not present

## 2021-04-12 DIAGNOSIS — R102 Pelvic and perineal pain: Secondary | ICD-10-CM | POA: Diagnosis not present

## 2021-04-12 DIAGNOSIS — N3281 Overactive bladder: Secondary | ICD-10-CM | POA: Diagnosis not present

## 2021-04-12 DIAGNOSIS — N952 Postmenopausal atrophic vaginitis: Secondary | ICD-10-CM | POA: Diagnosis not present

## 2021-04-12 DIAGNOSIS — R1084 Generalized abdominal pain: Secondary | ICD-10-CM | POA: Diagnosis not present

## 2021-04-12 DIAGNOSIS — Z79899 Other long term (current) drug therapy: Secondary | ICD-10-CM | POA: Diagnosis not present

## 2021-04-12 DIAGNOSIS — R3982 Chronic bladder pain: Secondary | ICD-10-CM | POA: Diagnosis not present

## 2021-04-12 DIAGNOSIS — R1031 Right lower quadrant pain: Secondary | ICD-10-CM | POA: Diagnosis not present

## 2021-04-12 DIAGNOSIS — N9089 Other specified noninflammatory disorders of vulva and perineum: Secondary | ICD-10-CM | POA: Diagnosis not present

## 2021-04-17 DIAGNOSIS — Z23 Encounter for immunization: Secondary | ICD-10-CM | POA: Diagnosis not present

## 2021-04-18 ENCOUNTER — Telehealth: Payer: Self-pay | Admitting: Neurology

## 2021-04-18 NOTE — Telephone Encounter (Signed)
I received patient's brain MRI report.  She had a brain MRI with and without contrast on 04/04/2021 and I reviewed the results: Impression: No evidence of acute intracranial abnormality.  Mild to moderate chronic small vessel ischemic changes within the cerebral white matter.  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  Mild generalized cerebral atrophy.  Please call patient and advise her that her recent brain MRI did not show any acute findings.  She has evidence of an old stroke, it was considered a lacunar stroke, meaning it was smaller than 1 cm in size.  For future stroke prevention, we recommend to continue exercising regularly, taking care of blood sugar values or diabetes management (A1c goal of less than 7.0), good blood pressure (hypertension) control and optimizing cholesterol management (with LDL goal of less than 70), weight management and sleep apnea management.  I recommend that she follow-up with her primary care physician as scheduled and with me as scheduled as well.

## 2021-04-18 NOTE — Telephone Encounter (Signed)
Pt has called Cyril Mourning, RN back. Please call pt.

## 2021-04-18 NOTE — Telephone Encounter (Signed)
I called patient. I discussed her MRI results and recommendations. Patient will keep her follow up with PCP and [facility] of results and recommendations. Pt had no questions at this time but was encouraged to call back if questions arise.

## 2021-04-18 NOTE — Telephone Encounter (Signed)
I called patient to discuss. No answer, left a message asking her to call me back. ?

## 2021-05-17 DIAGNOSIS — C44529 Squamous cell carcinoma of skin of other part of trunk: Secondary | ICD-10-CM | POA: Diagnosis not present

## 2021-05-19 DIAGNOSIS — B349 Viral infection, unspecified: Secondary | ICD-10-CM | POA: Diagnosis not present

## 2021-05-19 DIAGNOSIS — E860 Dehydration: Secondary | ICD-10-CM | POA: Diagnosis not present

## 2021-05-19 DIAGNOSIS — Z853 Personal history of malignant neoplasm of breast: Secondary | ICD-10-CM | POA: Diagnosis not present

## 2021-05-19 DIAGNOSIS — R5383 Other fatigue: Secondary | ICD-10-CM | POA: Diagnosis not present

## 2021-05-19 DIAGNOSIS — Z885 Allergy status to narcotic agent status: Secondary | ICD-10-CM | POA: Diagnosis not present

## 2021-05-19 DIAGNOSIS — K219 Gastro-esophageal reflux disease without esophagitis: Secondary | ICD-10-CM | POA: Diagnosis present

## 2021-05-19 DIAGNOSIS — I10 Essential (primary) hypertension: Secondary | ICD-10-CM | POA: Diagnosis present

## 2021-05-19 DIAGNOSIS — E785 Hyperlipidemia, unspecified: Secondary | ICD-10-CM | POA: Diagnosis present

## 2021-05-19 DIAGNOSIS — E872 Acidosis, unspecified: Secondary | ICD-10-CM | POA: Diagnosis not present

## 2021-05-19 DIAGNOSIS — M1990 Unspecified osteoarthritis, unspecified site: Secondary | ICD-10-CM | POA: Diagnosis present

## 2021-05-19 DIAGNOSIS — Z8616 Personal history of COVID-19: Secondary | ICD-10-CM | POA: Diagnosis not present

## 2021-05-19 DIAGNOSIS — R112 Nausea with vomiting, unspecified: Secondary | ICD-10-CM | POA: Diagnosis not present

## 2021-05-19 DIAGNOSIS — B029 Zoster without complications: Secondary | ICD-10-CM | POA: Diagnosis present

## 2021-05-19 DIAGNOSIS — Z87891 Personal history of nicotine dependence: Secondary | ICD-10-CM | POA: Diagnosis not present

## 2021-05-19 DIAGNOSIS — Z79899 Other long term (current) drug therapy: Secondary | ICD-10-CM | POA: Diagnosis not present

## 2021-05-19 DIAGNOSIS — E8729 Other acidosis: Secondary | ICD-10-CM | POA: Diagnosis present

## 2021-05-19 DIAGNOSIS — R531 Weakness: Secondary | ICD-10-CM | POA: Diagnosis present

## 2021-05-19 DIAGNOSIS — K760 Fatty (change of) liver, not elsewhere classified: Secondary | ICD-10-CM | POA: Diagnosis not present

## 2021-05-19 DIAGNOSIS — N170 Acute kidney failure with tubular necrosis: Secondary | ICD-10-CM | POA: Diagnosis present

## 2021-05-19 DIAGNOSIS — Z888 Allergy status to other drugs, medicaments and biological substances status: Secondary | ICD-10-CM | POA: Diagnosis not present

## 2021-05-19 DIAGNOSIS — F32A Depression, unspecified: Secondary | ICD-10-CM | POA: Diagnosis present

## 2021-05-19 DIAGNOSIS — A0839 Other viral enteritis: Secondary | ICD-10-CM | POA: Diagnosis present

## 2021-05-29 DIAGNOSIS — I10 Essential (primary) hypertension: Secondary | ICD-10-CM | POA: Diagnosis not present

## 2021-05-29 DIAGNOSIS — Z6834 Body mass index (BMI) 34.0-34.9, adult: Secondary | ICD-10-CM | POA: Diagnosis not present

## 2021-05-30 ENCOUNTER — Other Ambulatory Visit: Payer: Self-pay | Admitting: *Deleted

## 2021-05-30 NOTE — Patient Outreach (Signed)
[facility]) Care Management  05/30/2021  Judith Solis Oct 30, 1945 [id number]   Telephone Assessment-Successful  RN spoke with pt today and introduced [facility] services and program availability. Discussed her recent discharge from the hospital. Pt verified recent hospital follow up visit on yesterday with no additional needs presented. Pt states all her ongoing medical conditions are controlled with no needs at this time. Pt inquired about a weight loss program. Discussed possible request for a dietitian or nutritionist. Pt declined the [facility] community case manager but receptive to a health coach (HTN).   Will refer to Health Coach for ongoing [facility] services for possible quarterly follow up call on her [place], RN Care Management Coordinator [place] Office [phone]

## 2021-05-31 ENCOUNTER — Other Ambulatory Visit: Payer: Self-pay | Admitting: *Deleted

## 2021-05-31 DIAGNOSIS — R1031 Right lower quadrant pain: Secondary | ICD-10-CM | POA: Diagnosis not present

## 2021-05-31 DIAGNOSIS — R1032 Left lower quadrant pain: Secondary | ICD-10-CM | POA: Diagnosis not present

## 2021-05-31 DIAGNOSIS — N9089 Other specified noninflammatory disorders of vulva and perineum: Secondary | ICD-10-CM | POA: Diagnosis not present

## 2021-05-31 DIAGNOSIS — N952 Postmenopausal atrophic vaginitis: Secondary | ICD-10-CM | POA: Diagnosis not present

## 2021-05-31 DIAGNOSIS — R102 Pelvic and perineal pain: Secondary | ICD-10-CM | POA: Diagnosis not present

## 2021-05-31 DIAGNOSIS — R3982 Chronic bladder pain: Secondary | ICD-10-CM | POA: Diagnosis not present

## 2021-05-31 DIAGNOSIS — N390 Urinary tract infection, site not specified: Secondary | ICD-10-CM | POA: Diagnosis not present

## 2021-05-31 DIAGNOSIS — N3281 Overactive bladder: Secondary | ICD-10-CM | POA: Diagnosis not present

## 2021-05-31 DIAGNOSIS — B3731 Acute candidiasis of vulva and vagina: Secondary | ICD-10-CM | POA: Diagnosis not present

## 2021-06-27 ENCOUNTER — Other Ambulatory Visit: Payer: Self-pay | Admitting: *Deleted

## 2021-06-27 NOTE — Patient Outreach (Signed)
[facility]) Care Management  06/27/2021  Chemere Steffler [birth year] [id number]  RN Health Coach attempted follow up outreach call to patient.  Patient was unavailable. HIPPA compliance voicemail message left with return callback number.  Plan: RN will call patient again within 30 days.  [facility] Care Management [phone]

## 2021-06-28 ENCOUNTER — Other Ambulatory Visit: Payer: Self-pay | Admitting: *Deleted

## 2021-06-29 NOTE — Patient Instructions (Signed)
Visit Information  Thank you for taking time to visit with me today. Please don't hesitate to contact me if I can be of assistance to you before our next scheduled telephone appointment.  Our next appointment is by telephone on June 2023  Please call the [place] at [phone] if you need to cancel or reschedule your appointment.   Please call the Suicide and Crisis Lifeline: 988 if you are experiencing a Mental Health or [place] or need someone to talk to.  Following is a copy of your care plan:  Care Plan : [place] of Care  Updates made by Zaiah Eckerson, Eppie Gibson, RN since 06/29/2021 12:00 AM     Problem: Knowledge Deficit related to Hypertension and Care Coordination needs   Priority: High     Long-Range Goal: Development Plan of Care for Management of Hypertension   Start Date: 06/28/2021  Expected End Date: 07/21/2022  Priority: High  Note:   Current Barriers:  Knowledge Deficits related to plan of care for management of HTN   RNCM Clinical Goal(s):  Patient will verbalize understanding of plan for management of HTN as evidenced by Continuation of monitoring blood pressure and adhering to low sodium diet  through collaboration with RN Care manager, provider, and care team.   Interventions: Inter-disciplinary care team collaboration (see longitudinal plan of care) Evaluation of current treatment plan related to  self management and patient's adherence to plan as established by provider RN provided a Matter of Choice Blood Pressure Control RN provided education on chair exercises RN sent educational material on How to monitor your blood pressure   Patient Goals/Self-Care Activities: Take medications as prescribed   Attend all scheduled provider appointments Call pharmacy for medication refills 3-7 days in advance of running out of medications Attend church or other social activities Perform all self care activities independently  Perform  IADL's (shopping, preparing meals, housekeeping, managing finances) independently Call provider office for new concerns or questions  check blood pressure 3 times per week learn about high blood pressure take blood pressure log to all doctor appointments call doctor for signs and symptoms of high blood pressure develop an action plan for high blood pressure keep all doctor appointments take medications for blood pressure exactly as prescribed begin an exercise program report new symptoms to your doctor eat more whole grains, fruits and vegetables, lean meats and healthy fats limit salt intake to 2300 mg/day       The patient verbalized understanding of instructions, educational materials, and care plan provided today and agreed to receive a mailed copy of patient instructions, educational materials, and care plan.   Telephone follow up appointment with care management team member scheduled for: The patient has been provided with contact information for the care management team and has been advised to call with any health related questions or concerns.   [place] Care Management [phone]

## 2021-06-29 NOTE — Patient Outreach (Signed)
[facility]) Care Management [place] Note   06/29/2021 Name:  Judith Solis MRN:  [id number] DOB:  [birth year]  Summary: Patient monitors her blood pressure twice a week. She does not have any symptoms when elevated. She uses a CPAP at night. Per patient she is not on a special diet, but does not use additional salt. Her appetite is good. She does not use a cane or walker. Per patient she is currently not exercising.  She stated she has pain in her leg and ankles. She takes her medications as per ordered. She is still currently still driving. Patient is very active with church and social activities.   Recommendations/Changes made from today's visit: Patient will start doing chair exercises    Subjective: Judith Solis is an 75 y.o. year old female who is a primary patient of Ronita Hipps, MD. The care management team was consulted for assistance with care management and/or care coordination needs.    RN Health Coach completed Telephone Visit today.   Objective:  Medications Reviewed Today     Reviewed by Verlin Grills, RN (Case Manager) on 06/28/21 at 1107  Med List Status: <None>   Medication Order Taking? Sig Documenting Provider Last Dose Status Informant  albuterol (VENTOLIN HFA) 108 (90 Base) MCG/ACT inhaler [id number] No Inhale into the lungs as needed for wheezing or shortness of breath.  Patient not taking: Reported on 06/28/2021   [provider] Not Taking Active Self  aspirin EC 81 MG tablet [id number] Yes Take 1 tablet (81 mg total) by mouth daily. Swallow whole. Richardo Priest, MD Taking Active   atorvastatin (LIPITOR) 10 MG tablet [id number] Yes Take 10 mg by mouth at bedtime.  [provider] Taking Active Self           Med Note Kenton Kingfisher, ABBIE R   Fri May 10, 2017  8:17 AM)    CALCIUM CITRATE PO [id number] Yes Take 2,000 Units by mouth 2 (two) times daily. [provider] Taking Active   Cholecalciferol (VITAMIN  D3) 50 MCG (2000 UT) TABS [id number] Yes Take 1 tablet by mouth daily. [provider] Taking Active   citalopram (CELEXA) 10 MG tablet [id number] Yes Take 20 mg by mouth at bedtime. [provider] Taking Active   clobetasol cream (TEMOVATE) 0.05 % [id number]  Apply 1 application topically 2 (two) times daily as needed (vaginal irritation). [provider]  Active Self  diclofenac (VOLTAREN) 50 MG EC tablet [id number]  Take 50 mg by mouth 2 (two) times daily. [provider]  Active Self           Med Note Gildardo Griffes   Thu Mar 23, 2021  9:02 AM) Taking temporarily for muscle problem  gabapentin (NEURONTIN) 300 MG capsule [id number] No Take 300 mg by mouth daily.  Patient not taking: Reported on 06/28/2021   [provider] Not Taking Active   magnesium gluconate (MAGONATE) 500 MG tablet [id number] Yes Take 500 mg by mouth at bedtime. [provider] Taking Active Self           Med Note Lona Millard Mar 23, 2021  8:56 AM) Dewaine Conger for leg cramps  memantine (NAMENDA) 10 MG tablet [id number]  Take 10 mg by mouth 2 (two) times daily.  Patient not taking: Reported on 03/23/2021   [provider]  Active   methocarbamol (ROBAXIN) 500 MG tablet [id number]  Take 500 mg by mouth 4 (four)  times daily. [provider]  Active Self           Med Note Lona Millard Mar 23, 2021  9:01 AM) Taking temporarily for muscle problem  Multiple Vitamin (MULTIVITAMIN) tablet [id number] Yes Take 1 tablet by mouth daily. [provider] Taking Active   pantoprazole (PROTONIX) 40 MG tablet [id number] Yes Take 40 mg by mouth daily. [provider] Taking Active Self  pramipexole (MIRAPEX) 0.125 MG tablet [id number] No Take 0.125 mg by mouth every night at bedtime  Patient not taking: Reported on 06/28/2021   [provider] Not Taking Active Self  predniSONE (DELTASONE) 10 MG tablet [id number]  Take  by mouth daily. Taper pack over 12 days. Take according to pack instructions. [provider]  Active Self           Med Note Lona Millard Mar 23, 2021  9:01 AM) Taking temporarily for muscle problem  Probiotic Product (PROBIOTIC-10 PO) [id number]  Take 1 tablet by mouth every other day. [provider]  Active            Med Note Gildardo Griffes   Thu Mar 23, 2021  8:59 AM) Generic culturelle   sacubitril-valsartan (ENTRESTO) 24-26 MG [id number]  Take 1 tablet by mouth 2 (two) times daily.  Patient not taking: Reported on 03/23/2021   Richardo Priest, MD  Active   traZODone (DESYREL) 100 MG tablet [id number] No Take 50 mg by mouth at bedtime. 0.5 TABLET NIGHTLY  Patient not taking: Reported on 06/28/2021   [provider] Not Taking Active Self  valsartan (DIOVAN) 40 MG tablet [id number] Yes Take 1 tablet (40 mg total) by mouth 2 (two) times daily. Richardo Priest, MD Taking Active              SDOH:  (Social Determinants of Health) assessments and interventions performed:  SDOH Interventions    Flowsheet Row Most Recent Value  SDOH Interventions   Food Insecurity Interventions Intervention Not Indicated  Housing Interventions Intervention Not Indicated  Transportation Interventions Intervention Not Indicated       Care Plan  Review of patient past medical history, allergies, medications, health status, including review of consultants reports, laboratory and other test data, was performed as part of comprehensive evaluation for care management services.   Care Plan : RN Care Manager Plan of Care  Updates made by Rachard Isidro, Eppie Gibson, RN since 06/29/2021 12:00 AM     Problem: Knowledge Deficit related to Hypertension and Care Coordination needs   Priority: High     Long-Range Goal: Development Plan of Care for Management of Hypertension   Start Date: 06/28/2021  Expected End Date: 07/21/2022  Priority: High  Note:   Current  Barriers:  Knowledge Deficits related to plan of care for management of HTN   RNCM Clinical Goal(s):  Patient will verbalize understanding of plan for management of HTN as evidenced by Continuation of monitoring blood pressure and adhering to low sodium diet  through collaboration with RN Care manager, provider, and care team.   Interventions: Inter-disciplinary care team collaboration (see longitudinal plan of care) Evaluation of current treatment plan related to  self management and patient's adherence to plan as established by provider RN provided a Matter of Choice Blood Pressure Control RN provided education on chair exercises RN sent educational material on How to monitor your blood pressure   Patient Goals/Self-Care Activities: Take medications as prescribed  Attend all scheduled provider appointments Call pharmacy for medication refills 3-7 days in advance of running out of medications Attend church or other social activities Perform all self care activities independently  Perform IADL's (shopping, preparing meals, housekeeping, managing finances) independently Call provider office for new concerns or questions  check blood pressure 3 times per week learn about high blood pressure take blood pressure log to all doctor appointments call doctor for signs and symptoms of high blood pressure develop an action plan for high blood pressure keep all doctor appointments take medications for blood pressure exactly as prescribed begin an exercise program report new symptoms to your doctor eat more whole grains, fruits and vegetables, lean meats and healthy fats limit salt intake to 2300 mg/day        Plan: Telephone follow up appointment with care management team member scheduled for:  June 2023 The patient has been provided with contact information for the care management team and has been advised to call with any health related questions or concerns.   [facility] [phone]

## 2021-07-28 ENCOUNTER — Ambulatory Visit (INDEPENDENT_AMBULATORY_CARE_PROVIDER_SITE_OTHER): Payer: Medicare Other | Admitting: Sports Medicine

## 2021-07-28 ENCOUNTER — Ambulatory Visit: Payer: Self-pay | Admitting: *Deleted

## 2021-07-28 ENCOUNTER — Encounter: Payer: Self-pay | Admitting: Sports Medicine

## 2021-07-28 ENCOUNTER — Other Ambulatory Visit: Payer: Self-pay

## 2021-07-28 DIAGNOSIS — L600 Ingrowing nail: Secondary | ICD-10-CM

## 2021-07-28 DIAGNOSIS — M79676 Pain in unspecified toe(s): Secondary | ICD-10-CM | POA: Diagnosis not present

## 2021-07-28 DIAGNOSIS — D689 Coagulation defect, unspecified: Secondary | ICD-10-CM

## 2021-07-28 DIAGNOSIS — M79674 Pain in right toe(s): Secondary | ICD-10-CM

## 2021-07-28 NOTE — Patient Instructions (Signed)

## 2021-07-28 NOTE — Progress Notes (Signed)
Subjective: Memorie Yokoyama is a 76 y.o. female patient presents to office today complaining of a moderately painful incurvated,nails at both big toes that has been present for years. States that her left 1 was done many years ago at the lateral side but still bothers her on the medial side and that the right 1st toe also bothers her on the lateral side of the nail. States that she gets pedicures but they still bother her. Patient denies fever/chills/nausea/vomitting/any other related constitutional symptoms at this time.  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 Swallow whole. 90 tablet 3   atorvastatin (LIPITOR) 10 MG tablet Take 10 mg by mouth at bedtime.      CALCIUM CITRATE PO Take 2,000 Units by mouth 2 (two) times daily.     Cholecalciferol (VITAMIN D3) 50 MCG (2000 UT) TABS Take 1 tablet by mouth daily.     citalopram (CELEXA) 10 MG tablet Take 20 mg by mouth at bedtime.     clobetasol cream (TEMOVATE) 6.64 % Apply 1 application topically 2 (two) times daily as needed (vaginal irritation).     diclofenac (VOLTAREN) 50 MG EC tablet Take 50 mg by mouth 2 (two) times daily.     magnesium gluconate (MAGONATE) 500 MG  tablet Take 500 mg by mouth at bedtime.     methocarbamol (ROBAXIN) 500 MG tablet Take 500 mg by mouth 4 (four) times daily.     Multiple Vitamin (MULTIVITAMIN) tablet Take 1 tablet by mouth daily.     pantoprazole (PROTONIX) 40 MG tablet Take 40 mg by mouth daily.     predniSONE (DELTASONE) 10 MG tablet Take by mouth daily. Taper pack over 12 days. Take according to pack instructions.     Probiotic Product (PROBIOTIC-10 PO) Take 1 tablet by mouth every other day.     valsartan (DIOVAN) 40 MG tablet Take 1 tablet (40 mg total) by mouth 2 (two) times daily. 180 tablet 3   No current facility-administered medications on file prior to visit.    Allergies  Allergen Reactions   Plavix [Clopidogrel Bisulfate] Itching   Codeine Nausea And Vomiting   Oxycodone-Acetaminophen Other (See Comments)    Hallucinations    Objective:  There were no vitals filed for this visit.  General: Well developed, nourished, in no acute distress, alert and oriented x3   Dermatology: Skin is warm, dry and supple bilateral. Left and Right hallux nail appears to be  moderately incurvated with hyperkeratosis formation at the distal aspects of  the left medial and right lateral nail border. (-) Erythema. (+) Minimal Edema. (-) serosanguous  drainage present. The remaining nails appear unremarkable at this time. There are no open  sores, lesions or other signs of infection  present.  Vascular: Dorsalis Pedis artery and Posterior Tibial artery pedal pulses are 1/4 bilateral with immedate capillary fill time. Pedal hair growth present. + varicose veins, No lower extremity edema.   Neruologic: Grossly intact via light touch bilateral.  Musculoskeletal: Tenderness to palpation of the left hallux medial and right hallux lateral nail fold(s). Muscular strength within normal limits in all groups bilateral.   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 Patient elects for PNA right hallux lateral and left hallux medial nail borders with phenol - After a verbal and written consent, injected 3 ml of a 50:50 mixture of 2% plain  lidocaine and 0.5% plain marcaine in a normal hallux block fashion. Next, a  betadine prep was performed. Anesthesia was tested and found to be appropriate.  The offending right hallux lateral and left hallux medial nail border was then incised from the hyponychium to the epinychium. The offending nail border was removed and cleared from the field. The area was curretted for any remaining nail or spicules. Phenol application performed and the area was then flushed with alcohol and dressed with antibiotic cream and a dry sterile dressing. -Patient was instructed to leave the dressing intact for today and begin soaking  in a weak solution of betadine or Epsom salt and water tomorrow. Patient was instructed to  soak for 15-20 minutes each day and apply neosporin and a gauze or bandaid dressing each day. -Patient was instructed to monitor the toe for signs of infection and return to office if toe becomes red, hot or swollen. -Advised ice, elevation, and tylenol or motrin if needed for pain.  -Patient is to return in 2 weeks for follow up care/nail check or sooner if problems arise.  [name], DPM

## 2021-08-11 ENCOUNTER — Other Ambulatory Visit: Payer: Self-pay

## 2021-08-11 ENCOUNTER — Encounter: Payer: Self-pay | Admitting: Sports Medicine

## 2021-08-11 ENCOUNTER — Ambulatory Visit (INDEPENDENT_AMBULATORY_CARE_PROVIDER_SITE_OTHER): Payer: Medicare Other | Admitting: Sports Medicine

## 2021-08-11 DIAGNOSIS — M79675 Pain in left toe(s): Secondary | ICD-10-CM

## 2021-08-11 DIAGNOSIS — M79674 Pain in right toe(s): Secondary | ICD-10-CM

## 2021-08-11 DIAGNOSIS — M79676 Pain in unspecified toe(s): Secondary | ICD-10-CM

## 2021-08-11 DIAGNOSIS — Z9889 Other specified postprocedural states: Secondary | ICD-10-CM

## 2021-08-11 NOTE — Progress Notes (Signed)
Subjective: Judith Solis is a 76 y.o. female patient returns to office today for follow up evaluation after having Right hallux lateral and Left Hallux medial permanent nail avulsion performed on (07-28-21). Patient has been soaking using epsom salt and applying topical antibiotic covered with bandaid daily. Patient denies fever/chills/nausea/vomitting/any other related constitutional symptoms at this time.  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 Swallow whole. 90 tablet 3   atorvastatin (LIPITOR) 10 MG tablet Take 10 mg by mouth at bedtime.      CALCIUM CITRATE PO Take 2,000 Units by mouth 2 (two) times daily.     Cholecalciferol (VITAMIN D3) 50 MCG (2000 UT) TABS Take 1 tablet by mouth daily.     citalopram (CELEXA) 10 MG tablet Take 20 mg by mouth at bedtime.     clobetasol cream (TEMOVATE) 8.41 % Apply 1 application topically 2 (two) times daily as needed (vaginal irritation).     diclofenac (VOLTAREN) 50 MG EC tablet Take 50 mg by mouth 2 (two) times daily.     magnesium gluconate (MAGONATE) 500 MG tablet Take 500 mg by mouth at bedtime.     methocarbamol (ROBAXIN) 500 MG tablet Take 500 mg by mouth 4 (four) times  daily.     Multiple Vitamin (MULTIVITAMIN) tablet Take 1 tablet by mouth daily.     pantoprazole (PROTONIX) 40 MG tablet Take 40 mg by mouth daily.     predniSONE (DELTASONE) 10 MG tablet Take by mouth daily. Taper pack over 12 days. Take according to pack instructions.     Probiotic Product (PROBIOTIC-10 PO) Take 1 tablet by mouth every other day.     valsartan (DIOVAN) 40 MG tablet Take 1 tablet (40 mg total) by mouth 2 (two) times daily. 180 tablet 3   No current facility-administered medications on file prior to visit.    Allergies  Allergen Reactions   Plavix [Clopidogrel Bisulfate] Itching   Codeine Nausea And Vomiting   Oxycodone-Acetaminophen Other (See Comments)    Hallucinations    Objective:  General: Well developed, nourished, in no acute distress, alert and oriented x3   Dermatology: Skin is warm, dry and supple bilateral.Left hallux medial and right hallux lateral nail bed appears to be clean, dry, with mild granular tissue and surrounding eschar/scab. Minimal blanchable erythema. (-) Edema. (-) serosanguous drainage present. The remaining nails appear unremarkable at this time. There are no other lesions or other signs of infection present.  Neurovascular status: Intact. No lower extremity swelling; No pain with calf compression bilateral.  Musculoskeletal: Decreased tenderness to palpation of the left hallux medial and right hallux lateral nail fold(s). Muscular strength within normal limits bilateral.   Assesement and  Plan: Problem List Items Addressed This Visit   None Visit Diagnoses     S/P nail surgery    -  Primary   Pain around toenail       Toe pain, bilateral           -Examined patient  -Cleansed right hallux lateral and left hallux medial nail folds and gently scrubbed with peroxide and q-tip/curetted away eschar at site and applied bandaid.  -Discussed plan of care with patient. -Patient to now begin soaking in a weak solution of Epsom salt and  warm water. Patient was instructed to soak for 15-20 minutes each day until the toe appears normal and there is no tenderness, or swelling at the procedure site, and apply bandaid dressing each day as needed until drainage has resolved. May leave open to air at night. -Educated patient on long term care after nail surgery. -Patient was instructed to monitor the toe for reoccurrence and signs of infection; Patient advised to return to office or go to ER if toe becomes red, hot or swollen. -Patient is to return as needed or sooner if problems arise.  [name], DPM

## 2021-08-16 DIAGNOSIS — H18513 Endothelial corneal dystrophy, bilateral: Secondary | ICD-10-CM | POA: Diagnosis not present

## 2021-08-21 DIAGNOSIS — C44529 Squamous cell carcinoma of skin of other part of trunk: Secondary | ICD-10-CM | POA: Diagnosis not present

## 2021-08-30 DIAGNOSIS — N3281 Overactive bladder: Secondary | ICD-10-CM | POA: Diagnosis not present

## 2021-08-30 DIAGNOSIS — N952 Postmenopausal atrophic vaginitis: Secondary | ICD-10-CM | POA: Diagnosis not present

## 2021-08-30 DIAGNOSIS — R3982 Chronic bladder pain: Secondary | ICD-10-CM | POA: Diagnosis not present

## 2021-08-30 DIAGNOSIS — N390 Urinary tract infection, site not specified: Secondary | ICD-10-CM | POA: Diagnosis not present

## 2021-08-30 DIAGNOSIS — N9089 Other specified noninflammatory disorders of vulva and perineum: Secondary | ICD-10-CM | POA: Diagnosis not present

## 2021-09-07 DIAGNOSIS — G2581 Restless legs syndrome: Secondary | ICD-10-CM | POA: Insufficient documentation

## 2021-09-07 DIAGNOSIS — I509 Heart failure, unspecified: Secondary | ICD-10-CM | POA: Insufficient documentation

## 2021-09-08 ENCOUNTER — Other Ambulatory Visit: Payer: Self-pay

## 2021-09-08 ENCOUNTER — Ambulatory Visit (INDEPENDENT_AMBULATORY_CARE_PROVIDER_SITE_OTHER): Payer: Medicare Other | Admitting: Cardiology

## 2021-09-08 ENCOUNTER — Encounter: Payer: Self-pay | Admitting: Cardiology

## 2021-09-08 VITALS — BP 138/84 | HR 80 | Ht 65.0 in | Wt 217.0 lb

## 2021-09-08 DIAGNOSIS — Z952 Presence of prosthetic heart valve: Secondary | ICD-10-CM

## 2021-09-08 DIAGNOSIS — I10 Essential (primary) hypertension: Secondary | ICD-10-CM

## 2021-09-08 DIAGNOSIS — R0602 Shortness of breath: Secondary | ICD-10-CM | POA: Diagnosis not present

## 2021-09-08 DIAGNOSIS — E7800 Pure hypercholesterolemia, unspecified: Secondary | ICD-10-CM | POA: Diagnosis not present

## 2021-09-08 NOTE — Patient Instructions (Signed)
Medication Instructions:  Your physician recommends that you continue on your current medications as directed. Please refer to the Current Medication list given to you today.  *If you need a refill on your cardiac medications before your next appointment, please call your pharmacy*   Lab Work: Your physician recommends that you return for lab work in:   Labs today: CBC, TSH, Lipids, Pro BNP  If you have labs (blood work) drawn today and your tests are completely normal, you will receive your results only by: MyChart Message (if you have MyChart) OR A paper copy in the mail If you have any lab test that is abnormal or we need to change your treatment, we will call you to review the results.   Testing/Procedures: Your physician has requested that you have an echocardiogram. Echocardiography is a painless test that uses sound waves to create images of your heart. It provides your doctor with information about the size and shape of your heart and how well your hearts chambers and valves are working. This procedure takes approximately one hour. There are no restrictions for this procedure.    Follow-Up: At [facility], you and your health needs are our priority.  As part of our continuing mission to provide you with exceptional heart care, we have created designated Provider Care Teams.  These Care Teams include your primary Cardiologist (physician) and Advanced Practice Providers (APPs -  Physician Assistants and Nurse Practitioners) who all work together to provide you with the care you need, when you need it.  We recommend signing up for the patient portal called "MyChart".  Sign up information is provided on this After Visit Summary.  MyChart is used to connect with patients for Virtual Visits (Telemedicine).  Patients are able to view lab/test results, encounter notes, upcoming appointments, etc.  Non-urgent messages can be sent to your provider as well.   To learn more about what you  can do with MyChart, go to [email or web address].    Your next appointment:   6 week(s)  The format for your next appointment:   In Person  Provider:   Shirlee More, MD    Other Instructions None

## 2021-09-08 NOTE — Addendum Note (Signed)
Addended by: Edwyna Shell I on: 09/08/2021 01:47 PM   Modules accepted: Orders

## 2021-09-08 NOTE — Progress Notes (Signed)
Cardiology Office Note:    Date:  09/08/2021   ID:  Judith Solis, [place] Jun 03, 1946, MRN [id number]  PCP:  Ronita Hipps, MD  Cardiologist:  Shirlee More, MD    Referring MD: Ronita Hipps, MD    ASSESSMENT:    1. SOB (shortness of breath)   2. S/P TAVR (transcatheter aortic valve replacement)   3. Essential hypertension   4. Pure hypercholesterolemia    PLAN:    In order of problems listed above:  She has more short of breath suggestive cardiac etiology we will recheck her echocardiogram proBNP level as well as CBC and thyroid studies she has no obvious edema on physical examination No evidence of valve dysfunction on exam recheck her echocardiogram Stable controlled continue ARB Continue statin check lipids   Next appointment: 6 weeks   Medication Adjustments/Labs and Tests Ordered: Current medicines are reviewed at length with the patient today.  Concerns regarding medicines are outlined above.  No orders of the defined types were placed in this encounter.  No orders of the defined types were placed in this encounter.   Chief Complaint  Patient presents with   Follow-up    After TAVR    History of Present Illness:    Judith Solis is a 76 y.o. female with a hx of hypertension hyperlipidemia left bundle branch block and TAVR for severe aortic stenosis last seen 09/06/2020.   Compliance with diet, lifestyle and medications: Yes  An echocardiogram performed 11/08/2020 ejection fraction is mildly reduced 40 to 45% with grade 1 diastolic dysfunction.  Right ventricle is normal in size function and normal pulmonary artery pressure.  She had moderate mitral annular calcification mild to moderate mitral regurgitation and no stenosis.  TAVR function was normal mean gradient 14 mmHg.  She is not quite the same as she was a year ago she finds her self breathless when she pushes hard more than usual activities but overall feels fatigued she has wheezing at times uses CPAP at  night no edema orthopnea chest pain palpitation or syncope  Her EF was mildly reduced a year ago Past Medical History:  Diagnosis Date   Aortic valve regurgitation    Arthritis    Asthma    Breast cancer ([place])    a. 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/2018   26 mm Edwards Sapien 3 transcatheter heart valve placed via percutaneous right transfemoral approach    Severe aortic stenosis     Past Surgical History:  Procedure Laterality Date   ABDOMINAL HYSTERECTOMY     BELPHAROPTOSIS REPAIR Bilateral    BREAST SURGERY     LUMPECTOMY WITH CHEMO & RADIATION   CATARACT EXTRACTION     EYE SURGERY     INTRAOPERATIVE TRANSTHORACIC ECHOCARDIOGRAM  03/04/2018   Procedure: INTRAOPERATIVE TRANSTHORACIC ECHOCARDIOGRAM;  Surgeon: Sherren Mocha, MD;  Location: [facility];  Service: Open Heart Surgery;;   JOINT REPLACEMENT     BILATERAL HIPS 2007 & 2008   RIGHT/LEFT HEART CATH AND CORONARY ANGIOGRAPHY N/A 01/31/2018   Procedure: RIGHT/LEFT HEART CATH AND CORONARY ANGIOGRAPHY;  Surgeon: Burnell Blanks, MD;  Location: [facility];  Service: Cardiovascular;  Laterality: N/A;   ROTATOR  CUFF REPAIR     TONSILLECTOMY     TRANSCATHETER AORTIC VALVE REPLACEMENT, TRANSFEMORAL N/A 03/04/2018   Procedure: TRANSCATHETER AORTIC VALVE REPLACEMENT, TRANSFEMORAL. EDWARDS 26MM SAPIEN 3 TRANSCATHETER HEART VALVE.;  Surgeon: Sherren Mocha, MD;  Location: [facility];  Service: Open Heart Surgery;  Laterality: N/A;    Current Medications: Current Meds  Medication Sig   aspirin EC 81 MG tablet Take 1 tablet (81 mg total) by mouth daily. Swallow whole.   atorvastatin (LIPITOR) 10 MG tablet Take 10 mg by mouth at bedtime.    CALCIUM CITRATE PO Take  2,000 Units by mouth 2 (two) times daily.   Cholecalciferol (VITAMIN D3) 50 MCG (2000 UT) TABS Take 1 tablet by mouth daily.   citalopram (CELEXA) 10 MG tablet Take 20 mg by mouth at bedtime.   clobetasol cream (TEMOVATE) 1.82 % Apply 1 application topically 2 (two) times daily as needed (vaginal irritation).   estradiol (ESTRACE) 0.1 MG/GM vaginal cream Place 0.1 Applicatorfuls vaginally 3 (three) times a week.   magnesium gluconate (MAGONATE) 500 MG tablet Take 500 mg by mouth at bedtime.   Multiple Vitamin (MULTIVITAMIN) tablet Take 1 tablet by mouth daily.   pantoprazole (PROTONIX) 40 MG tablet Take 40 mg by mouth daily.   Probiotic Product (PROBIOTIC-10 PO) Take 1 tablet by mouth every other day.   valsartan (DIOVAN) 40 MG tablet Take 1 tablet (40 mg total) by mouth 2 (two) times daily.     Allergies:   Plavix [clopidogrel bisulfate], Codeine, and Oxycodone-acetaminophen   Social History   Socioeconomic History   Marital status: Widowed    Spouse name: Not on file   Number of children: 1   Years of education: Not on file   Highest education level: GED or equivalent  Occupational History   Not on file  Tobacco Use   Smoking status: Former    Packs/day: 1.50    Years: 20.00    Pack years: 30.00    Types: Cigarettes    Quit date: 06/04/1990    Years since quitting: 31.2   Smokeless tobacco: Never  Vaping Use   Vaping Use: Never used  Substance and Sexual Activity   Alcohol use: No    Comment: none since 1985   Drug use: No   Sexual activity: Not on file  Other Topics Concern   Not on file  Social History Narrative   Lives at home alone   Right handed   Caffeine: 1-2 cups/day of coffee   Social Determinants of Health   Financial Resource Strain: Not on file  Food Insecurity: No Food Insecurity   Worried About Charity fundraiser in the Last Year: Never true   Independence in the Last Year: Never true  Transportation Needs: No Transportation Needs   Lack of  Transportation (Medical): No   Lack of Transportation (Non-Medical): No  Physical Activity: Not on file  Stress: Not on file  Social Connections: Not on file     Family History: The patient's family history includes Bladder Cancer (age of onset: 58) in her sister; Breast cancer in her cousin and cousin; Breast cancer (age of onset: 82) in her sister; Breast cancer (age of onset: 58) in her sister; Cancer in her niece; Diabetes Mellitus II in her paternal grandfather, sister, and sister; Heart Problems in her brother and sister; Heart disease in her brother, maternal grandfather, and mother; Hepatitis C in her sister; Kidney cancer in her niece; Lung cancer in her cousin  and paternal uncle; Lung cancer (age of onset: 50) in her sister; Stroke in her father; Throat cancer in her sister. There is no history of Sleep apnea. ROS:   Please see the history of present illness.    All other systems reviewed and are negative.  EKGs/Labs/Other Studies Reviewed:    The following studies were reviewed today:   Recent Labs: 12/13/2020: NT-Pro BNP 116  Recent Lipid Panel    Component Value Date/Time   CHOL 165 09/06/2020 0937   TRIG 176 (H) 09/06/2020 0937   HDL 35 (L) 09/06/2020 0937   CHOLHDL 4.7 (H) 09/06/2020 0937   LDLCALC 99 09/06/2020 0937    Physical Exam:    VS:  BP 138/84 (BP Location: Left Arm)    Pulse 80    Ht 5\' 5"  (1.651 m)    Wt 217 lb (98.4 kg)    SpO2 96%    BMI 36.11 kg/m     Wt Readings from Last 3 Encounters:  09/08/21 217 lb (98.4 kg)  09/23/20 218 lb 12.8 oz (99.2 kg)  09/06/20 216 lb 6.4 oz (98.2 kg)     GEN:  Well nourished, well developed in no acute distress HEENT: Normal NECK: No JVD; No carotid bruits LYMPHATICS: No lymphadenopathy CARDIAC: RRR, no murmurs, rubs, gallops RESPIRATORY:  Clear to auscultation without rales, wheezing or rhonchi  ABDOMEN: Soft, non-tender, non-distended MUSCULOSKELETAL:  No edema; No deformity  SKIN: Warm and dry NEUROLOGIC:   Alert and oriented x 3 PSYCHIATRIC:  Normal affect    Signed, Shirlee More, MD  09/08/2021 1:36 PM    [facility]

## 2021-09-09 LAB — CBC
Hematocrit: 38.7 % (ref 34.0–46.6)
Hemoglobin: 13.3 g/dL (ref 11.1–15.9)
MCH: 32 pg (ref 26.6–33.0)
MCHC: 34.4 g/dL (ref 31.5–35.7)
MCV: 93 fL (ref 79–97)
Platelets: 244 10*3/uL (ref 150–450)
RBC: 4.16 x10E6/uL (ref 3.77–5.28)
RDW: 12.9 % (ref 11.7–15.4)
WBC: 6.1 10*3/uL (ref 3.4–10.8)

## 2021-09-09 LAB — LIPID PANEL
Chol/HDL Ratio: 5.2 ratio — ABNORMAL HIGH (ref 0.0–4.4)
Cholesterol, Total: 176 mg/dL (ref 100–199)
HDL: 34 mg/dL — ABNORMAL LOW (ref >39)
LDL Chol Calc (NIH): 96 mg/dL (ref 0–99)
Triglycerides: 275 mg/dL — ABNORMAL HIGH (ref 0–149)
VLDL Cholesterol Cal: 46 mg/dL — ABNORMAL HIGH (ref 5–40)

## 2021-09-09 LAB — TSH: TSH: 2.45 u[IU]/mL (ref 0.450–4.500)

## 2021-09-09 LAB — PRO B NATRIURETIC PEPTIDE: NT-Pro BNP: 144 pg/mL (ref 0–738)

## 2021-09-11 ENCOUNTER — Telehealth: Payer: Self-pay

## 2021-09-11 ENCOUNTER — Ambulatory Visit (INDEPENDENT_AMBULATORY_CARE_PROVIDER_SITE_OTHER): Payer: Medicare Other

## 2021-09-11 ENCOUNTER — Other Ambulatory Visit: Payer: Self-pay

## 2021-09-11 DIAGNOSIS — R0602 Shortness of breath: Secondary | ICD-10-CM | POA: Diagnosis not present

## 2021-09-11 DIAGNOSIS — Z1231 Encounter for screening mammogram for malignant neoplasm of breast: Secondary | ICD-10-CM | POA: Diagnosis not present

## 2021-09-11 DIAGNOSIS — I10 Essential (primary) hypertension: Secondary | ICD-10-CM

## 2021-09-11 DIAGNOSIS — E7800 Pure hypercholesterolemia, unspecified: Secondary | ICD-10-CM

## 2021-09-11 DIAGNOSIS — Z952 Presence of prosthetic heart valve: Secondary | ICD-10-CM

## 2021-09-11 LAB — ECHOCARDIOGRAM COMPLETE
AR max vel: 1.14 cm2
AV Area VTI: 1.03 cm2
AV Area mean vel: 1.12 cm2
AV Mean grad: 13 mmHg
AV Peak grad: 23.8 mmHg
Ao pk vel: 2.44 m/s
Area-P 1/2: 2.45 cm2
Calc EF: 46.9 %
S' Lateral: 3.3 cm
Single Plane A2C EF: 46.4 %
Single Plane A4C EF: 46.2 %

## 2021-09-11 NOTE — Telephone Encounter (Signed)
Left message on patients voicemail to please return our call.   

## 2021-09-11 NOTE — Telephone Encounter (Signed)
Spoke with patient regarding results and recommendation.  Patient verbalizes understanding and is agreeable to plan of care. Advised patient to call back with any issues or concerns.  

## 2021-09-11 NOTE — Telephone Encounter (Signed)
° °  Pt is returning call, she said she will be going out by 1 pm, so to call her before 1 pm today

## 2021-09-11 NOTE — Telephone Encounter (Signed)
-----   Message from Richardo Priest, MD sent at 09/09/2021  6:06 PM EST ----- Regarding: FW: Good result no changes in treatment ----- Message ----- From: Interface, [facility] Lab Results In Sent: 09/09/2021   5:38 AM EST To: Richardo Priest, MD

## 2021-09-12 ENCOUNTER — Telehealth: Payer: Self-pay

## 2021-09-12 NOTE — Telephone Encounter (Signed)
-----   Message from Richardo Priest, MD sent at 09/12/2021  9:51 AM EST ----- Regarding: FW: Good stable result ----- Message ----- From: Interface, [facility] Sent: 09/11/2021   4:51 PM EST To: Richardo Priest, MD

## 2021-09-12 NOTE — Telephone Encounter (Signed)
Spoke with patient regarding results and recommendation.  Patient verbalizes understanding and is agreeable to plan of care. Advised patient to call back with any issues or concerns.  

## 2021-09-13 ENCOUNTER — Encounter: Payer: Self-pay | Admitting: Oncology

## 2021-09-18 NOTE — Progress Notes (Signed)
[facility]  [address] [place],  [place]  [address] [phone]  Clinic Day:  09/25/2021  Referring physician: Ronita Hipps, MD  This document serves as a record of services personally performed by Hosie Poisson, MD. It was created on their behalf by Curry,Lauren E, a trained medical scribe. The creation of this record is based on the scribe's personal observations and the provider's statements to them.  CHIEF COMPLAINT:  CC: History of stage IA hormone receptor positive left breast cancer  Current Treatment:  Surveillance  HISTORY OF PRESENT ILLNESS:  Judith Solis is a 76 y.o. female who we began seeing in August 2014 for follow-up of breast cancer, when she moved to the area from [place].  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  She was treated with lumpectomy.  Pathology revealed a 1.5 cm, grade 3, invasive ductal carcinoma with a negative nodes.  Estrogen and progesterone receptors were positive and her 2 Neu negative.  Oncotype DX score was 25, which is in an intermediate risk category.  She received adjuvant chemotherapy with Taxotere and Cytoxan for 3 months, followed by adjuvant radiation to the left breast, as well as Arimidex for 5 years completed in March 2015.  She is here for yearly routine follow-up and states she has been doing fairly well.  She denies any changes in her breasts.  Prior to her visit today she did undergo bilateral diagnostic mammogram.  She has had problems with severe bladder/urethral/vaginal pressure with urinary frequency.  She was seen by Dr. Matilde Sprang and underwent cystoscopy, which was negative.  He felt her symptoms were most likely due to vaginal dryness.  Her symptoms did not improve with Vesicare.  In 2016, she saw Dr. Jimmie Molly for cardiac evaluation and was found to have congestive heart failure, as well as aortic stenosis.  She was  placed on losartan/hydrochlorothiazide 50/12.5 daily.  She had an echocardiogram at that time.  She had shoulder surgery in 06/27/2015.  She had osteopenia on bone density in March 2015.  Repeat bone density scan in 2017 revealed persistent osteopenia.  There was a 6% worsening of her spine, but a 3% improvement of her left forearm.  Her husband passed away in 2017/06/26 after a prolonged illness.  She had her aortic valve replaced at [facility] in 2019 with a bovine valve, so she does not require full anticoagulation.  She states that she has had two sisters who had breast cancer, and had to undergo bilateral mastectomies.  She also had a sister who died of lung cancer at age 109.  One of the sisters that had breast cancer had advanced bladder cancer and required total cystectomy.  So far genetic testing has been negative on family members.  INTERVAL HISTORY:  Judith Solis is here for annual follow up and states that she has been doing well other than arthralgias of the bilateral hands. Annual mammogram from February was clear. She continues oral calcium and vitamin D daily. She states that one of her sisters had to undergo a bilateral mastectomy and she also had her bladder removed due to malignancy. She states that all of them have had genetic testing, but they were all negative. Blood counts are unremarkable. Her  appetite is good, and she has lost 4 and 1/2 pounds since her last visit.  She denies fever, chills or other signs of infection.  She denies  nausea, vomiting, bowel issues, or abdominal pain.  She denies sore throat, cough, dyspnea, or chest pain.  REVIEW OF SYSTEMS:  Review of Systems  Constitutional: Negative.  Negative for appetite change, chills, fatigue, fever and unexpected weight change.  HENT:  Negative.    Eyes: Negative.   Respiratory: Negative.  Negative for chest tightness, cough, hemoptysis, shortness of breath and wheezing.   Cardiovascular: Negative.  Negative for chest pain,  leg swelling and palpitations.  Gastrointestinal: Negative.  Negative for abdominal distention, abdominal pain, blood in stool, constipation, diarrhea, nausea and vomiting.  Endocrine: Negative.   Genitourinary: Negative.  Negative for difficulty urinating, dysuria, frequency and hematuria.   Musculoskeletal:  Positive for arthralgias (mainly of the bilateral hands). Negative for back pain, flank pain, gait problem and myalgias.  Skin: Negative.   Neurological: Negative.  Negative for dizziness, extremity weakness, gait problem, headaches, light-headedness, numbness, seizures and speech difficulty.  Hematological: Negative.   Psychiatric/Behavioral: Negative.  Negative for depression and sleep disturbance. The patient is not nervous/anxious.     VITALS:  Blood pressure 126/73, pulse 79, temperature 97.8 F (36.6 C), temperature source Oral, resp. rate 18, height 5\' 5"  (1.651 m), weight 214 lb 1.6 oz (97.1 kg), SpO2 94 %.  Wt Readings from Last 3 Encounters:  09/25/21 214 lb 1.6 oz (97.1 kg)  09/08/21 217 lb (98.4 kg)  09/23/20 218 lb 12.8 oz (99.2 kg)    Body mass index is 35.63 kg/m.  Performance status (ECOG): 1 - Symptomatic but completely ambulatory  PHYSICAL EXAM:  Physical Exam Constitutional:      General: She is not in acute distress.    Appearance: Normal appearance. She is normal weight.  HENT:     Head: Normocephalic and atraumatic.  Eyes:     General: No scleral icterus.    Extraocular Movements: Extraocular movements intact.     Conjunctiva/sclera: Conjunctivae normal.     Pupils: Pupils are equal, round, and reactive to light.  Cardiovascular:     Rate and Rhythm: Normal rate and regular rhythm.     Pulses: Normal pulses.     Heart sounds: Normal heart sounds. No murmur heard.   No friction rub. No gallop.  Pulmonary:     Effort: Pulmonary effort is normal. No respiratory distress.     Breath sounds: Normal breath sounds.  Chest:  Breasts:    Right: Normal.      Left: Normal.     Comments: Well healed scar in the lower inner quadrant of the left breast with a small nodule above the center which is stable. No masses in either breast. Faint scar of the left axilla. Abdominal:     General: Bowel sounds are normal. There is no distension.     Palpations: Abdomen is soft. There is no hepatomegaly, splenomegaly or mass.     Tenderness: There is no abdominal tenderness.  Musculoskeletal:        General: Normal range of motion.     Cervical back: Normal range of motion and neck supple.     Right lower leg: No edema.     Left lower leg: No edema.     Comments: Swelling of her PIPs and DIPs of the bilateral hands   Lymphadenopathy:     Cervical: No cervical adenopathy.  Skin:    General: Skin is warm and dry.     Comments: Prominent scar in the upper anterior right chest.  Neurological:     General: No focal deficit present.  Mental Status: She is alert and oriented to person, place, and time. Mental status is at baseline.  Psychiatric:        Mood and Affect: Mood normal.        Behavior: Behavior normal.        Thought Content: Thought content normal.        Judgment: Judgment normal.    LABS:   CBC Latest Ref Rng & Units 09/25/2021 09/08/2021 03/13/2019  WBC - 5.9 6.1 6.0  Hemoglobin 12.0 - 16.0 14.0 13.3 12.9  Hematocrit 36 - 46 42 38.7 37.3  Platelets 150 - 399 250 244 215   CMP Latest Ref Rng & Units 09/25/2021 09/06/2020 05/22/2019  Glucose 65 - 99 mg/dL - 117(H) 81  BUN 4 - 21 21 24 21   Creatinine 0.5 - 1.1 0.7 0.95 0.97  Sodium 137 - 147 136(A) 138 140  Potassium 3.4 - 5.3 4.7 4.8 4.6  Chloride 99 - 108 104 101 103  CO2 13 - 22 26(A) 24 26  Calcium 8.7 - 10.7 9.2 9.3 9.2  Total Protein 6.0 - 8.5 g/dL - 6.8 6.8  Total Bilirubin 0.0 - 1.2 mg/dL - 0.4 0.4  Alkaline Phos 25 - 125 67 85 82  AST 13 - 35 31 19 24   ALT 7 - 35 39(A) 28 29    STUDIES:    EXAM: 09/11/2021 DIGITAL SCREENING BILATERAL MAMMOGRAM WITH TOMOSYNTHESIS  AND CAD   TECHNIQUE:  Bilateral screening digital craniocaudal and mediolateral oblique  mammograms were obtained. Bilateral screening digital breast  tomosynthesis was performed. The images were evaluated with  computer-aided detection.   COMPARISON: Previous exam(s).   ACR Breast Density Category b: There are scattered areas of  fibroglandular density.   FINDINGS:  There are no findings suspicious for malignancy. Expected post  lumpectomy changes in the LEFT breast.   IMPRESSION:  No mammographic evidence of malignancy.   Allergies:  Allergies  Allergen Reactions   Plavix [Clopidogrel Bisulfate] Itching   Codeine Nausea And Vomiting   Oxycodone-Acetaminophen Other (See Comments)    Hallucinations    Current Medications: Current Outpatient Medications  Medication Sig Dispense Refill   aspirin EC 81 MG tablet Take 1 tablet (81 mg total) by mouth daily. Swallow whole. 90 tablet 3   atorvastatin (LIPITOR) 10 MG tablet Take 10 mg by mouth at bedtime.      CALCIUM CITRATE PO Take 2,000 Units by mouth 2 (two) times daily.     Cholecalciferol (VITAMIN D3) 50 MCG (2000 UT) TABS Take 1 tablet by mouth daily.     citalopram (CELEXA) 10 MG tablet Take 20 mg by mouth at bedtime.     clobetasol cream (TEMOVATE) 7.32 % Apply 1 application topically 2 (two) times daily as needed (vaginal irritation).     estradiol (ESTRACE) 0.1 MG/GM vaginal cream Place 0.1 Applicatorfuls vaginally 3 (three) times a week.     magnesium gluconate (MAGONATE) 500 MG tablet Take 500 mg by mouth 2 (two) times daily.     Multiple Vitamin (MULTIVITAMIN) tablet Take 1 tablet by mouth daily.     pantoprazole (PROTONIX) 40 MG tablet Take 40 mg by mouth daily.     Probiotic Product (PROBIOTIC-10 PO) Take 1 tablet by mouth every other day.     valsartan (DIOVAN) 40 MG tablet Take 1 tablet (40 mg total) by mouth 2 (two) times daily. 180 tablet 3   No current facility-administered medications for this visit.      ASSESSMENT &  PLAN:   Assessment:   1.  Remote history of stage IA hormone receptor positive breast cancer, diagnosed in July 2009.  She remains without evidence of recurrence.    2.  Osteopenia for which she is taking oral calcium and vitamin D. Bone densities are scheduled through Dr. Helene Kelp.    Plan: As she continues to do well, we will plan to see her back in 1 year with bilateral screening mammogram.  The patient understands the plans discussed today and is in agreement with them.  She knows to contact our office if she develops concerns regarding her breast cancer.   I provided 15 minutes of face-to-face time during this this encounter and > 50% was spent counseling as documented under my assessment and plan.    Derwood Kaplan, MD [facility] [address] [place] [address] Dept: [phone] Dept Fax: [phone]   I, Rita Ohara, am acting as scribe for Derwood Kaplan, MD  I have reviewed this report as typed by the medical scribe, and it is complete and accurate.  Hermina Barters

## 2021-09-22 ENCOUNTER — Other Ambulatory Visit: Payer: Self-pay | Admitting: Oncology

## 2021-09-22 ENCOUNTER — Encounter: Payer: Self-pay | Admitting: Sports Medicine

## 2021-09-22 ENCOUNTER — Ambulatory Visit (INDEPENDENT_AMBULATORY_CARE_PROVIDER_SITE_OTHER): Payer: Medicare Other | Admitting: Sports Medicine

## 2021-09-22 ENCOUNTER — Other Ambulatory Visit: Payer: Self-pay

## 2021-09-22 DIAGNOSIS — M79674 Pain in right toe(s): Secondary | ICD-10-CM | POA: Diagnosis not present

## 2021-09-22 DIAGNOSIS — Z9889 Other specified postprocedural states: Secondary | ICD-10-CM | POA: Diagnosis not present

## 2021-09-22 DIAGNOSIS — M79675 Pain in left toe(s): Secondary | ICD-10-CM

## 2021-09-22 DIAGNOSIS — C50312 Malignant neoplasm of lower-inner quadrant of left female breast: Secondary | ICD-10-CM

## 2021-09-22 NOTE — Progress Notes (Signed)
Subjective: ?Judith Solis is a 76 y.o. female patient returns to office today for follow up evaluation after having Right hallux lateral and Left Hallux medial permanent nail avulsion performed on (07-28-21). Patient reports that she had an episode where it was red near the cuticle on the left and right toenail and states that the right toenail lifts up slightly with some dead skin to the area and is wondering if this is normal.  Patient also reports that occasionally she gets some tingling in the left great toenail and is wondering if this is normal patient denies any active drainage, warmth, or any other signs of infection at this time. ? ?Patient Active Problem List  ? Diagnosis Date Noted  ? Restless leg syndrome 09/07/2021  ? Heart failure ([place]) 09/07/2021  ? Genetic testing 01/26/2021  ? Family history of breast cancer 01/10/2021  ? Severe aortic stenosis   ? Morbid obesity ([place])   ? Hypertension   ? GERD (gastroesophageal reflux disease)   ? Breast cancer ([place])   ? Asthma   ? Arthritis   ? Aortic valve regurgitation   ? 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 DDD (degenerative disc disease), cervical 09/12/2017  ? Cervical spondylosis without myelopathy 08/29/2017  ? Hyperlipidemia 04/05/2017  ? Essential hypertension 04/05/2017  ? PVCs (premature ventricular contractions) 10/21/2016  ? Osteopenia after menopause 09/22/2013  ?  Class: Chronic  ? ? ?Current Outpatient Medications on File Prior to Visit  ?Medication Sig Dispense Refill  ? aspirin EC 81 MG tablet Take 1 tablet (81 mg total) by mouth daily. Swallow whole. 90 tablet 3  ? atorvastatin (LIPITOR) 10 MG tablet Take 10 mg by mouth at bedtime.     ? CALCIUM CITRATE PO Take 2,000 Units by mouth 2 (two) times daily.    ? Cholecalciferol (VITAMIN D3) 50 MCG (2000 UT) TABS Take 1 tablet by mouth daily.    ? citalopram (CELEXA) 10 MG tablet Take 20 mg by mouth at bedtime.    ? clobetasol cream (TEMOVATE) 4.43 % Apply 1 application  topically 2 (two) times daily as needed (vaginal irritation).    ? estradiol (ESTRACE) 0.1 MG/GM vaginal cream Place 0.1 Applicatorfuls vaginally 3 (three) times a week.    ? fluconazole (DIFLUCAN) 150 MG tablet     ? gabapentin (NEURONTIN) 100 MG capsule     ? magnesium gluconate (MAGONATE) 500 MG tablet Take 500 mg by mouth at bedtime.    ? Multiple Vitamin (MULTIVITAMIN) tablet Take 1 tablet by mouth daily.    ? mupirocin ointment (BACTROBAN) 2 % Apply topically 2 (two) times daily as needed.    ? nitrofurantoin (MACRODANTIN) 100 MG capsule Take 100 mg by mouth 2 (two) times daily.    ? ondansetron (ZOFRAN-ODT) 4 MG disintegrating tablet Take 4 mg by mouth every 6 (six) hours as needed.    ? pantoprazole (PROTONIX) 40 MG tablet Take 40 mg by mouth daily.    ? Probiotic Product (PROBIOTIC-10 PO) Take 1 tablet by mouth every other day.    ? valACYclovir (VALTREX) 1000 MG tablet Take 1,000 mg by mouth every 8 (eight) hours.    ? valsartan (DIOVAN) 40 MG tablet Take 1 tablet (40 mg total) by mouth 2 (two) times daily. 180 tablet 3  ? ?No current facility-administered medications on file prior to visit.  ? ? ?Allergies  ?Allergen Reactions  ? Plavix [Clopidogrel Bisulfate] Itching  ? Codeine Nausea And Vomiting  ? Oxycodone-Acetaminophen Other (See Comments)  ?  Hallucinations  ? ? ?Objective:  ?General: Well developed, nourished, in no acute distress, alert and oriented x3  ? ?Dermatology: Skin is warm, dry and supple bilateral.Left hallux medial and right hallux lateral nail bed appears to be clean, dry, and well-healed there is mild white discoloration noted to the margins of both toenails likely from the nail slightly lifting in the corner after the nail avulsion procedure, no current erythema, edema, or any acute drainage or signs or symptoms of infection at this time at both big toes.  ? ?Neurovascular status: Intact. No lower extremity swelling; No pain with calf compression bilateral. ? ?Musculoskeletal: No  reproducible tenderness to palpation of the left hallux medial and right hallux lateral nail fold(s). Muscular strength within normal limits bilateral.  ? ?Assesement and Plan: ?Problem List Items Addressed This Visit   ?None ?Visit Diagnoses   ? ? S/P nail surgery    -  Primary  ? Toe pain, bilateral      ? ?  ? ? ?-Examined patient  ?-Previous nail procedure sites appear to be well-healed ?-Advised patient that discoloration of nail can happen as a result of the ingrown procedure but this discoloration since slowly grow out and at this time recommend closely monitoring ?-Advised patient that occasional numbness or tingling pain could be the result of her nerves and may happen after she has had procedure but at this time there is nothing more to do but to closely monitor ?-Patient may resume with pedicures but did advise patient to make sure the pedicurist is not digging up underneath the nail to make it lift since she had procedures earlier this year on her toenails; patient expressed understanding ?-Patient is to return as needed or sooner if problems arise. ? ?[name], DPM  ?

## 2021-09-25 ENCOUNTER — Encounter: Payer: Self-pay | Admitting: Oncology

## 2021-09-25 ENCOUNTER — Other Ambulatory Visit: Payer: Self-pay | Admitting: Oncology

## 2021-09-25 ENCOUNTER — Inpatient Hospital Stay: Payer: Medicare Other

## 2021-09-25 ENCOUNTER — Telehealth: Payer: Self-pay | Admitting: Oncology

## 2021-09-25 ENCOUNTER — Other Ambulatory Visit: Payer: Self-pay

## 2021-09-25 ENCOUNTER — Inpatient Hospital Stay: Payer: Medicare Other | Attending: Oncology | Admitting: Oncology

## 2021-09-25 VITALS — BP 126/73 | HR 79 | Temp 97.8°F | Resp 18 | Ht 65.0 in | Wt 214.1 lb

## 2021-09-25 DIAGNOSIS — Z170 Estrogen receptor positive status [ER+]: Secondary | ICD-10-CM | POA: Diagnosis not present

## 2021-09-25 DIAGNOSIS — Z1231 Encounter for screening mammogram for malignant neoplasm of breast: Secondary | ICD-10-CM

## 2021-09-25 DIAGNOSIS — C50312 Malignant neoplasm of lower-inner quadrant of left female breast: Secondary | ICD-10-CM

## 2021-09-25 DIAGNOSIS — D649 Anemia, unspecified: Secondary | ICD-10-CM | POA: Diagnosis not present

## 2021-09-25 LAB — HEPATIC FUNCTION PANEL
ALT: 39 — AB (ref 7–35)
AST: 31 (ref 13–35)
Alkaline Phosphatase: 67 (ref 25–125)
Bilirubin, Total: 0.6

## 2021-09-25 LAB — CBC AND DIFFERENTIAL
HCT: 42 (ref 36–46)
Hemoglobin: 14 (ref 12.0–16.0)
Neutrophils Absolute: 3.25
Platelets: 250 (ref 150–399)
WBC: 5.9

## 2021-09-25 LAB — BASIC METABOLIC PANEL
BUN: 21 (ref 4–21)
CO2: 26 — AB (ref 13–22)
Chloride: 104 (ref 99–108)
Creatinine: 0.7 (ref 0.5–1.1)
Glucose: 127
Potassium: 4.7 (ref 3.4–5.3)
Sodium: 136 — AB (ref 137–147)

## 2021-09-25 LAB — CBC: RBC: 4.46 (ref 3.87–5.11)

## 2021-09-25 LAB — COMPREHENSIVE METABOLIC PANEL
Albumin: 4.2 (ref 3.5–5.0)
Calcium: 9.2 (ref 8.7–10.7)

## 2021-09-25 NOTE — Telephone Encounter (Signed)
Patient has been scheduled for follow-up visit per 09/25/21 los. Pt given an appt calendar with date and time. ? ?Screening mammogram scheduled for 09/12/22 @ 11:30 am. ?

## 2021-09-27 DIAGNOSIS — L570 Actinic keratosis: Secondary | ICD-10-CM | POA: Diagnosis not present

## 2021-09-27 DIAGNOSIS — C44529 Squamous cell carcinoma of skin of other part of trunk: Secondary | ICD-10-CM | POA: Diagnosis not present

## 2021-09-27 DIAGNOSIS — L821 Other seborrheic keratosis: Secondary | ICD-10-CM | POA: Diagnosis not present

## 2021-10-12 DIAGNOSIS — G5600 Carpal tunnel syndrome, unspecified upper limb: Secondary | ICD-10-CM | POA: Diagnosis not present

## 2021-10-18 DIAGNOSIS — Z20822 Contact with and (suspected) exposure to covid-19: Secondary | ICD-10-CM | POA: Diagnosis not present

## 2021-10-24 DIAGNOSIS — G5602 Carpal tunnel syndrome, left upper limb: Secondary | ICD-10-CM | POA: Diagnosis not present

## 2021-10-26 ENCOUNTER — Ambulatory Visit (INDEPENDENT_AMBULATORY_CARE_PROVIDER_SITE_OTHER): Payer: Medicare Other | Admitting: Cardiology

## 2021-10-26 ENCOUNTER — Encounter: Payer: Self-pay | Admitting: Cardiology

## 2021-10-26 VITALS — BP 148/74 | HR 84 | Ht 65.0 in | Wt 217.0 lb

## 2021-10-26 DIAGNOSIS — R0602 Shortness of breath: Secondary | ICD-10-CM

## 2021-10-26 DIAGNOSIS — I10 Essential (primary) hypertension: Secondary | ICD-10-CM

## 2021-10-26 DIAGNOSIS — Z952 Presence of prosthetic heart valve: Secondary | ICD-10-CM | POA: Diagnosis not present

## 2021-10-26 DIAGNOSIS — Z01812 Encounter for preprocedural laboratory examination: Secondary | ICD-10-CM

## 2021-10-26 MED ORDER — FUROSEMIDE 20 MG PO TABS: 20.0000 mg | ORAL_TABLET | Freq: Every day | ORAL | 3 refills | Status: DC

## 2021-10-26 MED ORDER — VALSARTAN 40 MG PO TABS: 40.0000 mg | ORAL_TABLET | Freq: Two times a day (BID) | ORAL | 3 refills | Status: DC

## 2021-10-26 NOTE — Progress Notes (Signed)
?Cardiology Office Note:   ? ?Date:  10/26/2021  ? ?ID:  Judith Solis, DOB [birth year], MRN [id number] ? ?PCP:  Ronita Hipps, MD  ?Cardiologist:  Shirlee More, MD   ? ?Referring MD: Ronita Hipps, MD  ? ? ?ASSESSMENT:   ? ?1. SOB (shortness of breath)   ?2. S/P TAVR (transcatheter aortic valve replacement)   ?3. Essential hypertension   ? ?PLAN:   ? ?In order of problems listed above: ? ?Despite a normal BNP level I think what she has is hypertensive heart disease with heart failure and we will put her on low-dose loop diuretic weight daily bring her back in 1 week and check renal function I expect her weight will fall 5 to 10 pounds or shortness of breath will improve ?Continue current antihypertensives ?Good result from TAVR ? ? ?Next appointment: 6 weeks ? ? ?Medication Adjustments/Labs and Tests Ordered: ?Current medicines are reviewed at length with the patient today.  Concerns regarding medicines are outlined above.  ?No orders of the defined types were placed in this encounter. ? ?No orders of the defined types were placed in this encounter. ? ? ?Chief Complaint  ?Patient presents with  ? Follow-up  ? Shortness of Breath  ? ? ?History of Present Illness:   ? ?Judith Solis is a 76 y.o. female with a hx of severe aortic stenosis with TAVR hypertensive heart disease hyperlipidemia left bundle branch block. An echocardiogram performed 11/08/2020 ejection fraction is mildly reduced 40 to 45% with grade 1 diastolic dysfunction.  Right ventricle is normal in size function and normal pulmonary artery pressure.  She had moderate mitral annular calcification mild to moderate mitral regurgitation and no stenosis.  TAVR function was normal mean gradient 14 mmHg.  She was last seen 09/08/2021 with increasing shortness of breath suggesting heart failure. ? ?Compliance with diet, lifestyle and medications: Yes ? ?Her history is quite suggestive of heart failure her weight is up 10 pounds plus in the last year she is short  of breath at night and when she uses her CPAP and she is short of breath walking a longer distance or working in her garden.  She is unaware she has +1 edema.  No chest pain palpitation or syncope. ? ?Her proBNP level was low at 144 hemoglobin normal 13.3 ? ?Echocardiogram 09/11/2021 showed similar mild LV dysfunction EF 45 to 50% with mild concentric LVH and grade 1 diastolic dysfunction right ventricle is normal size function and pulmonary artery pressure normal TAVR function was exhibited and there is mild mitral regurgitation. ?Past Medical History:  ?Diagnosis Date  ? Aortic valve regurgitation   ? Arthritis   ? Asthma   ? Breast cancer ([place])   ? a. s/p chemo and radiation  ? Family history of breast cancer 01/10/2021  ? GERD (gastroesophageal reflux disease)   ? Heart disease   ? Heart failure ([place])   ? Hyperlipidemia   ? Hypertension   ? Morbid obesity ([name])   ? Restless leg syndrome   ? 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 26 mm Edwards Sapien 3 transcatheter heart valve placed via percutaneous right transfemoral approach   ? Severe aortic stenosis   ? ? ?Past Surgical History:  ?Procedure Laterality Date  ? ABDOMINAL HYSTERECTOMY    ? BELPHAROPTOSIS REPAIR Bilateral   ? BREAST SURGERY    ? LUMPECTOMY WITH CHEMO & RADIATION  ? CATARACT EXTRACTION    ? EYE SURGERY    ? INTRAOPERATIVE  TRANSTHORACIC ECHOCARDIOGRAM  03/04/2018  ? Procedure: INTRAOPERATIVE TRANSTHORACIC ECHOCARDIOGRAM;  Surgeon: Sherren Mocha, MD;  Location: [facility];  Service: Open Heart Surgery;;  ? JOINT REPLACEMENT    ? BILATERAL HIPS 2007 & 2008  ? RIGHT/LEFT HEART CATH AND CORONARY ANGIOGRAPHY N/A 01/31/2018  ? Procedure: RIGHT/LEFT HEART CATH AND CORONARY ANGIOGRAPHY;  Surgeon: Burnell Blanks, MD;  Location: [facility];  Service: Cardiovascular;  Laterality: N/A;  ? ROTATOR CUFF REPAIR    ? TONSILLECTOMY    ? TRANSCATHETER AORTIC VALVE REPLACEMENT, TRANSFEMORAL N/A 03/04/2018  ? Procedure:  TRANSCATHETER AORTIC VALVE REPLACEMENT, TRANSFEMORAL. EDWARDS 26MM SAPIEN 3 TRANSCATHETER HEART VALVE.;  Surgeon: Sherren Mocha, MD;  Location: [facility];  Service: Open Heart Surgery;  Laterality: N/A;  ? ? ?Current Medications: ?Current Meds  ?Medication Sig  ? aspirin EC 81 MG tablet Take 1 tablet (81 mg total) by mouth daily. Swallow whole.  ? atorvastatin (LIPITOR) 10 MG tablet Take 10 mg by mouth at bedtime.   ? CALCIUM CITRATE PO Take 2,000 Units by mouth 2 (two) times daily.  ? Cholecalciferol (VITAMIN D3) 50 MCG (2000 UT) TABS Take 1 tablet by mouth daily.  ? citalopram (CELEXA) 10 MG tablet Take 20 mg by mouth at bedtime.  ? clobetasol cream (TEMOVATE) 4.13 % Apply 1 application topically 2 (two) times daily as needed (vaginal irritation).  ? estradiol (ESTRACE) 0.1 MG/GM vaginal cream Place 0.1 Applicatorfuls vaginally 3 (three) times a week.  ? magnesium gluconate (MAGONATE) 500 MG tablet Take 500 mg by mouth daily.  ? Multiple Vitamin (MULTIVITAMIN) tablet Take 1 tablet by mouth daily.  ? pantoprazole (PROTONIX) 40 MG tablet Take 40 mg by mouth daily.  ? Probiotic Product (PROBIOTIC-10 PO) Take 1 tablet by mouth every other day.  ? valsartan (DIOVAN) 40 MG tablet Take 1 tablet (40 mg total) by mouth 2 (two) times daily.  ?  ? ?Allergies:   Plavix [clopidogrel bisulfate], Codeine, and Oxycodone-acetaminophen  ? ?Social History  ? ?Socioeconomic History  ? Marital status: Widowed  ?  Spouse name: Not on file  ? Number of children: 1  ? Years of education: Not on file  ? Highest education level: GED or equivalent  ?Occupational History  ? Not on file  ?Tobacco Use  ? Smoking status: Former  ?  Packs/day: 1.50  ?  Years: 20.00  ?  Pack years: 30.00  ?  Types: Cigarettes  ?  Quit date: 06/04/1990  ?  Years since quitting: 31.4  ? Smokeless tobacco: Never  ?Vaping Use  ? Vaping Use: Never used  ?Substance and Sexual Activity  ? Alcohol use: No  ?  Comment: none since 1985  ? Drug use: No  ? Sexual activity: Not  on file  ?Other Topics Concern  ? Not on file  ?Social History Narrative  ? Lives at home alone  ? Right handed  ? Caffeine: 1-2 cups/day of coffee  ? ?Social Determinants of Health  ? ?Financial Resource Strain: Not on file  ?Food Insecurity: No Food Insecurity  ? Worried About Charity fundraiser in the Last Year: Never true  ? Ran Out of Food in the Last Year: Never true  ?Transportation Needs: No Transportation Needs  ? Lack of Transportation (Medical): No  ? Lack of Transportation (Non-Medical): No  ?Physical Activity: Not on file  ?Stress: Not on file  ?Social Connections: Not on file  ?  ? ?Family History: ?The patient's family history includes Bladder Cancer (age  of onset: 12) in her sister; Breast cancer in her cousin and cousin; Breast cancer (age of onset: 11) in her sister; Breast cancer (age of onset: 108) in her sister; Cancer in her niece; Diabetes Mellitus II in her paternal grandfather, sister, and sister; Heart Problems in her brother and sister; Heart disease in her brother, maternal grandfather, and mother; Hepatitis C in her sister; Kidney cancer in her niece; Lung cancer in her cousin and paternal uncle; Lung cancer (age of onset: 73) in her sister; Stroke in her father; Throat cancer in her sister. There is no history of Sleep apnea. ?ROS:   ?Please see the history of present illness.    ?All other systems reviewed and are negative. ? ?EKGs/Labs/Other Studies Reviewed:   ? ?The following studies were reviewed today: ? ? ?Recent Labs: ?09/08/2021: NT-Pro BNP 144; TSH 2.450 ?09/25/2021: ALT 39; BUN 21; Creatinine 0.7; Hemoglobin 14.0; Platelets 250; Potassium 4.7; Sodium 136  ?Recent Lipid Panel ?   ?Component Value Date/Time  ? CHOL 176 09/08/2021 1348  ? TRIG 275 (H) 09/08/2021 1348  ? HDL 34 (L) 09/08/2021 1348  ? CHOLHDL 5.2 (H) 09/08/2021 1348  ? [place] 96 09/08/2021 1348  ? ? ?Physical Exam:   ? ?VS:  BP (!) 148/74 (BP Location: Left Arm)   Pulse 84   Ht '5\' 5"'$  (1.651 m)   Wt 217 lb (98.4  kg)   SpO2 96%   BMI 36.11 kg/m?    ? ?Wt Readings from Last 3 Encounters:  ?10/26/21 217 lb (98.4 kg)  ?09/25/21 214 lb 1.6 oz (97.1 kg)  ?09/08/21 217 lb (98.4 kg)  ?  ? ?GEN:  Well nourished, well developed in n

## 2021-10-26 NOTE — Patient Instructions (Signed)
Medication Instructions:  ?Your physician has recommended you make the following change in your medication:  ?Start Furosemide 20 mg once daily ?Weigh daily and record weights ? ?*If you need a refill on your cardiac medications before your next appointment, please call your pharmacy* ? ? ?Lab Work: ?Your physician recommends that you return for lab work in: 1 week for BMP and ProBNP. Lab opens at 8, you do not need an appointment for this. You do not need to be fasting for these labs. ? ?If you have labs (blood work) drawn today and your tests are completely normal, you will receive your results only by: ?MyChart Message (if you have MyChart) OR ?A paper copy in the mail ?If you have any lab test that is abnormal or we need to change your treatment, we will call you to review the results. ? ? ?Testing/Procedures: ?NONE ? ? ?Follow-Up: ?At [facility], you and your health needs are our priority.  As part of our continuing mission to provide you with exceptional heart care, we have created designated Provider Care Teams.  These Care Teams include your primary Cardiologist (physician) and Advanced Practice Providers (APPs -  Physician Assistants and Nurse Practitioners) who all work together to provide you with the care you need, when you need it. ? ?We recommend signing up for the patient portal called "MyChart".  Sign up information is provided on this After Visit Summary.  MyChart is used to connect with patients for Virtual Visits (Telemedicine).  Patients are able to view lab/test results, encounter notes, upcoming appointments, etc.  Non-urgent messages can be sent to your provider as well.   ?To learn more about what you can do with MyChart, go to [email or web address].   ? ?Your next appointment:   ?6 week(s) ? ?The format for your next appointment:   ?In Person ? ?Provider:   ?Shirlee More, MD  ? ? ?Other Instructions ?  ?

## 2021-11-03 DIAGNOSIS — R0602 Shortness of breath: Secondary | ICD-10-CM | POA: Diagnosis not present

## 2021-11-03 DIAGNOSIS — Z01812 Encounter for preprocedural laboratory examination: Secondary | ICD-10-CM | POA: Diagnosis not present

## 2021-11-03 DIAGNOSIS — I10 Essential (primary) hypertension: Secondary | ICD-10-CM | POA: Diagnosis not present

## 2021-11-03 DIAGNOSIS — Z952 Presence of prosthetic heart valve: Secondary | ICD-10-CM | POA: Diagnosis not present

## 2021-11-04 LAB — BASIC METABOLIC PANEL
BUN/Creatinine Ratio: 29 — ABNORMAL HIGH (ref 12–28)
BUN: 29 mg/dL — ABNORMAL HIGH (ref 8–27)
CO2: 23 mmol/L (ref 20–29)
Calcium: 9.4 mg/dL (ref 8.7–10.3)
Chloride: 99 mmol/L (ref 96–106)
Creatinine, Ser: 1.01 mg/dL — ABNORMAL HIGH (ref 0.57–1.00)
Glucose: 146 mg/dL — ABNORMAL HIGH (ref 70–99)
Potassium: 4.5 mmol/L (ref 3.5–5.2)
Sodium: 136 mmol/L (ref 134–144)
eGFR: 57 mL/min/{1.73_m2} — ABNORMAL LOW (ref >59)

## 2021-11-04 LAB — PRO B NATRIURETIC PEPTIDE: NT-Pro BNP: 81 pg/mL (ref 0–738)

## 2021-11-06 ENCOUNTER — Telehealth: Payer: Self-pay

## 2021-11-06 NOTE — Telephone Encounter (Signed)
Patient notified of results.

## 2021-11-06 NOTE — Telephone Encounter (Signed)
-----   Message from Richardo Priest, MD sent at 11/04/2021  9:21 PM EDT ----- ?Normal or stable result ? ?No change in treatment ?

## 2021-11-07 ENCOUNTER — Telehealth: Payer: Self-pay

## 2021-11-07 DIAGNOSIS — G5602 Carpal tunnel syndrome, left upper limb: Secondary | ICD-10-CM | POA: Diagnosis not present

## 2021-11-07 NOTE — Telephone Encounter (Signed)
? ?  Pre-operative Risk Assessment  ?  ?Patient Name: Judith Solis  ?DOB: 02/17/1946 ?MRN: [id number]  ? ?  ? ?Request for Surgical Clearance   ? ?Procedure:   Left carpal tunnel release ? ?Date of Surgery:  Clearance TBD                              ?   ?Surgeon:  Dr. Creig Hines ?Surgeon's Group or Practice Name:  [facility] ?Phone number:  [phone] ?Fax number:  [phone] Attention [name] ?  ?Type of Clearance Requested:   ?- Medical  ?  ?Type of Anesthesia:   light sedation and nerve bloack ?  ?Additional requests/questions:   ? ?Signed, ?Helix Lafontaine Tressa Busman   ?11/07/2021, 1:14 PM  ? ?

## 2021-11-07 NOTE — Telephone Encounter (Signed)
? ?  Patient Name: Judith Solis  ?DOB: 10-27-1945 ?MRN: [id number] ? ?Primary Cardiologist: Shirlee More, MD ? ?Chart reviewed as part of pre-operative protocol coverage. Per Dr. Bettina Gavia, carpal tunnel is a very low risk procedure and patient may proceed as planned. ? ?Will route this bundled recommendation to requesting provider via Epic fax function. Please call with questions. ? ?Charlie Pitter, PA-C ?11/07/2021, 5:00 PM ? ? ?

## 2021-11-07 NOTE — Telephone Encounter (Signed)
? ?  Patient Name: Judith Solis  ?DOB: Dec 22, 1945 ?MRN: [id number] ? ?Primary Cardiologist: Shirlee More, MD ? ?Chart reviewed as part of pre-operative protocol coverage. Patient just saw Dr. Bettina Gavia 10/26/21 for recent visit at which time he felt she had hypertensive heart disease with heart failure, placed her on a diuretic with a plan for repeat labs and follow-up scheduled in May. Will route to Dr. Bettina Gavia for input on whether patient can proceed with left carpal tunnel release as requested or if he'd like to see her back 12/08/21 in follow-up to formally evaluate for preop at that time. No specific meds listed to hold. Dr. Bettina Gavia - Please route response to P CV DIV PREOP (the pre-op pool). Thank you. ? ? ?Charlie Pitter, PA-C ?11/07/2021, 2:44 PM ? ? ?

## 2021-11-15 DIAGNOSIS — G5602 Carpal tunnel syndrome, left upper limb: Secondary | ICD-10-CM | POA: Diagnosis not present

## 2021-11-15 DIAGNOSIS — I10 Essential (primary) hypertension: Secondary | ICD-10-CM | POA: Diagnosis not present

## 2021-11-15 DIAGNOSIS — F17210 Nicotine dependence, cigarettes, uncomplicated: Secondary | ICD-10-CM | POA: Diagnosis not present

## 2021-11-20 DIAGNOSIS — Z20822 Contact with and (suspected) exposure to covid-19: Secondary | ICD-10-CM | POA: Diagnosis not present

## 2021-12-08 ENCOUNTER — Ambulatory Visit (INDEPENDENT_AMBULATORY_CARE_PROVIDER_SITE_OTHER): Payer: Medicare Other | Admitting: Cardiology

## 2021-12-08 ENCOUNTER — Encounter: Payer: Self-pay | Admitting: Cardiology

## 2021-12-08 VITALS — BP 126/82 | HR 79 | Ht 65.0 in | Wt 217.2 lb

## 2021-12-08 DIAGNOSIS — I447 Left bundle-branch block, unspecified: Secondary | ICD-10-CM

## 2021-12-08 DIAGNOSIS — Z952 Presence of prosthetic heart valve: Secondary | ICD-10-CM | POA: Diagnosis not present

## 2021-12-08 DIAGNOSIS — I110 Hypertensive heart disease with heart failure: Secondary | ICD-10-CM | POA: Diagnosis not present

## 2021-12-08 DIAGNOSIS — I5042 Chronic combined systolic (congestive) and diastolic (congestive) heart failure: Secondary | ICD-10-CM

## 2021-12-08 DIAGNOSIS — E7800 Pure hypercholesterolemia, unspecified: Secondary | ICD-10-CM

## 2021-12-08 MED ORDER — ENTRESTO 49-51 MG PO TABS: 1.0000 | ORAL_TABLET | Freq: Two times a day (BID) | ORAL | 3 refills | Status: DC

## 2021-12-08 NOTE — Patient Instructions (Signed)
Medication Instructions:  Your physician has recommended you make the following change in your medication:   STOP: Valsartan START: Entresto 49/51 1 tablet twice daily  *If you need a refill on your cardiac medications before your next appointment, please call your pharmacy*   Lab Work: Your physician recommends that you return for lab work in:   Labs today: BMP, Pro BNP  If you have labs (blood work) drawn today and your tests are completely normal, you will receive your results only by: MyChart Message (if you have MyChart) OR A paper copy in the mail If you have any lab test that is abnormal or we need to change your treatment, we will call you to review the results.   Testing/Procedures: None   Follow-Up: At [facility], you and your health needs are our priority.  As part of our continuing mission to provide you with exceptional heart care, we have created designated Provider Care Teams.  These Care Teams include your primary Cardiologist (physician) and Advanced Practice Providers (APPs -  Physician Assistants and Nurse Practitioners) who all work together to provide you with the care you need, when you need it.  We recommend signing up for the patient portal called "MyChart".  Sign up information is provided on this After Visit Summary.  MyChart is used to connect with patients for Virtual Visits (Telemedicine).  Patients are able to view lab/test results, encounter notes, upcoming appointments, etc.  Non-urgent messages can be sent to your provider as well.   To learn more about what you can do with MyChart, go to [email or web address].    Your next appointment:   3 month(s)  The format for your next appointment:   In Person  Provider:   Shirlee More, MD   Other Instructions None  Important Information About Sugar

## 2021-12-08 NOTE — Progress Notes (Signed)
Cardiology Office Note:    Date:  12/08/2021   ID:  Judith Solis, [place] Jan 03, 1946, MRN [id number]  PCP:  Ronita Hipps, MD  Cardiologist:  Shirlee More, MD    Referring MD: Ronita Hipps, MD    ASSESSMENT:    1. Hypertensive heart disease with chronic combined systolic and diastolic congestive heart failure ([place])   2. S/P TAVR (transcatheter aortic valve replacement)   3. Pure hypercholesterolemia   4. Left bundle branch block    PLAN:    In order of problems listed above:  Unfortunately despite a good result from TAVR she has mildly reduced ejection fraction with left bundle branch block symptoms of heart failure and despite ARB and diuretic remains symptomatic regarding transition to Entresto reassess in 3 months.  Unfortunately does not fulfill criteria for CRT Stable good result from TAVR is Continue her statin     Next appointment: 3 weeks   Medication Adjustments/Labs and Tests Ordered: Current medicines are reviewed at length with the patient today.  Concerns regarding medicines are outlined above.  No orders of the defined types were placed in this encounter.  No orders of the defined types were placed in this encounter.   Chief Complaint  Patient presents with   Follow-up   Congestive Heart Failure    History of Present Illness:    Judith Solis is a 76 y.o. female with a hx of  severe aortic stenosis with TAVR hypertensive heart disease hyperlipidemia left bundle branch block. An echocardiogram performed 11/08/2020 ejection fraction is mildly reduced 40 to 45% with grade 1 diastolic dysfunction.  Right ventricle is normal in size function and normal pulmonary artery pressure.  She had moderate mitral annular calcification mild to moderate mitral regurgitation and no stenosis.  TAVR function was normal mean gradient 14 mmHg.  She was seen 09/08/2021 with increasing shortness of breath suggesting heart failure and she was last seen 10/26/2021.  Echocardiogram  09/11/2021 showed similar mild LV dysfunction EF 45 to 50% with mild concentric LVH and grade 1 diastolic dysfunction right ventricle is normal size function and pulmonary artery pressure normal TAVR function was exhibited and there is mild mitral regurgitation.   Compliance with diet, lifestyle and medications: Yes  She is a bit frustrated she takes her diuretic her weight is down 2-1/2 to 3 pounds she has no edema but still short of breath when she tries to do garden work outdoors has to stop and rest recover She has no edema orthopnea chest pain palpitation or syncope Previously unable to afford Entresto we will use valsartan but she has a new Dispensing optician. Regardless she wants to feel better and she said she will fill the prescription and will discontinue ARB and transition to [facility] Past Medical History:  Diagnosis Date   Aortic valve regurgitation    Arthritis    Asthma    Breast cancer ([place])    a. s/p chemo and radiation   Family history of breast cancer 01/10/2021   GERD (gastroesophageal reflux disease)    Heart disease    Heart failure (HCC)    Hyperlipidemia    Hypertension    Morbid obesity ([place])    Restless leg syndrome    S/P TAVR (transcatheter aortic valve replacement) 03/04/2018   26 mm Edwards Sapien 3 transcatheter heart valve placed via percutaneous right transfemoral approach    Severe aortic stenosis     Past Surgical History:  Procedure Laterality Date   ABDOMINAL HYSTERECTOMY  BELPHAROPTOSIS REPAIR Bilateral    BREAST SURGERY     LUMPECTOMY WITH CHEMO & RADIATION   CATARACT EXTRACTION     EYE SURGERY     INTRAOPERATIVE TRANSTHORACIC ECHOCARDIOGRAM  03/04/2018   Procedure: INTRAOPERATIVE TRANSTHORACIC ECHOCARDIOGRAM;  Surgeon: Sherren Mocha, MD;  Location: [facility];  Service: Open Heart Surgery;;   JOINT REPLACEMENT     BILATERAL HIPS 2007 & 2008   RIGHT/LEFT HEART CATH AND CORONARY ANGIOGRAPHY N/A 01/31/2018   Procedure: RIGHT/LEFT HEART CATH AND  CORONARY ANGIOGRAPHY;  Surgeon: Burnell Blanks, MD;  Location: [facility];  Service: Cardiovascular;  Laterality: N/A;   ROTATOR CUFF REPAIR     TONSILLECTOMY     TRANSCATHETER AORTIC VALVE REPLACEMENT, TRANSFEMORAL N/A 03/04/2018   Procedure: TRANSCATHETER AORTIC VALVE REPLACEMENT, TRANSFEMORAL. EDWARDS 26MM SAPIEN 3 TRANSCATHETER HEART VALVE.;  Surgeon: Sherren Mocha, MD;  Location: [facility];  Service: Open Heart Surgery;  Laterality: N/A;    Current Medications: Current Meds  Medication Sig   aspirin EC 81 MG tablet Take 1 tablet (81 mg total) by mouth daily. Swallow whole.   atorvastatin (LIPITOR) 10 MG tablet Take 10 mg by mouth at bedtime.    CALCIUM CITRATE PO Take 2,000 Units by mouth 2 (two) times daily.   Cholecalciferol (VITAMIN D3) 50 MCG (2000 UT) TABS Take 1 tablet by mouth daily.   citalopram (CELEXA) 10 MG tablet Take 20 mg by mouth at bedtime.   clobetasol cream (TEMOVATE) 3.35 % Apply 1 application topically 2 (two) times daily as needed (vaginal irritation).   estradiol (ESTRACE) 0.1 MG/GM vaginal cream Place 0.1 Applicatorfuls vaginally 3 (three) times a week.   furosemide (LASIX) 20 MG tablet Take 1 tablet (20 mg total) by mouth daily.   magnesium gluconate (MAGONATE) 500 MG tablet Take 500 mg by mouth daily.   Multiple Vitamin (MULTIVITAMIN) tablet Take 1 tablet by mouth daily.   pantoprazole (PROTONIX) 40 MG tablet Take 40 mg by mouth daily.   Probiotic Product (PROBIOTIC-10 PO) Take 1 tablet by mouth every other day.   valsartan (DIOVAN) 40 MG tablet Take 1 tablet (40 mg total) by mouth 2 (two) times daily.     Allergies:   Plavix [clopidogrel bisulfate], Codeine, and Oxycodone-acetaminophen   Social History   Socioeconomic History   Marital status: Widowed    Spouse name: Not on file   Number of children: 1   Years of education: Not on file   Highest education level: GED or equivalent  Occupational History   Not on file  Tobacco Use    Smoking status: Former    Packs/day: 1.50    Years: 20.00    Pack years: 30.00    Types: Cigarettes    Quit date: 06/04/1990    Years since quitting: 31.5    Passive exposure: Past   Smokeless tobacco: Never  Vaping Use   Vaping Use: Never used  Substance and Sexual Activity   Alcohol use: No    Comment: none since 1985   Drug use: No   Sexual activity: Not on file  Other Topics Concern   Not on file  Social History Narrative   Lives at home alone   Right handed   Caffeine: 1-2 cups/day of coffee   Social Determinants of Health   Financial Resource Strain: Not on file  Food Insecurity: No Food Insecurity   Worried About Running Out of Food in the Last Year: Never true   [place] in the Last  Year: Never true  Transportation Needs: No Transportation Needs   Lack of Transportation (Medical): No   Lack of Transportation (Non-Medical): No  Physical Activity: Not on file  Stress: Not on file  Social Connections: Not on file     Family History: The patient's family history includes Bladder Cancer (age of onset: 62) in her sister; Breast cancer in her cousin and cousin; Breast cancer (age of onset: 86) in her sister; Breast cancer (age of onset: 40) in her sister; Cancer in her niece; Diabetes Mellitus II in her paternal grandfather, sister, and sister; Heart Problems in her brother and sister; Heart disease in her brother, maternal grandfather, and mother; Hepatitis C in her sister; Kidney cancer in her niece; Lung cancer in her cousin and paternal uncle; Lung cancer (age of onset: 31) in her sister; Stroke in her father; Throat cancer in her sister. There is no history of Sleep apnea. ROS:   Please see the history of present illness.    All other systems reviewed and are negative.  EKGs/Labs/Other Studies Reviewed:    The following studies were reviewed today:  EKG:  EKG ordered today and personally reviewed.  The ekg ordered today demonstrates sinus rhythm left bundle  branch block  Recent Labs: 09/08/2021: TSH 2.450 09/25/2021: ALT 39; Hemoglobin 14.0; Platelets 250 11/03/2021: BUN 29; Creatinine, Ser 1.01; NT-Pro BNP 81; Potassium 4.5; Sodium 136  Recent Lipid Panel    Component Value Date/Time   CHOL 176 09/08/2021 1348   TRIG 275 (H) 09/08/2021 1348   HDL 34 (L) 09/08/2021 1348   CHOLHDL 5.2 (H) 09/08/2021 1348   LDLCALC 96 09/08/2021 1348    Physical Exam:    VS:  BP 126/82 (BP Location: Right Arm, Patient Position: Sitting)   Pulse 79   Ht '5\' 5"'$  (1.651 m)   Wt 217 lb 3.2 oz (98.5 kg)   SpO2 95%   BMI 36.14 kg/m     Wt Readings from Last 3 Encounters:  12/08/21 217 lb 3.2 oz (98.5 kg)  10/26/21 217 lb (98.4 kg)  09/25/21 214 lb 1.6 oz (97.1 kg)     GEN:  Well nourished, well developed in no acute distress HEENT: Normal NECK: No JVD; No carotid bruits LYMPHATICS: No lymphadenopathy CARDIAC: Paradoxical second heart sound RRR, no murmurs, rubs, gallops RESPIRATORY:  Clear to auscultation without rales, wheezing or rhonchi  ABDOMEN: Soft, non-tender, non-distended MUSCULOSKELETAL:  No edema; No deformity  SKIN: Warm and dry NEUROLOGIC:  Alert and oriented x 3 PSYCHIATRIC:  Normal affect    Signed, Shirlee More, MD  12/08/2021 9:59 AM    [facility]

## 2021-12-09 LAB — BASIC METABOLIC PANEL
BUN/Creatinine Ratio: 23 (ref 12–28)
BUN: 21 mg/dL (ref 8–27)
CO2: 23 mmol/L (ref 20–29)
Calcium: 9.6 mg/dL (ref 8.7–10.3)
Chloride: 98 mmol/L (ref 96–106)
Creatinine, Ser: 0.91 mg/dL (ref 0.57–1.00)
Glucose: 113 mg/dL — ABNORMAL HIGH (ref 70–99)
Potassium: 4.5 mmol/L (ref 3.5–5.2)
Sodium: 136 mmol/L (ref 134–144)
eGFR: 65 mL/min/{1.73_m2} (ref >59)

## 2021-12-09 LAB — PRO B NATRIURETIC PEPTIDE: NT-Pro BNP: 158 pg/mL (ref 0–738)

## 2021-12-13 ENCOUNTER — Telehealth: Payer: Self-pay | Admitting: Cardiology

## 2021-12-13 MED ORDER — VALSARTAN 40 MG PO TABS
40.0000 mg | ORAL_TABLET | Freq: Two times a day (BID) | ORAL | 3 refills | Status: DC
Start: 2021-12-13 — End: 2023-01-11

## 2021-12-13 NOTE — Telephone Encounter (Signed)
See result note.  

## 2021-12-13 NOTE — Telephone Encounter (Signed)
Pt called back to speak to RN in regards to Chart Review Labs from 5/19. Transferred to Truddie Hidden, RN.

## 2021-12-13 NOTE — Addendum Note (Signed)
Addended by: Truddie Hidden on: 12/13/2021 03:26 PM   Modules accepted: Orders

## 2021-12-25 ENCOUNTER — Other Ambulatory Visit: Payer: Self-pay | Admitting: *Deleted

## 2021-12-25 NOTE — Patient Instructions (Signed)
Visit Information  Thank you for taking time to visit with me today. Please don't hesitate to contact me if I can be of assistance to you before our next scheduled telephone appointment.  Following are the goals we discussed today:  Current Barriers:  Knowledge Deficits related to plan of care for management of HTN   RNCM Clinical Goal(s):  Patient will verbalize understanding of plan for management of HTN as evidenced by Continuation of monitoring blood pressure and adhering to low sodium diet through collaboration with RN Care manager, provider, and care team.   Interventions: Inter-disciplinary care team collaboration (see longitudinal plan of care) Evaluation of current treatment plan related to  self management and patient's adherence to plan as established by provider RN provided a Matter of Choice Blood Pressure Control RN provided education on chair exercises RN sent educational material on How to monitor your blood pressure   Patient Goals/Self-Care Activities: Take medications as prescribed   Attend all scheduled provider appointments Call pharmacy for medication refills 3-7 days in advance of running out of medications Attend church or other social activities Perform all self care activities independently  Perform IADL's (shopping, preparing meals, housekeeping, managing finances) independently Call provider office for new concerns or questions  check blood pressure 3 times per week learn about high blood pressure take blood pressure log to all doctor appointments call doctor for signs and symptoms of high blood pressure develop an action plan for high blood pressure keep all doctor appointments take medications for blood pressure exactly as prescribed begin an exercise program report new symptoms to your doctor eat more whole grains, fruits and vegetables, lean meats and healthy fats limit salt intake to 2300 mg/day   [id number] Per patient her blood pressure readings  are doing much better. He is monitoring his blood pressure twice a week.  She is taking her medications as per ordered. Her appetite is good. She has not started an exercise routine. RN discussed possibly joining a Naval architect or looking at [employer] live video classes.   Our next appointment is by telephone on April 03, 2022  Please call Johny Shock [phone] if you need to cancel or reschedule your appointment.   Please call the Suicide and Crisis Lifeline: 988 if you are experiencing a Mental Health or [place] or need someone to talk to.  The patient verbalized understanding of instructions, educational materials, and care plan provided today and agreed to receive a mailed copy of patient instructions, educational materials, and care plan.   Telephone follow up appointment with care management team member scheduled for: The patient has been provided with contact information for the care management team and has been advised to call with any health related questions or concerns.   [place] [facility] [phone]

## 2021-12-25 NOTE — Patient Outreach (Signed)
[facility]) Care Management [place] Note   12/25/2021 Name:  Judith Solis MRN:  [id number] DOB:  02-Sep-1945  Summary: Blood pressure readings are better. Patient does home monitoring twice a week. Patient has not started her exercise routine. Her appetite is good. Adhering to low sodium diet.    Recommendations/Changes made from today's visit: Check out the local [employer] Start exercise routine Medication adherence Happy Birthday   Subjective: Judith Solis is an 76 y.o. year old female who is a primary patient of Ronita Hipps, MD. The care management team was consulted for assistance with care management and/or care coordination needs.    RN Health Coach completed Telephone Visit today.   Objective:  Medications Reviewed Today     Reviewed by Toni Arthurs, CMA (Certified Medical Assistant) on 12/08/21 at [phone]  Med List Status: <None>   Medication Order Taking? Sig Documenting Provider Last Dose Status Informant  aspirin EC 81 MG tablet [id number] Yes Take 1 tablet (81 mg total) by mouth daily. Swallow whole. Richardo Priest, MD Taking Active   atorvastatin (LIPITOR) 10 MG tablet [id number] Yes Take 10 mg by mouth at bedtime.  [provider] Taking Active Self           Med Note Kenton Kingfisher, ABBIE R   Fri May 10, 2017  8:17 AM)    CALCIUM CITRATE PO [id number] Yes Take 2,000 Units by mouth 2 (two) times daily. [provider] Taking Active   Cholecalciferol (VITAMIN D3) 50 MCG (2000 UT) TABS [id number] Yes Take 1 tablet by mouth daily. [provider] Taking Active   citalopram (CELEXA) 10 MG tablet [id number] Yes Take 20 mg by mouth at bedtime. [provider] Taking Active   clobetasol cream (TEMOVATE) 0.05 % [id number] Yes Apply 1 application topically 2 (two) times daily as needed (vaginal irritation). [provider] Taking Active Self  estradiol (ESTRACE) 0.1 MG/GM vaginal cream [id number] Yes Place 0.1  Applicatorfuls vaginally 3 (three) times a week. [provider] Taking Active   furosemide (LASIX) 20 MG tablet [id number] Yes Take 1 tablet (20 mg total) by mouth daily. Richardo Priest, MD Taking Active   magnesium gluconate (MAGONATE) 500 MG tablet [id number] Yes Take 500 mg by mouth daily. [provider] Taking Active   Multiple Vitamin (MULTIVITAMIN) tablet [id number] Yes Take 1 tablet by mouth daily. [provider] Taking Active   pantoprazole (PROTONIX) 40 MG tablet [id number] Yes Take 40 mg by mouth daily. [provider] Taking Active Self  Probiotic Product (PROBIOTIC-10 PO) [id number] Yes Take 1 tablet by mouth every other day. [provider] Taking Active            Med Note Gildardo Griffes   Thu Mar 23, 2021  8:59 AM) Generic culturelle   valsartan (DIOVAN) 40 MG tablet [id number] Yes Take 1 tablet (40 mg total) by mouth 2 (two) times daily. Richardo Priest, MD Taking Active              SDOH:  (Social Determinants of Health) assessments and interventions performed:    Care Plan  Review of patient past medical history, allergies, medications, health status, including review of consultants reports, laboratory and other test data, was performed as part of comprehensive evaluation for care management services.   Care Plan : RN Care Manager Plan of Care  Updates made by Elasha Tess, Eppie Gibson, RN since 12/25/2021 12:00 AM     Problem: Knowledge Deficit related  to Hypertension and Care Coordination needs   Priority: High     Long-Range Goal: Development Plan of Care for Management of Hypertension   Start Date: 06/28/2021  Expected End Date: 07/21/2022  Priority: High  Note:   Current Barriers:  Knowledge Deficits related to plan of care for management of HTN   RNCM Clinical Goal(s):  Patient will verbalize understanding of plan for management of HTN as evidenced by Continuation of monitoring blood pressure and adhering to low  sodium diet  through collaboration with RN Care manager, provider, and care team.   Interventions: Inter-disciplinary care team collaboration (see longitudinal plan of care) Evaluation of current treatment plan related to  self management and patient's adherence to plan as established by provider RN provided a Matter of Choice Blood Pressure Control RN provided education on chair exercises RN sent educational material on How to monitor your blood pressure   Patient Goals/Self-Care Activities: Take medications as prescribed   Attend all scheduled provider appointments Call pharmacy for medication refills 3-7 days in advance of running out of medications Attend church or other social activities Perform all self care activities independently  Perform IADL's (shopping, preparing meals, housekeeping, managing finances) independently Call provider office for new concerns or questions  check blood pressure 3 times per week learn about high blood pressure take blood pressure log to all doctor appointments call doctor for signs and symptoms of high blood pressure develop an action plan for high blood pressure keep all doctor appointments take medications for blood pressure exactly as prescribed begin an exercise program report new symptoms to your doctor eat more whole grains, fruits and vegetables, lean meats and healthy fats limit salt intake to 2300 mg/day   [id number] Per patient her blood pressure readings are doing much better. He is monitoring his blood pressure twice a week.  She is taking her medications as per ordered. Her appetite is good. She has not started an exercise routine. RN discussed possibly joining a Naval architect or looking at [employer] live video classes.       Plan: Telephone follow up appointment with care management team member scheduled for:  April 03, 2022 The patient has been provided with contact information for the care management team and has been advised to call  with any health related questions or concerns.   [place] Care Management [phone]

## 2022-02-13 ENCOUNTER — Other Ambulatory Visit: Payer: Self-pay | Admitting: *Deleted

## 2022-02-13 ENCOUNTER — Ambulatory Visit: Payer: Self-pay | Admitting: *Deleted

## 2022-02-13 NOTE — Patient Outreach (Signed)
[facility]) Care Management  02/13/2022  Judith Solis 1946-03-05 [id number]   RN Health Coach telephone call to patient.  [employer] compliance verified. [id number] Per patient her blood pressure is doing much better and is stable. Patient stated she is exercising around the house. She checked and her insurance does not cover for her to go to the [facility]. RN Health Coach case closure.  Plan  RN Health Coach Case Closure  [place] Care Management [phone]

## 2022-02-14 NOTE — Patient Outreach (Signed)
[facility]) Care Management  02/14/2022  Judith Solis [birth year] [id number]  RN Health Coach telephone call to patient.  [employer] compliance verified. Per patient her blood pressure is better controlled.  Patient is monitoring her blood pressure at home. She has not been able to join the gym. [facility] [employer] [facility] not take her insurance. RN Health Coach will close case patient has met her goal.  [place] Management [phone]

## 2022-02-21 DIAGNOSIS — J329 Chronic sinusitis, unspecified: Secondary | ICD-10-CM | POA: Diagnosis not present

## 2022-02-21 DIAGNOSIS — Z6834 Body mass index (BMI) 34.0-34.9, adult: Secondary | ICD-10-CM | POA: Diagnosis not present

## 2022-02-21 DIAGNOSIS — J40 Bronchitis, not specified as acute or chronic: Secondary | ICD-10-CM | POA: Diagnosis not present

## 2022-02-25 NOTE — Progress Notes (Signed)
Cardiology Office Note:    Date:  02/26/2022   ID:  [name], [place] 20-Feb-1946, MRN [id number]  PCP:  Ronita Hipps, MD  Cardiologist:  Shirlee More, MD    Referring MD: Ronita Hipps, MD    ASSESSMENT:    1. Hypertensive heart disease with chronic combined systolic and diastolic congestive heart failure ([place])   2. S/P TAVR (transcatheter aortic valve replacement)   3. Pure hypercholesterolemia    PLAN:    In order of problems listed above:  She is improved she is cleared her peripheral edema I went back and looked at her echocardiogram again no indication of cardiac amyloidosis I think she is on good medical therapy at this time.  Looking at her medication she may be taking too ARB since that is the case she will stop losartan.  We will recheck her renal function proBNP today. Stable course no evidence of valve dysfunction I reviewed her echocardiogram and no findings to suggest cardiac amyloidosis superimposed on her aortic stenosis Continue her statin Primary problem is respiratory infection probably best characterized as bronchitis see my changes below   Next appointment: 6 months   Medication Adjustments/Labs and Tests Ordered: Current medicines are reviewed at length with the patient today.  Concerns regarding medicines are outlined above.  No orders of the defined types were placed in this encounter.  No orders of the defined types were placed in this encounter.   Chief Complaint  Patient presents with   Follow-up   Congestive Heart Failure    History of Present Illness:    Judith Solis is a 76 y.o. female with a hx of severe symptomatic aortic stenosis with TAVR hypertensive heart disease with heart failure left bundle branch block hyperlipidemia calcific mitral valve disease with mild to moderate mitral regurgitation last seen 12/08/2021.  She remains short of breath and was transitioned from ARB to [place].  Echocardiogram 09/11/2021 showed similar mild  LV dysfunction EF 45 to 50% with mild concentric LVH and grade 1 diastolic dysfunction right ventricle is normal size function and pulmonary artery pressure normal TAVR function was normaland there is mild mitral regurgitation.    Compliance with diet, lifestyle and medications: Yes The last 2 weeks she has had a respiratory infection initially fever cough very thick sputum COVID-negative took Zithromax still has difficulty bringing up sputum and is wheezing. She is using Mucinex DM I asked her to start using her inhaler at least twice a day she said she is wheezing and also asked her to purchase and start using a flutter valve. She did not have a chest x-ray done Her weight has been stable or shortness of breath until the respiratory infection was doing quite well she has no edema orthopnea chest pain palpitation or syncope She developed severe pruritus with Delene Loll and is now on an ARB Overall she is pleased with her cardiac care Past Medical History:  Diagnosis Date   Aortic valve regurgitation    Arthritis    Asthma    Breast cancer ([place])    a. 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/04/2018   26 mm Edwards Sapien 3 transcatheter heart valve placed via percutaneous right transfemoral  approach    Severe aortic stenosis     Past Surgical History:  Procedure Laterality Date   ABDOMINAL HYSTERECTOMY     BELPHAROPTOSIS REPAIR Bilateral    BREAST SURGERY     LUMPECTOMY WITH CHEMO & RADIATION   CATARACT EXTRACTION     EYE SURGERY     INTRAOPERATIVE TRANSTHORACIC ECHOCARDIOGRAM  03/04/2018   Procedure: INTRAOPERATIVE TRANSTHORACIC ECHOCARDIOGRAM;  Surgeon: Sherren Mocha, MD;  Location: [facility];  Service: Open Heart Surgery;;   JOINT  REPLACEMENT     BILATERAL HIPS 2007 & 2008   RIGHT/LEFT HEART CATH AND CORONARY ANGIOGRAPHY N/A 01/31/2018   Procedure: RIGHT/LEFT HEART CATH AND CORONARY ANGIOGRAPHY;  Surgeon: Burnell Blanks, MD;  Location: [facility];  Service: Cardiovascular;  Laterality: N/A;   ROTATOR CUFF REPAIR     TONSILLECTOMY     TRANSCATHETER AORTIC VALVE REPLACEMENT, TRANSFEMORAL N/A 03/04/2018   Procedure: TRANSCATHETER AORTIC VALVE REPLACEMENT, TRANSFEMORAL. EDWARDS 26MM SAPIEN 3 TRANSCATHETER HEART VALVE.;  Surgeon: Sherren Mocha, MD;  Location: [facility];  Service: Open Heart Surgery;  Laterality: N/A;    Current Medications: Current Meds  Medication Sig   aspirin EC 81 MG tablet Take 1 tablet (81 mg total) by mouth daily. Swallow whole.   atorvastatin (LIPITOR) 10 MG tablet Take 10 mg by mouth at bedtime.    CALCIUM CITRATE PO Take 2,000 Units by mouth 2 (two) times daily.   Cholecalciferol (VITAMIN D3) 50 MCG (2000 UT) TABS Take 1 tablet by mouth daily.   citalopram (CELEXA) 20 MG tablet Take 20 mg by mouth daily.   clobetasol cream (TEMOVATE) 9.24 % Apply 1 application topically 2 (two) times daily as needed (vaginal irritation).   estradiol (ESTRACE) 0.1 MG/GM vaginal cream Place 0.1 Applicatorfuls vaginally 3 (three) times a week.   furosemide (LASIX) 20 MG tablet Take 1 tablet (20 mg total) by mouth daily.   losartan-hydrochlorothiazide (HYZAAR) 50-12.5 MG tablet Take 1 tablet by mouth daily.   magnesium gluconate (MAGONATE) 500 MG tablet Take 500 mg by mouth daily.   Multiple Vitamin (MULTIVITAMIN) tablet Take 1 tablet by mouth daily.   pantoprazole (PROTONIX) 40 MG tablet Take 40 mg by mouth daily.   Probiotic Product (PROBIOTIC-10 PO) Take 1 tablet by mouth every other day.   valsartan (DIOVAN) 40 MG tablet Take 1 tablet (40 mg total) by mouth 2 (two) times daily.     Allergies:   Entresto [sacubitril-valsartan], Plavix [clopidogrel bisulfate], Codeine, and Oxycodone-acetaminophen    Social History   Socioeconomic History   Marital status: Widowed    Spouse name: Not on file   Number of children: 1   Years of education: Not on file   Highest education level: GED or equivalent  Occupational History   Not on file  Tobacco Use   Smoking status: Former    Packs/day: 1.50    Years: 20.00    Total pack years: 30.00    Types: Cigarettes    Quit date: 06/04/1990    Years since quitting: 31.7    Passive exposure: Past   Smokeless tobacco: Never  Vaping Use   Vaping Use: Never used  Substance and Sexual Activity   Alcohol use: No    Comment: none since 1985   Drug use: No   Sexual activity: Not on file  Other Topics Concern   Not on file  Social History Narrative   Lives at home alone   Right handed   Caffeine: 1-2 cups/day of coffee  Social Determinants of Health   Financial Resource Strain: Not on file  Food Insecurity: No Food Insecurity (06/28/2021)   Hunger Vital Sign    Worried About Running Out of Food in the Last Year: Never true    Ran Out of Food in the Last Year: Never true  Transportation Needs: No Transportation Needs (06/28/2021)   PRAPARE - Hydrologist (Medical): No    Lack of Transportation (Non-Medical): No  Physical Activity: Not on file  Stress: Not on file  Social Connections: Not on file     Family History: The patient's family history includes Bladder Cancer (age of onset: 18) in her sister; Breast cancer in her cousin and cousin; Breast cancer (age of onset: 26) in her sister; Breast cancer (age of onset: 67) in her sister; Cancer in her niece; Diabetes Mellitus II in her paternal grandfather, sister, and sister; Heart Problems in her brother and sister; Heart disease in her brother, maternal grandfather, and mother; Hepatitis C in her sister; Kidney cancer in her niece; Lung cancer in her cousin and paternal uncle; Lung cancer (age of onset: 68) in her sister; Stroke in her father; Throat cancer in  her sister. There is no history of Sleep apnea. ROS:   Please see the history of present illness.    All other systems reviewed and are negative.  EKGs/Labs/Other Studies Reviewed:    The following studies were reviewed today:    Recent Labs: 09/08/2021: TSH 2.450 09/25/2021: ALT 39; Hemoglobin 14.0; Platelets 250 12/08/2021: BUN 21; Creatinine, Ser 0.91; NT-Pro BNP 158; Potassium 4.5; Sodium 136  Recent Lipid Panel    Component Value Date/Time   CHOL 176 09/08/2021 1348   TRIG 275 (H) 09/08/2021 1348   HDL 34 (L) 09/08/2021 1348   CHOLHDL 5.2 (H) 09/08/2021 1348   LDLCALC 96 09/08/2021 1348    Physical Exam:    VS:  BP 108/60 (BP Location: Left Arm, Patient Position: Sitting, Cuff Size: Normal)   Pulse 74   Ht '5\' 5"'$  (1.651 m)   Wt 209 lb (94.8 kg)   SpO2 97%   BMI 34.78 kg/m     Wt Readings from Last 3 Encounters:  02/26/22 209 lb (94.8 kg)  12/08/21 217 lb 3.2 oz (98.5 kg)  10/26/21 217 lb (98.4 kg)     GEN:  Well nourished, well developed in no acute distress HEENT: Normal NECK: No JVD; No carotid bruits LYMPHATICS: No lymphadenopathy CARDIAC: RRR, no murmurs, rubs, gallops RESPIRATORY: Diminished breath sounds not wheezing no rales ABDOMEN: Soft, non-tender, non-distended MUSCULOSKELETAL:  No edema; No deformity  SKIN: Warm and dry NEUROLOGIC:  Alert and oriented x 3 PSYCHIATRIC:  Normal affect    Signed, Shirlee More, MD  02/26/2022 9:46 AM    [place]

## 2022-02-26 ENCOUNTER — Other Ambulatory Visit: Payer: Self-pay

## 2022-02-26 ENCOUNTER — Ambulatory Visit (INDEPENDENT_AMBULATORY_CARE_PROVIDER_SITE_OTHER): Payer: Medicare Other | Admitting: Cardiology

## 2022-02-26 ENCOUNTER — Encounter: Payer: Self-pay | Admitting: Cardiology

## 2022-02-26 VITALS — BP 108/60 | HR 74 | Ht 65.0 in | Wt 209.0 lb

## 2022-02-26 DIAGNOSIS — I110 Hypertensive heart disease with heart failure: Secondary | ICD-10-CM | POA: Diagnosis not present

## 2022-02-26 DIAGNOSIS — E7800 Pure hypercholesterolemia, unspecified: Secondary | ICD-10-CM | POA: Diagnosis not present

## 2022-02-26 DIAGNOSIS — I5042 Chronic combined systolic (congestive) and diastolic (congestive) heart failure: Secondary | ICD-10-CM | POA: Diagnosis not present

## 2022-02-26 DIAGNOSIS — Z952 Presence of prosthetic heart valve: Secondary | ICD-10-CM | POA: Diagnosis not present

## 2022-02-26 NOTE — Patient Instructions (Signed)
Medication Instructions:  Your physician recommends that you continue on your current medications as directed. Please refer to the Current Medication list given to you today.  *If you need a refill on your cardiac medications before your next appointment, please call your pharmacy*   Lab Work: Your physician recommends that you return for lab work in:   Labs today: BMP, Pro BNP  If you have labs (blood work) drawn today and your tests are completely normal, you will receive your results only by: MyChart Message (if you have MyChart) OR A paper copy in the mail If you have any lab test that is abnormal or we need to change your treatment, we will call you to review the results.   Testing/Procedures: None   Follow-Up: At [facility], you and your health needs are our priority.  As part of our continuing mission to provide you with exceptional heart care, we have created designated Provider Care Teams.  These Care Teams include your primary Cardiologist (physician) and Advanced Practice Providers (APPs -  Physician Assistants and Nurse Practitioners) who all work together to provide you with the care you need, when you need it.  We recommend signing up for the patient portal called "MyChart".  Sign up information is provided on this After Visit Summary.  MyChart is used to connect with patients for Virtual Visits (Telemedicine).  Patients are able to view lab/test results, encounter notes, upcoming appointments, etc.  Non-urgent messages can be sent to your provider as well.   To learn more about what you can do with MyChart, go to [email or web address].    Your next appointment:   4 month(s)  The format for your next appointment:   In Person  Provider:   Shirlee More, MD    Other Instructions Order a flutter valve from [facility]  Use your inhaler twice a day  Important Information About Sugar         Heart Failure  Weigh yourself every morning when you first wake  up and record on a calender or note pad, bring this to your office visits. Using a pill tender can help with taking your medications consistently.  Limit your fluid intake to 2 liters daily  Limit your sodium intake to less than 2-3 grams daily. Ask if you need dietary teaching.  If you gain more than 3 pounds (from your dry weight ), double your dose of diuretic for the day.  If you gain more than 5 pounds (from your dry weight), double your dose of lasix and call your heart failure doctor.  Please do not smoke tobacco since it is very bad for your heart.  Please do not drink alcohol since it can worsen your heart failure.Also avoid OTC nonsteroidal drugs, such as advil, aleve and motrin.  Try to exercise for at least 30 minutes every day because this will help your heart be more efficient. You may be eligible for supervised cardiac rehab, ask your physician.    Purchase and use a flutter valve

## 2022-02-27 LAB — PRO B NATRIURETIC PEPTIDE: NT-Pro BNP: 206 pg/mL (ref 0–738)

## 2022-02-27 LAB — BASIC METABOLIC PANEL
BUN/Creatinine Ratio: 24 (ref 12–28)
BUN: 22 mg/dL (ref 8–27)
CO2: 25 mmol/L (ref 20–29)
Calcium: 9.9 mg/dL (ref 8.7–10.3)
Chloride: 100 mmol/L (ref 96–106)
Creatinine, Ser: 0.91 mg/dL (ref 0.57–1.00)
Glucose: 86 mg/dL (ref 70–99)
Potassium: 4.1 mmol/L (ref 3.5–5.2)
Sodium: 136 mmol/L (ref 134–144)
eGFR: 65 mL/min/{1.73_m2} (ref >59)

## 2022-03-05 DIAGNOSIS — J40 Bronchitis, not specified as acute or chronic: Secondary | ICD-10-CM | POA: Diagnosis not present

## 2022-03-05 DIAGNOSIS — J329 Chronic sinusitis, unspecified: Secondary | ICD-10-CM | POA: Diagnosis not present

## 2022-03-15 DIAGNOSIS — N3281 Overactive bladder: Secondary | ICD-10-CM | POA: Diagnosis not present

## 2022-03-15 DIAGNOSIS — N9089 Other specified noninflammatory disorders of vulva and perineum: Secondary | ICD-10-CM | POA: Diagnosis not present

## 2022-03-15 DIAGNOSIS — N952 Postmenopausal atrophic vaginitis: Secondary | ICD-10-CM | POA: Diagnosis not present

## 2022-03-15 DIAGNOSIS — N390 Urinary tract infection, site not specified: Secondary | ICD-10-CM | POA: Diagnosis not present

## 2022-03-22 DIAGNOSIS — Z0000 Encounter for general adult medical examination without abnormal findings: Secondary | ICD-10-CM | POA: Diagnosis not present

## 2022-03-22 DIAGNOSIS — Z6834 Body mass index (BMI) 34.0-34.9, adult: Secondary | ICD-10-CM | POA: Diagnosis not present

## 2022-03-22 DIAGNOSIS — B029 Zoster without complications: Secondary | ICD-10-CM | POA: Diagnosis not present

## 2022-03-28 ENCOUNTER — Ambulatory Visit (INDEPENDENT_AMBULATORY_CARE_PROVIDER_SITE_OTHER): Payer: Medicare Other | Admitting: Neurology

## 2022-03-28 ENCOUNTER — Encounter: Payer: Self-pay | Admitting: Neurology

## 2022-03-28 VITALS — BP 121/80 | HR 86 | Ht 65.0 in | Wt 212.8 lb

## 2022-03-28 DIAGNOSIS — I6381 Other cerebral infarction due to occlusion or stenosis of small artery: Secondary | ICD-10-CM

## 2022-03-28 DIAGNOSIS — Z9989 Dependence on other enabling machines and devices: Secondary | ICD-10-CM | POA: Diagnosis not present

## 2022-03-28 DIAGNOSIS — G4733 Obstructive sleep apnea (adult) (pediatric): Secondary | ICD-10-CM

## 2022-03-28 NOTE — Progress Notes (Signed)
Subjective:    Patient ID: Judith Solis is a 76 y.o. female.  HPI    Interim history:   Judith Solis is a 76 year old right-handed woman with an underlying medical history of hyperlipidemia, breast cancer, reflux disease, aortic stenosis with status post TAVR, hypertension, obstructive sleep apnea, obesity, arthritis, status post bilateral hip replacements, status post lumbar laminectomy, who presents for follow-up consultation of her sleep apnea, on AutoPap therapy.  The patient is a lovely today.  I first met her on 03/23/2021 at the request of her primary care physician, at which time the patient reported nonrestorative sleep and tiredness.  She was on several potentially sedating medications and was advised to talk to her primary care about reducing some of her medications such as her trazodone which was 100 mg at the time.  Her home sleep test through an outside office on 12/27/2018 showed an AHI of 18.4/h, O2 nadir 80%.  She was compliant with her AutoPap therapy at the time. She c/o mental fogginess. We proceeded with a brain MRI. She had a brain MRI with and without contrast on 04/04/2021 and I reviewed the results: Impression: No evidence of acute intracranial abnormality.  Mild to moderate chronic small vessel ischemic changes within the cerebral white matter.  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  Mild generalized cerebral atrophy.  Today, 03/28/2022: I reviewed her AutoPap compliance data from 02/26/2022 through 03/27/2022, which is a total of 30 days, during which time she used her machine every night with percent use days greater than 4 hours at 96.7%, indicating excellent compliance with an average usage of 6 hours and 56 minutes, residual AHI at goal at 3.4/h, 90th percentile pressure at 8.2 cm, leak on the lower side.  Pressure range of 4 to 20 cm.  She reports overall doing quite well with her machine.  She had a cough recently and her PAP usage was decreased at the time.   She had a cold and cough for most of August.  She ended up with 2 different antibiotics and 2 different inhalers and the steroid inhaler helped her the best she feels.  We talked about her MRI report.  She has no history of strokelike symptoms in the past.  She had a lipid panel on 09/08/2021 which showed a triglyceride level of 275 and LDL of 96.  She takes low-dose atorvastatin, she takes a baby aspirin.  She is on blood pressure medication and blood pressure tends to be good at home.  As far as her mental fogginess, she feels improved, she was successful in coming off of trazodone, gabapentin and pramipexole.  She has had no major repercussions, some residual restless leg symptoms but livable.  The patient's allergies, current medications, family history, past medical history, past social history, past surgical history and problem list were reviewed and updated as appropriate.   Previously:   03/23/21: (She) reports not waking up rested, she feels groggy and tired throughout the day, she feels sluggish in her thinking.  This has been going on for about 3 years but worse in the past 1 year.  After she started AutoPap therapy, she felt improved in some of her symptoms.  Of note, she was previously diagnosed with obstructive sleep apnea.   We were able to get records from your office, she had a home sleep test on 12/27/2018 which showed moderate obstructive sleep apnea with an AHI of 18.4/h, O2 nadir 80%. She has been on AutoPap therapy.  I was  able to review her compliance data from 06/27/2020 through 09/24/2020, which is a total of 90 days during which time she used her machine 88 days with percent use days greater than 4 hours at 97.8%, indicating excellent compliance, average usage of 7 hours and 11 minutes, residual AHI 4.5/h, 90th percentile of pressure at 8.7 cm with a range of 4-20.  She reports that she got a replacement in March because of the Respironics recall.  We do not have any compliance data after  September 24, 2020.  It looks like her DME company, [employer] is not connected to her new machine.  She has woken up with a headache. I reviewed your office note from 01/28/2021.  She had blood work through your office at the time and we will request blood test results from your office.  She had CMP, CBC with differential, TSH, lipid panel, and B12 level checked.  As far she knows, her labs were fine.   Her Epworth sleepiness score is 2 out of 24, fatigue severity score is 34 out of 63.  Of note, she is on potentially sedating medications, in particular, she takes trazodone at night, 100 mg strength half a pill to 1 pill at night as needed.  She also takes pramipexole 0.125 mg at bedtime.  She is on Celexa 20 mg daily.  She takes gabapentin 300 mg at night.     She is widowed and lives alone, she has 1 grown daughter.  She quit smoking in 1991.  She does not drink any alcohol.  She drinks caffeine in the form of coffee, 1 or 2 cups/day.  She tries to hydrate well, she estimates that she drinks about 3 bottles of water per day.  She tries to stay active.  She likes to work in the yard.   She denies any significant memory issues, no sudden onset of one-sided weakness or numbness or tingling or droopy face or slurring of speech, no severe headaches.  She does report a strong family history of cancers including a sister with lung cancer, a sister with throat cancer.  She is 1 of a total of 9 siblings, she has 6 sisters and 2 brothers.  She has nocturia about once per average night.   Per your office note, she had a head CT without contrast on 02/18/2020 which showed no evidence of acute intracranial abnormality, mild chronic small vessel ischemic changes within the cerebral white matter.  She also had a head CT with and without contrast on 09/14/2014 which, per your office note, revealed normal findings.   Addendum, 03/23/2021, 12:23 PM:  I reviewed blood test results from 01/20/2021: CBC within was unremarkable with a WBC  of 5.5, RBC 4.43, hemoglobin 13.4, hematocrit 39%.  CMP showed blood sugar of 112, BUN 25, creatinine 0.9, AST 21, ALT 33, alk phos 61.  Lipid panel showed total cholesterol of 189, triglycerides 167, HDL of 46, LDL 110.  TSH normal at 2.138.  B12 333, vitamin D 50.  Her Past Medical History Is Significant For: Past Medical History:  Diagnosis Date   Aortic valve regurgitation    Arthritis    Asthma    Breast cancer ([place])    a. s/p chemo and radiation   Family history of breast cancer 01/10/2021   GERD (gastroesophageal reflux disease)    Heart disease    Heart failure ([facility])    Hyperlipidemia    Hypertension    Morbid obesity ([facility])    Restless leg syndrome  S/P TAVR (transcatheter aortic valve replacement) 03/04/2018   26 mm Edwards Sapien 3 transcatheter heart valve placed via percutaneous right transfemoral approach    Severe aortic stenosis     Her Past Surgical History Is Significant For: Past Surgical History:  Procedure Laterality Date   ABDOMINAL HYSTERECTOMY     BELPHAROPTOSIS REPAIR Bilateral    BREAST SURGERY     LUMPECTOMY WITH CHEMO & RADIATION   CATARACT EXTRACTION     EYE SURGERY     INTRAOPERATIVE TRANSTHORACIC ECHOCARDIOGRAM  03/04/2018   Procedure: INTRAOPERATIVE TRANSTHORACIC ECHOCARDIOGRAM;  Surgeon: Sherren Mocha, MD;  Location: [facility];  Service: Open Heart Surgery;;   JOINT REPLACEMENT     BILATERAL HIPS 2007 & 2008   RIGHT/LEFT HEART CATH AND CORONARY ANGIOGRAPHY N/A 01/31/2018   Procedure: RIGHT/LEFT HEART CATH AND CORONARY ANGIOGRAPHY;  Surgeon: Burnell Blanks, MD;  Location: [facility];  Service: Cardiovascular;  Laterality: N/A;   ROTATOR CUFF REPAIR     TONSILLECTOMY     TRANSCATHETER AORTIC VALVE REPLACEMENT, TRANSFEMORAL N/A 03/04/2018   Procedure: TRANSCATHETER AORTIC VALVE REPLACEMENT, TRANSFEMORAL. EDWARDS 26MM SAPIEN 3 TRANSCATHETER HEART VALVE.;  Surgeon: Sherren Mocha, MD;  Location: [facility];  Service: Open Heart Surgery;   Laterality: N/A;    Her Family History Is Significant For: Family History  Problem Relation Age of Onset   Heart disease Mother    Stroke Father    Lung cancer Sister 19       smoking hx   Hepatitis C Sister    Diabetes Mellitus II Sister    Breast cancer Sister 52   Diabetes Mellitus II Sister    Breast cancer Sister 29   Bladder Cancer Sister 2   Heart Problems Sister    Throat cancer Sister    Heart disease Brother    Heart Problems Brother    Heart disease Maternal Grandfather    Diabetes Mellitus II Paternal Grandfather    Lung cancer Paternal Uncle        dx 8s   Breast cancer Cousin        paternal female cousins x2, dx after 71   Breast cancer Cousin        paternal female cousins, unknown #   Lung cancer Cousin        paternal female cousin   Kidney cancer Niece        dx 10s   Cancer Niece        unknown "female" cancer; ? cervical; dx 20s   Sleep apnea Neg Hx     Her Social History Is Significant For: Social History   Socioeconomic History   Marital status: Widowed    Spouse name: Not on file   Number of children: 1   Years of education: Not on file   Highest education level: GED or equivalent  Occupational History   Not on file  Tobacco Use   Smoking status: Former    Packs/day: 1.50    Years: 20.00    Total pack years: 30.00    Types: Cigarettes    Quit date: 06/04/1990    Years since quitting: 31.8    Passive exposure: Past   Smokeless tobacco: Never  Vaping Use   Vaping Use: Never used  Substance and Sexual Activity   Alcohol use: No    Comment: none since 1985   Drug use: No   Sexual activity: Not on file  Other Topics Concern   Not on file  Social  History Narrative   Lives at home alone   Right handed   Caffeine: 1-2 cups/day of coffee   Social Determinants of Health   Financial Resource Strain: Not on file  Food Insecurity: No Food Insecurity (06/28/2021)   Hunger Vital Sign    Worried About Running Out of Food in the Last  Year: Never true    Ran Out of Food in the Last Year: Never true  Transportation Needs: No Transportation Needs (06/28/2021)   PRAPARE - Hydrologist (Medical): No    Lack of Transportation (Non-Medical): No  Physical Activity: Not on file  Stress: Not on file  Social Connections: Not on file    Her Allergies Are:  Allergies  Allergen Reactions   Entresto [Sacubitril-Valsartan] Itching   Plavix [Clopidogrel Bisulfate] Itching   Codeine Nausea And Vomiting   Oxycodone-Acetaminophen Other (See Comments)    Hallucinations  :   Her Current Medications Are:  Outpatient Encounter Medications as of 03/28/2022  Medication Sig   aspirin EC 81 MG tablet Take 1 tablet (81 mg total) by mouth daily. Swallow whole.   atorvastatin (LIPITOR) 10 MG tablet Take 10 mg by mouth at bedtime.    CALCIUM CITRATE PO Take 2,000 Units by mouth 2 (two) times daily.   Cholecalciferol (VITAMIN D3) 50 MCG (2000 UT) TABS Take 1 tablet by mouth daily.   citalopram (CELEXA) 20 MG tablet Take 20 mg by mouth daily.   clobetasol cream (TEMOVATE) 1.61 % Apply 1 application topically 2 (two) times daily as needed (vaginal irritation).   estradiol (ESTRACE) 0.1 MG/GM vaginal cream Place 0.1 Applicatorfuls vaginally 3 (three) times a week.   magnesium gluconate (MAGONATE) 500 MG tablet Take 500 mg by mouth daily.   Multiple Vitamin (MULTIVITAMIN) tablet Take 1 tablet by mouth daily.   pantoprazole (PROTONIX) 40 MG tablet Take 40 mg by mouth daily.   Probiotic Product (PROBIOTIC-10 PO) Take 1 tablet by mouth every other day.   valsartan (DIOVAN) 40 MG tablet Take 1 tablet (40 mg total) by mouth 2 (two) times daily.   furosemide (LASIX) 20 MG tablet Take 1 tablet (20 mg total) by mouth daily.   No facility-administered encounter medications on file as of 03/28/2022.  :  Review of Systems:  Out of a complete 14 point review of systems, all are reviewed and negative with the exception of these  symptoms as listed below:  Review of Systems  Neurological:        ESS. 5  Overall has been well,  in August had 3 wk of cough, so download will show this, could only tolerate for 3-4 hours.      Objective:  Neurological Exam  Physical Exam Physical Examination:   Vitals:   03/28/22 0841  BP: 121/80  Pulse: 86    General Examination: The patient is a very pleasant 76 y.o. female in no acute distress. She appears well-developed and well-nourished and well groomed.  Good spirits.  HEENT: Normocephalic, atraumatic, pupils are reactive to light, tracking is well-preserved, face is symmetric with normal facial animation and normal facial sensation.  Hearing is grossly intact.  Airway examination reveals mild mouth dryness, tongue protrudes centrally and palate elevates symmetrically.  No carotid bruits, no lip, neck or jaw tremor, neck is supple. Corrective eyeglasses in place.      Chest: Clear to auscultation without wheezing, rhonchi or crackles noted.   Heart: S1+S2+0, regular and normal without murmurs, rubs or gallops noted.  Abdomen: Soft, non-tender and non-distended.   Extremities: There is no pitting edema.     Skin: Warm and dry without trophic changes noted.    Musculoskeletal: exam reveals prominent arthritic changes in both hands, stable.     Neurologically:  Mental status: The patient is awake, alert and oriented in all 4 spheres. Her immediate and remote memory, attention, language skills and fund of knowledge are appropriate. There is no evidence of aphasia, agnosia, apraxia or anomia. Speech is clear with normal prosody and enunciation. Thought process is linear. Mood is normal and affect is normal.  Cranial nerves II - XII are as described above under HEENT exam.  Motor exam: Normal bulk, strength and tone is noted. There is no obvious tremor.  Fine motor skills and coordination: Grossly intact.   Cerebellar testing: No dysmetria or intention tremor.  Normal  finger-to-nose and normal heel-to-shin bilaterally.  There is no truncal or gait ataxia.  Sensory exam: intact to light touch, vibration and temperature in the upper and lower extremities.  Gait, station and balance: She stands without difficulty, she walks without a walking aid, no limp, no shuffling, has preserved arm swing bilaterally.    Assessment and Plan:    In summary, Judith Solis is a very pleasant 76 year old female with an underlying medical history of hyperlipidemia, breast cancer, reflux disease, aortic stenosis with status post TAVR, hypertension, obstructive sleep apnea, incidental finding of cerebellar lacunar stroke on recent brain MRI, obesity, arthritis, status post bilateral hip replacements, status post lumbar laminectomy, who presents for follow-up consultation of her sleep apnea.  We had proceeded with a brain MRI in 2022.  She has some white matter changes and a chronic lacunar stroke was found.  She is on secondary preventative medications, we talked about the importance of maintaining a healthy lifestyle.  She is advised to talk to her primary care about increasing potentially her cholesterol medication as her LDL was close to 100 and we would like to see it below 70.  She is advised to continue with her current medications otherwise.  She has successfully come off of trazodone, pramipexole and gabapentin.  She feels that her mental sluggishness is better, she still has word finding difficulty and reports name recall issues but other than that she is doing well.  She is compliant with her AutoPap and apnea is under great control.  She is commended for her treatment adherence.  She is advised to follow-up routinely to see one of our nurse practitioners in 1 year.  She is up-to-date with her supplies through her DME company, [employer]. I answered all her questions today and she was in agreement with the plan.  I spent 40 minutes in total face-to-face time and in reviewing records  during pre-charting, more than 50% of which was spent in counseling and coordination of care, reviewing test results, reviewing medications and treatment regimen and/or in discussing or reviewing the diagnosis of OSA, lacunar stroke, the prognosis and treatment options. Pertinent laboratory and imaging test results that were available during this visit with the patient were reviewed by me and considered in my medical decision making (see chart for details).

## 2022-03-28 NOTE — Patient Instructions (Addendum)
It was nice to see you again today.  You are compliant with your AutoPap machine.  Your numbers look good including your apnea control and leak from the mask is very low.  Your average usage is nearly 7 hours, keep up the good work!    Please continue using your CPAP regularly. While your insurance requires that you use CPAP at least 4 hours each night on 70% of the nights, I recommend, that you not skip any nights and use it throughout the night if you can. Getting used to CPAP and staying with the treatment long term does take time and patience and discipline. Untreated obstructive sleep apnea when it is moderate to severe can have an adverse impact on cardiovascular health and raise her risk for heart disease, arrhythmias, hypertension, congestive heart failure, stroke and diabetes. Untreated obstructive sleep apnea causes sleep disruption, nonrestorative sleep, and sleep deprivation. This can have an impact on your day to day functioning and cause daytime sleepiness and impairment of cognitive function, memory loss, mood disturbance, and problems focussing. Using CPAP regularly can improve these symptoms.  We can see you in 1 year, you can see one of our nurse practitioners as you are stable.  Please check with your primary care about increasing the dose of your cholesterol medication. Ideally, we want your LDL (ie "bad" cholesterol) to be less than 70. Your level was 96 in February of this year.

## 2022-03-29 DIAGNOSIS — L578 Other skin changes due to chronic exposure to nonionizing radiation: Secondary | ICD-10-CM | POA: Diagnosis not present

## 2022-03-29 DIAGNOSIS — L820 Inflamed seborrheic keratosis: Secondary | ICD-10-CM | POA: Diagnosis not present

## 2022-03-29 DIAGNOSIS — L814 Other melanin hyperpigmentation: Secondary | ICD-10-CM | POA: Diagnosis not present

## 2022-04-03 ENCOUNTER — Ambulatory Visit: Payer: Medicare Other | Admitting: *Deleted

## 2022-04-26 DIAGNOSIS — Z23 Encounter for immunization: Secondary | ICD-10-CM | POA: Diagnosis not present

## 2022-05-22 DIAGNOSIS — I10 Essential (primary) hypertension: Secondary | ICD-10-CM | POA: Diagnosis not present

## 2022-05-22 DIAGNOSIS — E782 Mixed hyperlipidemia: Secondary | ICD-10-CM | POA: Diagnosis not present

## 2022-06-06 DIAGNOSIS — M25511 Pain in right shoulder: Secondary | ICD-10-CM | POA: Diagnosis not present

## 2022-06-28 ENCOUNTER — Encounter: Payer: Self-pay | Admitting: Cardiology

## 2022-06-28 ENCOUNTER — Ambulatory Visit: Payer: Medicare Other | Attending: Cardiology | Admitting: Cardiology

## 2022-06-28 VITALS — BP 114/76 | HR 98 | Ht 65.0 in | Wt 211.0 lb

## 2022-06-28 DIAGNOSIS — Z952 Presence of prosthetic heart valve: Secondary | ICD-10-CM | POA: Insufficient documentation

## 2022-06-28 DIAGNOSIS — I110 Hypertensive heart disease with heart failure: Secondary | ICD-10-CM | POA: Insufficient documentation

## 2022-06-28 DIAGNOSIS — I5042 Chronic combined systolic (congestive) and diastolic (congestive) heart failure: Secondary | ICD-10-CM | POA: Insufficient documentation

## 2022-06-28 DIAGNOSIS — I447 Left bundle-branch block, unspecified: Secondary | ICD-10-CM | POA: Insufficient documentation

## 2022-06-28 LAB — COMPREHENSIVE METABOLIC PANEL
ALT: 58 IU/L — ABNORMAL HIGH (ref 0–32)
AST: 37 IU/L (ref 0–40)
Albumin/Globulin Ratio: 1.8 (ref 1.2–2.2)
Albumin: 4.4 g/dL (ref 3.8–4.8)
Alkaline Phosphatase: 80 IU/L (ref 44–121)
BUN/Creatinine Ratio: 20 (ref 12–28)
BUN: 19 mg/dL (ref 8–27)
Bilirubin Total: 0.5 mg/dL (ref 0.0–1.2)
CO2: 24 mmol/L (ref 20–29)
Calcium: 9.7 mg/dL (ref 8.7–10.3)
Chloride: 101 mmol/L (ref 96–106)
Creatinine, Ser: 0.96 mg/dL (ref 0.57–1.00)
Globulin, Total: 2.4 g/dL (ref 1.5–4.5)
Glucose: 113 mg/dL — ABNORMAL HIGH (ref 70–99)
Potassium: 4.9 mmol/L (ref 3.5–5.2)
Sodium: 138 mmol/L (ref 134–144)
Total Protein: 6.8 g/dL (ref 6.0–8.5)
eGFR: 61 mL/min/{1.73_m2} (ref >59)

## 2022-06-28 LAB — LIPID PANEL
Chol/HDL Ratio: 3.7 ratio (ref 0.0–4.4)
Cholesterol, Total: 161 mg/dL (ref 100–199)
HDL: 43 mg/dL (ref >39)
LDL Chol Calc (NIH): 90 mg/dL (ref 0–99)
Triglycerides: 159 mg/dL — ABNORMAL HIGH (ref 0–149)
VLDL Cholesterol Cal: 28 mg/dL (ref 5–40)

## 2022-06-28 NOTE — Progress Notes (Signed)
Cardiology Office Note:    Date:  06/28/2022   ID:  Judith Solis, [place] January 27, 1946, MRN [id number]  PCP:  Ronita Hipps, MD  Cardiologist:  Shirlee More, MD    Referring MD: Ronita Hipps, MD    ASSESSMENT:    1. Hypertensive heart disease with chronic combined systolic and diastolic congestive heart failure ([facility])   2. Left bundle branch block   3. S/P TAVR (transcatheter aortic valve replacement)    PLAN:    In order of problems listed above:  She is finally improved New York Heart Association class I on good medical therapy with heart failure mildly reduced ejection fraction and will continue aspirin statin ARB and her loop diuretic recheck renal function today Stable following TAVR no findings of valve dysfunctions   Next appointment: 6 months   Medication Adjustments/Labs and Tests Ordered: Current medicines are reviewed at length with the patient today.  Concerns regarding medicines are outlined above.  Orders Placed This Encounter  Procedures   Comp Met (CMET)   Lipid Profile   No orders of the defined types were placed in this encounter.   Chief Complaint  Patient presents with   Follow-up   Congestive Heart Failure    After TAVR     History of Present Illness:    Judith Solis is a 76 y.o. female with a hx of severe symptomatic aortic stenosis with TAVR hypertensive heart disease with heart failure left bundle branch block hyperlipidemia calcific mitral valve disease with mild to moderate mitral regurgitation  seen 12/08/2021. She remained short of breath and was transitioned from ARB to [facility].  She was last seen 02/26/2022 with heart failure improved compensated no evidence of TAVR dysfunction no findings to suggest underlying cardiac amyloidosis.  She had ongoing respiratory symptoms related to COVID-19 infection.   Echocardiogram 09/11/2021 showed similar mild LV dysfunction EF 45 to 50% with mild concentric LVH and grade 1 diastolic dysfunction right  ventricle is normal size function and pulmonary artery pressure normal TAVR function  was normaland there is mild mitral regurgitation.    Compliance with diet, lifestyle and medications: Yes  She is markedly improved weight stable at home no shortness of breath orthopnea chest pain palpitation or syncope no edema blood pressure consistently 115 125 60-70  Past Medical History:  Diagnosis Date   Aortic valve regurgitation    Arthritis    Asthma    Breast cancer ([facility])    a. 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   26 mm Edwards Sapien 3 transcatheter heart valve placed via percutaneous right transfemoral approach    Severe aortic stenosis     Past Surgical History:  Procedure Laterality Date   ABDOMINAL HYSTERECTOMY     BELPHAROPTOSIS REPAIR Bilateral    BREAST SURGERY     LUMPECTOMY WITH CHEMO & RADIATION   CATARACT EXTRACTION     EYE SURGERY     INTRAOPERATIVE TRANSTHORACIC ECHOCARDIOGRAM  03/04/2018   Procedure: INTRAOPERATIVE TRANSTHORACIC ECHOCARDIOGRAM;  Surgeon: Sherren Mocha, MD;  Location: [facility];  Service: Open Heart Surgery;;   JOINT REPLACEMENT     BILATERAL HIPS 2007 & 2008   RIGHT/LEFT HEART CATH AND CORONARY ANGIOGRAPHY  N/A 01/31/2018   Procedure: RIGHT/LEFT HEART CATH AND CORONARY ANGIOGRAPHY;  Surgeon: Burnell Blanks, MD;  Location: [facility];  Service: Cardiovascular;  Laterality: N/A;   ROTATOR CUFF REPAIR     TONSILLECTOMY     TRANSCATHETER AORTIC VALVE REPLACEMENT, TRANSFEMORAL N/A 03/04/2018   Procedure: TRANSCATHETER AORTIC VALVE REPLACEMENT, TRANSFEMORAL. EDWARDS 26MM SAPIEN 3 TRANSCATHETER HEART VALVE.;  Surgeon: Sherren Mocha, MD;  Location: [facility];  Service: Open Heart Surgery;   Laterality: N/A;    Current Medications: Current Meds  Medication Sig   aspirin EC 81 MG tablet Take 1 tablet (81 mg total) by mouth daily. Swallow whole.   atorvastatin (LIPITOR) 20 MG tablet Take 20 mg by mouth at bedtime.   CALCIUM CITRATE PO Take 2,000 Units by mouth 2 (two) times daily.   Cholecalciferol (VITAMIN D3) 50 MCG (2000 UT) TABS Take 1 tablet by mouth daily.   citalopram (CELEXA) 20 MG tablet Take 20 mg by mouth daily.   clobetasol cream (TEMOVATE) 2.29 % Apply 1 application topically 2 (two) times daily as needed (vaginal irritation).   estradiol (ESTRACE) 0.1 MG/GM vaginal cream Place 0.1 Applicatorfuls vaginally 3 (three) times a week.   furosemide (LASIX) 20 MG tablet Take 1 tablet (20 mg total) by mouth daily.   magnesium gluconate (MAGONATE) 500 MG tablet Take 500 mg by mouth daily.   Multiple Vitamin (MULTIVITAMIN) tablet Take 1 tablet by mouth daily.   pantoprazole (PROTONIX) 40 MG tablet Take 40 mg by mouth daily.   Probiotic Product (PROBIOTIC-10 PO) Take 1 tablet by mouth every other day.   valACYclovir (VALTREX) 1000 MG tablet Take 1,000 mg by mouth daily.   valsartan (DIOVAN) 40 MG tablet Take 1 tablet (40 mg total) by mouth 2 (two) times daily.     Allergies:   Entresto [sacubitril-valsartan], Plavix [clopidogrel bisulfate], Codeine, and Oxycodone-acetaminophen   Social History   Socioeconomic History   Marital status: Widowed    Spouse name: Not on file   Number of children: 1   Years of education: Not on file   Highest education level: GED or equivalent  Occupational History   Not on file  Tobacco Use   Smoking status: Former    Packs/day: 1.50    Years: 20.00    Total pack years: 30.00    Types: Cigarettes    Quit date: 06/04/1990    Years since quitting: 32.0    Passive exposure: Past   Smokeless tobacco: Never  Vaping Use   Vaping Use: Never used  Substance and Sexual Activity   Alcohol use: No    Comment: none since 1985   Drug use:  No   Sexual activity: Not on file  Other Topics Concern   Not on file  Social History Narrative   Lives at home alone   Right handed   Caffeine: 1-2 cups/day of coffee   Social Determinants of Health   Financial Resource Strain: Not on file  Food Insecurity: No Food Insecurity (06/28/2021)   Hunger Vital Sign    Worried About Running Out of Food in the Last Year: Never true    Ran Out of Food in the Last Year: Never true  Transportation Needs: No Transportation Needs (06/28/2021)   PRAPARE - Hydrologist (Medical): No    Lack of Transportation (Non-Medical): No  Physical Activity: Not on file  Stress: Not on file  Social Connections: Not on file     Family History: The  patient's family history includes Bladder Cancer (age of onset: 87) in her sister; Breast cancer in her cousin and cousin; Breast cancer (age of onset: 26) in her sister; Breast cancer (age of onset: 71) in her sister; Cancer in her niece; Diabetes Mellitus II in her paternal grandfather, sister, and sister; Heart Problems in her brother and sister; Heart disease in her brother, maternal grandfather, and mother; Hepatitis C in her sister; Kidney cancer in her niece; Lung cancer in her cousin and paternal uncle; Lung cancer (age of onset: 68) in her sister; Stroke in her father; Throat cancer in her sister. There is no history of Sleep apnea. ROS:   Please see the history of present illness.    All other systems reviewed and are negative.  EKGs/Labs/Other Studies Reviewed:    The following studies were reviewed today:   Recent Labs: 09/08/2021: TSH 2.450 09/25/2021: ALT 39; Hemoglobin 14.0; Platelets 250 02/26/2022: BUN 22; Creatinine, Ser 0.91; NT-Pro BNP 206; Potassium 4.1; Sodium 136  Recent Lipid Panel    Component Value Date/Time   CHOL 176 09/08/2021 1348   TRIG 275 (H) 09/08/2021 1348   HDL 34 (L) 09/08/2021 1348   CHOLHDL 5.2 (H) 09/08/2021 1348   LDLCALC 96 09/08/2021 1348     Physical Exam:    VS:  BP 114/76 (BP Location: Left Arm, Patient Position: Sitting)   Pulse 98   Ht _0  (1.651 m)   Wt 211 lb (95.7 kg)   SpO2 100%   BMI 35.11 kg/m     Wt Readings from Last 3 Encounters:  06/28/22 211 lb (95.7 kg)  03/28/22 212 lb 12.8 oz (96.5 kg)  02/26/22 209 lb (94.8 kg)     GEN:  Well nourished, well developed in no acute distress HEENT: Normal NECK: No JVD; No carotid bruits LYMPHATICS: No lymphadenopathy CARDIAC: RRR, no murmurs, rubs, gallops RESPIRATORY:  Clear to auscultation without rales, wheezing or rhonchi  ABDOMEN: Soft, non-tender, non-distended MUSCULOSKELETAL:  No edema; No deformity  SKIN: Warm and dry NEUROLOGIC:  Alert and oriented x 3 PSYCHIATRIC:  Normal affect    Signed, Shirlee More, MD  06/28/2022 9:58 AM    [place]

## 2022-06-28 NOTE — Patient Instructions (Signed)
Medication Instructions:  Your physician recommends that you continue on your current medications as directed. Please refer to the Current Medication list given to you today.  *If you need a refill on your cardiac medications before your next appointment, please call your pharmacy*   Lab Work: Your physician recommends that you return for lab work in:   Labs today: CMP, Lipids  If you have labs (blood work) drawn today and your tests are completely normal, you will receive your results only by: MyChart Message (if you have MyChart) OR A paper copy in the mail If you have any lab test that is abnormal or we need to change your treatment, we will call you to review the results.   Testing/Procedures: None   Follow-Up: At [facility], you and your health needs are our priority.  As part of our continuing mission to provide you with exceptional heart care, we have created designated Provider Care Teams.  These Care Teams include your primary Cardiologist (physician) and Advanced Practice Providers (APPs -  Physician Assistants and Nurse Practitioners) who all work together to provide you with the care you need, when you need it.  We recommend signing up for the patient portal called "MyChart".  Sign up information is provided on this After Visit Summary.  MyChart is used to connect with patients for Virtual Visits (Telemedicine).  Patients are able to view lab/test results, encounter notes, upcoming appointments, etc.  Non-urgent messages can be sent to your provider as well.   To learn more about what you can do with MyChart, go to [email or web address].    Your next appointment:   6 month(s)  The format for your next appointment:   In Person  Provider:   Brian Munley, MD    Other Instructions None  Important Information About Sugar       

## 2022-06-29 ENCOUNTER — Telehealth: Payer: Self-pay

## 2022-06-29 NOTE — Telephone Encounter (Signed)
-----   Message from Richardo Priest, MD sent at 06/29/2022  7:43 AM EST ----- Normal or stable result  Good result no changes

## 2022-06-29 NOTE — Telephone Encounter (Signed)
Patient notified of results.

## 2022-08-20 DIAGNOSIS — H18513 Endothelial corneal dystrophy, bilateral: Secondary | ICD-10-CM | POA: Diagnosis not present

## 2022-08-20 DIAGNOSIS — M79674 Pain in right toe(s): Secondary | ICD-10-CM | POA: Diagnosis not present

## 2022-08-24 DIAGNOSIS — S5002XA Contusion of left elbow, initial encounter: Secondary | ICD-10-CM | POA: Diagnosis not present

## 2022-09-12 DIAGNOSIS — Z1231 Encounter for screening mammogram for malignant neoplasm of breast: Secondary | ICD-10-CM | POA: Diagnosis not present

## 2022-09-12 DIAGNOSIS — M12811 Other specific arthropathies, not elsewhere classified, right shoulder: Secondary | ICD-10-CM | POA: Diagnosis not present

## 2022-09-12 DIAGNOSIS — M75101 Unspecified rotator cuff tear or rupture of right shoulder, not specified as traumatic: Secondary | ICD-10-CM | POA: Diagnosis not present

## 2022-09-24 NOTE — Progress Notes (Incomplete)
[facility]  [address] [place],  [place]  [address] [phone]  Clinic Day:  09/24/2022  Referring physician: Ronita Hipps, MD  This document serves as a record of services personally performed by Judith Poisson, MD. It was created on their behalf by Judith Solis, a trained medical scribe. The creation of this record is based on the scribe's personal observations and the provider's statements to them.  CHIEF COMPLAINT:  CC: History of stage IA hormone receptor positive left breast cancer  Current Treatment:  Surveillance  HISTORY OF PRESENT ILLNESS:  Judith Solis is a 77 y.o. female who we began seeing in August 2014 for follow-up of breast cancer, when she moved to the area from [place].  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  She was treated with lumpectomy.  Pathology revealed a 1.5 cm, grade 3, invasive ductal carcinoma with a negative nodes.  Estrogen and progesterone receptors were positive and her 2 Neu negative.  Oncotype DX score was 25, which is in an intermediate risk category.  She received adjuvant chemotherapy with Taxotere and Cytoxan for 3 months, followed by adjuvant radiation to the left breast, as well as Arimidex for 5 years completed in March 2015.  She is here for yearly routine follow-up and states she has been doing fairly well.  She denies any changes in her breasts.  Prior to her visit today she did undergo bilateral diagnostic mammogram.  She has had problems with severe bladder/urethral/vaginal pressure with urinary frequency.  She was seen by Dr. Matilde Solis and underwent cystoscopy, which was negative.  He felt her symptoms were most likely due to vaginal dryness.  Her symptoms did not improve with Vesicare.  In 2016, she saw Dr. Jimmie Solis for cardiac evaluation and was found to have congestive heart failure, as well as aortic stenosis.  She was  placed on losartan/hydrochlorothiazide 50/12.5 daily.  She had an echocardiogram at that time.  She had shoulder surgery in 22-Jun-2015.  She had osteopenia on bone density in March 2015.  Repeat bone density scan in 2017 revealed persistent osteopenia.  There was a 6% worsening of her spine, but a 3% improvement of her left forearm.  Her husband passed away in 06/21/2017 after a prolonged illness.  She had her aortic valve replaced at [facility] in 2019 with a bovine valve, so she does not require full anticoagulation.  She states that she has had two sisters who had breast cancer, and had to undergo bilateral mastectomies.  She also had a sister who died of lung cancer at age 34.  One of the sisters that had breast cancer had advanced bladder cancer and required total cystectomy.  So far genetic testing has been negative on family members.  INTERVAL HISTORY:  Judith Solis is here for annual follow up and states that she has been doing well other than arthralgias of the bilateral hands. Annual mammogram from February was clear. She continues oral calcium and vitamin D daily. She states that one of her sisters had to undergo a bilateral mastectomy and she also had her bladder removed due to malignancy. She states that all of them have had genetic testing, but they were all negative. Blood counts are unremarkable. Her  appetite is good, and she has lost 4 and 1/2 pounds since her last visit.  She denies fever, chills or other signs of infection.  She denies  nausea, vomiting, bowel issues, or abdominal pain.  She denies sore throat, cough, dyspnea, or chest pain.  REVIEW OF SYSTEMS:  Review of Systems  Constitutional: Negative.  Negative for appetite change, chills, diaphoresis, fatigue, fever and unexpected weight change.  HENT:  Negative.  Negative for hearing loss, lump/mass, mouth sores, nosebleeds, sore throat, tinnitus, trouble swallowing and voice change.   Eyes: Negative.  Negative for eye problems and  icterus.  Respiratory: Negative.  Negative for chest tightness, cough, hemoptysis, shortness of breath and wheezing.   Cardiovascular: Negative.  Negative for chest pain, leg swelling and palpitations.  Gastrointestinal: Negative.  Negative for abdominal distention, abdominal pain, blood in stool, constipation, diarrhea, nausea, rectal pain and vomiting.  Endocrine: Negative.   Genitourinary: Negative.  Negative for bladder incontinence, difficulty urinating, dyspareunia, dysuria, frequency, hematuria, menstrual problem, nocturia, pelvic pain, vaginal bleeding and vaginal discharge.   Musculoskeletal:  Positive for arthralgias (mainly of the bilateral hands). Negative for back pain, flank pain, gait problem, myalgias, neck pain and neck stiffness.  Skin: Negative.  Negative for itching, rash and wound.  Neurological: Negative.  Negative for dizziness, extremity weakness, gait problem, headaches, light-headedness, numbness, seizures and speech difficulty.  Hematological: Negative.  Negative for adenopathy. Does not bruise/bleed easily.  Psychiatric/Behavioral: Negative.  Negative for confusion, decreased concentration, depression, sleep disturbance and suicidal ideas. The patient is not nervous/anxious.      VITALS:  There were no vitals taken for this visit.  Wt Readings from Last 3 Encounters:  06/28/22 211 lb (95.7 kg)  03/28/22 212 lb 12.8 oz (96.5 kg)  02/26/22 209 lb (94.8 kg)    There is no height or weight on file to calculate BMI.  Performance status (ECOG): 1 - Symptomatic but completely ambulatory  PHYSICAL EXAM:  Physical Exam Vitals and nursing note reviewed.  Constitutional:      General: She is not in acute distress.    Appearance: Normal appearance. She is normal weight. She is not ill-appearing, toxic-appearing or diaphoretic.  HENT:     Head: Normocephalic and atraumatic.     Right Ear: Tympanic membrane, ear canal and external ear normal. There is no impacted  cerumen.     Left Ear: Tympanic membrane, ear canal and external ear normal. There is no impacted cerumen.     Nose: Nose normal. No congestion or rhinorrhea.     Mouth/Throat:     Mouth: Mucous membranes are moist.     Pharynx: Oropharynx is clear. No oropharyngeal exudate or posterior oropharyngeal erythema.  Eyes:     General: No scleral icterus.       Right eye: No discharge.        Left eye: No discharge.     Extraocular Movements: Extraocular movements intact.     Conjunctiva/sclera: Conjunctivae normal.     Pupils: Pupils are equal, round, and reactive to light.  Neck:     Vascular: No carotid bruit.  Cardiovascular:     Rate and Rhythm: Normal rate and regular rhythm.     Pulses: Normal pulses.     Heart sounds: Normal heart sounds. No murmur heard.    No friction rub. No gallop.  Pulmonary:     Effort: Pulmonary effort is normal. No respiratory distress.     Breath sounds: Normal breath sounds. No stridor. No wheezing, rhonchi or rales.  Chest:     Chest wall: No tenderness.  Breasts:    Right: Normal.     Left: Normal.  Comments: Well healed scar in the lower inner quadrant of the left breast with a small nodule above the center which is stable. No masses in either breast. Faint scar of the left axilla. Abdominal:     General: Bowel sounds are normal. There is no distension.     Palpations: Abdomen is soft. There is no hepatomegaly, splenomegaly or mass.     Tenderness: There is no abdominal tenderness. There is no right CVA tenderness, left CVA tenderness, guarding or rebound.     Hernia: No hernia is present.  Musculoskeletal:        General: No swelling, tenderness, deformity or signs of injury. Normal range of motion.     Cervical back: Normal range of motion and neck supple. No rigidity or tenderness.     Right lower leg: No edema.     Left lower leg: No edema.     Comments: Swelling of her PIPs and DIPs of the bilateral hands   Lymphadenopathy:      Cervical: No cervical adenopathy.  Skin:    General: Skin is warm and dry.     Coloration: Skin is not jaundiced or pale.     Findings: No bruising, erythema, lesion or rash.     Comments: Prominent scar in the upper anterior right chest.  Neurological:     General: No focal deficit present.     Mental Status: She is alert and oriented to person, place, and time. Mental status is at baseline.     Cranial Nerves: No cranial nerve deficit.     Sensory: No sensory deficit.     Motor: No weakness.     Coordination: Coordination normal.     Gait: Gait normal.     Deep Tendon Reflexes: Reflexes normal.  Psychiatric:        Mood and Affect: Mood normal.        Behavior: Behavior normal.        Thought Content: Thought content normal.        Judgment: Judgment normal.     LABS:      Latest Ref Rng & Units 09/25/2021   12:00 AM 09/08/2021    1:48 PM 03/13/2019    1:48 PM  CBC  WBC  5.9  6.1  6.0   Hemoglobin 12.0 - 16.0 14.0  13.3  12.9   Hematocrit 36 - 46 42  38.7  37.3   Platelets 150 - 399 250  244  215       Latest Ref Rng & Units 06/28/2022    9:33 AM 02/26/2022   10:27 AM 12/08/2021   10:10 AM  CMP  Glucose 70 - 99 mg/dL 113  86  113   BUN 8 - 27 mg/dL '19  22  21   '$ Creatinine 0.57 - 1.00 mg/dL 0.96  0.91  0.91   Sodium 134 - 144 mmol/L 138  136  136   Potassium 3.5 - 5.2 mmol/L 4.9  4.1  4.5   Chloride 96 - 106 mmol/L 101  100  98   CO2 20 - 29 mmol/L '24  25  23   '$ Calcium 8.7 - 10.3 mg/dL 9.7  9.9  9.6   Total Protein 6.0 - 8.5 g/dL 6.8     Total Bilirubin 0.0 - 1.2 mg/dL 0.5     Alkaline Phos 44 - 121 IU/L 80     AST 0 - 40 IU/L 37     ALT 0 - 32 IU/L 58  Component Ref Range & Units 06/28/2022 1 yr ago 2 yr ago 3 yr ago  Cholesterol, Total 100 - 199 mg/dL 161 176 165 165  Triglycerides 0 - 149 mg/dL 159 High  275 High  176 High  221 High   HDL >39 mg/dL 43 34 Low  35 Low  32 Low   VLDL Cholesterol Cal 5 - 40 mg/dL 28 46 High  31 38  LDL Chol Calc (NIH) 0 - 99  mg/dL 90 96 99 95  Chol/HDL Ratio 0.0 - 4.4 ratio 3.7 5.2 High  CM 4.7 High  CM 5.2 High       Component Ref Range & Units 7 mo ago (02/26/22) 9 mo ago (12/08/21) 10 mo ago (11/03/21)  NT-Pro BNP 0 - 738 pg/mL 206 158 CM 81 CM   STUDIES:  EXAM: 09/11/2021 DIGITAL SCREENING BILATERAL MAMMOGRAM WITH TOMOSYNTHESIS AND CAD   TECHNIQUE:  Bilateral screening digital craniocaudal and mediolateral oblique  mammograms were obtained. Bilateral screening digital breast  tomosynthesis was performed. The images were evaluated with  computer-aided detection.   COMPARISON: Previous exam(s).   ACR Breast Density Category b: There are scattered areas of  fibroglandular density.   FINDINGS:  There are no findings suspicious for malignancy. Expected post  lumpectomy changes in the LEFT breast.   IMPRESSION:  No mammographic evidence of malignancy.   Allergies:  Allergies  Allergen Reactions   Entresto [Sacubitril-Valsartan] Itching   Plavix [Clopidogrel Bisulfate] Itching   Codeine Nausea And Vomiting   Oxycodone-Acetaminophen Other (See Comments)    Hallucinations    Current Medications: Current Outpatient Medications  Medication Sig Dispense Refill   aspirin EC 81 MG tablet Take 1 tablet (81 mg total) by mouth daily. Swallow whole. 90 tablet 3   atorvastatin (LIPITOR) 20 MG tablet Take 20 mg by mouth at bedtime.     CALCIUM CITRATE PO Take 2,000 Units by mouth 2 (two) times daily.     Cholecalciferol (VITAMIN D3) 50 MCG (2000 UT) TABS Take 1 tablet by mouth daily.     citalopram (CELEXA) 20 MG tablet Take 20 mg by mouth daily.     clobetasol cream (TEMOVATE) [id number] % Apply 1 application topically 2 (two) times daily as needed (vaginal irritation).     estradiol (ESTRACE) 0.1 MG/GM vaginal cream Place 0.1 Applicatorfuls vaginally 3 (three) times a week.     furosemide (LASIX) 20 MG tablet Take 1 tablet (20 mg total) by mouth daily. 90 tablet 3   magnesium gluconate (MAGONATE) 500 MG  tablet Take 500 mg by mouth daily.     Multiple Vitamin (MULTIVITAMIN) tablet Take 1 tablet by mouth daily.     pantoprazole (PROTONIX) 40 MG tablet Take 40 mg by mouth daily.     Probiotic Product (PROBIOTIC-10 PO) Take 1 tablet by mouth every other day.     valACYclovir (VALTREX) 1000 MG tablet Take 1,000 mg by mouth daily.     valsartan (DIOVAN) 40 MG tablet Take 1 tablet (40 mg total) by mouth 2 (two) times daily. 180 tablet 3   No current facility-administered medications for this visit.     ASSESSMENT & PLAN:   Assessment:   1.  Remote history of stage IA hormone receptor positive breast cancer, diagnosed in July 2009.  She remains without evidence of recurrence.    2.  Osteopenia for which she is taking oral calcium and vitamin D. Bone densities are scheduled through Dr. Helene Kelp.    Plan: As she continues to  do well, we will plan to see her back in 1 year with bilateral screening mammogram.  The patient understands the plans discussed today and is in agreement with them.  She knows to contact our office if she develops concerns regarding her breast cancer.  I provided 15 minutes of face-to-face time during this this encounter and > 50% was spent counseling as documented under my assessment and plan.    Derwood Kaplan, MD [facility] [address] [place] [address] Dept: [phone] Dept Fax: [phone]    Sumner Boast Lassiter,acting as a scribe for Derwood Kaplan, MD.,have documented all relevant documentation on the behalf of Derwood Kaplan, MD,as directed by  Derwood Kaplan, MD while in the presence of Derwood Kaplan, MD.   Benjaman Pott Lassiter

## 2022-09-25 ENCOUNTER — Encounter: Payer: Self-pay | Admitting: Oncology

## 2022-09-26 ENCOUNTER — Other Ambulatory Visit: Payer: Medicare Other

## 2022-09-26 ENCOUNTER — Ambulatory Visit: Payer: Medicare Other | Admitting: Oncology

## 2022-09-27 DIAGNOSIS — L814 Other melanin hyperpigmentation: Secondary | ICD-10-CM | POA: Diagnosis not present

## 2022-09-27 DIAGNOSIS — L820 Inflamed seborrheic keratosis: Secondary | ICD-10-CM | POA: Diagnosis not present

## 2022-09-27 DIAGNOSIS — L578 Other skin changes due to chronic exposure to nonionizing radiation: Secondary | ICD-10-CM | POA: Diagnosis not present

## 2022-10-11 ENCOUNTER — Inpatient Hospital Stay: Payer: Medicare Other | Attending: Oncology

## 2022-10-11 ENCOUNTER — Other Ambulatory Visit: Payer: Self-pay | Admitting: Oncology

## 2022-10-11 ENCOUNTER — Encounter: Payer: Self-pay | Admitting: Oncology

## 2022-10-11 ENCOUNTER — Inpatient Hospital Stay (INDEPENDENT_AMBULATORY_CARE_PROVIDER_SITE_OTHER): Payer: Medicare Other | Admitting: Oncology

## 2022-10-11 ENCOUNTER — Telehealth: Payer: Self-pay | Admitting: Oncology

## 2022-10-11 VITALS — BP 122/77 | HR 77 | Temp 98.7°F | Resp 14 | Ht 65.0 in | Wt 211.7 lb

## 2022-10-11 DIAGNOSIS — Z1231 Encounter for screening mammogram for malignant neoplasm of breast: Secondary | ICD-10-CM

## 2022-10-11 DIAGNOSIS — Z170 Estrogen receptor positive status [ER+]: Secondary | ICD-10-CM

## 2022-10-11 DIAGNOSIS — Z780 Asymptomatic menopausal state: Secondary | ICD-10-CM | POA: Diagnosis not present

## 2022-10-11 DIAGNOSIS — M8580 Other specified disorders of bone density and structure, unspecified site: Secondary | ICD-10-CM

## 2022-10-11 DIAGNOSIS — Z853 Personal history of malignant neoplasm of breast: Secondary | ICD-10-CM | POA: Insufficient documentation

## 2022-10-11 DIAGNOSIS — C50312 Malignant neoplasm of lower-inner quadrant of left female breast: Secondary | ICD-10-CM | POA: Diagnosis not present

## 2022-10-11 LAB — CBC WITH DIFFERENTIAL (CANCER CENTER ONLY)
Abs Immature Granulocytes: 0.02 10*3/uL (ref 0.00–0.07)
Basophils Absolute: 0.1 10*3/uL (ref 0.0–0.1)
Basophils Relative: 1 %
Eosinophils Absolute: 0.2 10*3/uL (ref 0.0–0.5)
Eosinophils Relative: 3 %
HCT: 41.6 % (ref 36.0–46.0)
Hemoglobin: 13.7 g/dL (ref 12.0–15.0)
Immature Granulocytes: 0 %
Lymphocytes Relative: 38 %
Lymphs Abs: 2.9 10*3/uL (ref 0.7–4.0)
MCH: 31.8 pg (ref 26.0–34.0)
MCHC: 32.9 g/dL (ref 30.0–36.0)
MCV: 96.5 fL (ref 80.0–100.0)
Monocytes Absolute: 0.7 10*3/uL (ref 0.1–1.0)
Monocytes Relative: 10 %
Neutro Abs: 3.7 10*3/uL (ref 1.7–7.7)
Neutrophils Relative %: 48 %
Platelet Count: 263 10*3/uL (ref 150–400)
RBC: 4.31 MIL/uL (ref 3.87–5.11)
RDW: 11.9 % (ref 11.5–15.5)
WBC Count: 7.6 10*3/uL (ref 4.0–10.5)
nRBC: 0 % (ref 0.0–0.2)

## 2022-10-11 LAB — CMP (CANCER CENTER ONLY)
ALT: 61 U/L — ABNORMAL HIGH (ref 0–44)
AST: 38 U/L (ref 15–41)
Albumin: 4.3 g/dL (ref 3.5–5.0)
Alkaline Phosphatase: 72 U/L (ref 38–126)
Anion gap: 7 (ref 5–15)
BUN: 20 mg/dL (ref 8–23)
CO2: 28 mmol/L (ref 22–32)
Calcium: 9.1 mg/dL (ref 8.9–10.3)
Chloride: 99 mmol/L (ref 98–111)
Creatinine: 0.99 mg/dL (ref 0.44–1.00)
GFR, Estimated: 58 mL/min — ABNORMAL LOW (ref >60)
Glucose, Bld: 129 mg/dL — ABNORMAL HIGH (ref 70–99)
Potassium: 3.9 mmol/L (ref 3.5–5.1)
Sodium: 134 mmol/L — ABNORMAL LOW (ref 135–145)
Total Bilirubin: 0.7 mg/dL (ref 0.3–1.2)
Total Protein: 7.5 g/dL (ref 6.5–8.1)

## 2022-10-11 NOTE — Progress Notes (Addendum)
[facility]  [address] [place],  [place]  [address] [phone]  Clinic Day:  10/11/22  Referring physician: Marylen Ponto, MD    CHIEF COMPLAINT:  CC: History of stage IA hormone receptor positive left breast cancer  Current Treatment:  Surveillance  HISTORY OF PRESENT ILLNESS:  Judith Solis is a 77 y.o. female who we began seeing in August 2014 for follow-up of breast cancer, when she moved to the area from [place].  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  She was treated with lumpectomy.  Pathology revealed a 1.5 cm, grade 3, invasive ductal carcinoma with a negative nodes.  Estrogen and progesterone receptors were positive and her 2 Neu negative.  Oncotype DX score was 25, which is in an intermediate risk category.  She received adjuvant chemotherapy with Taxotere and Cytoxan for 3 months, followed by adjuvant radiation to the left breast, as well as Arimidex for 5 years completed in March 2015.  She is here for yearly routine follow-up and states she has been doing fairly well.  She denies any changes in her breasts.  Prior to her visit today she did undergo bilateral diagnostic mammogram.  She has had problems with severe bladder/urethral/vaginal pressure with urinary frequency.  She was seen by Dr. Sherron Monday and underwent cystoscopy, which was negative.  He felt her symptoms were most likely due to vaginal dryness.  Her symptoms did not improve with Vesicare.  In 2016, she saw Dr. Wille Glaser for cardiac evaluation and was found to have congestive heart failure, as well as aortic stenosis.  She was placed on losartan/hydrochlorothiazide 50/12.5 daily.  She had an echocardiogram at that time.  She had shoulder surgery in 07/21/2015.  She had osteopenia on bone density in March 2015.  Repeat bone density scan in 2017 revealed persistent osteopenia.  There was a 6% worsening of  her spine, but a 3% improvement of her left forearm.  Her husband passed away in 12/29/18after a prolonged illness.  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  She states that she has had two sisters who had breast cancer, and had to undergo bilateral mastectomies.  She also had a sister who died of lung cancer at age 79.  One of the sisters that had breast cancer had advanced bladder cancer and required total cystectomy.  So far genetic testing has been negative on family members.  INTERVAL HISTORY:  Judith Solis is here for annual follow up and states that she has been doing well. Annual mammogram from February was clear. She continues oral calcium and vitamin D daily. She had a minor fall at church but no major injury, just a stubbed toe. Her  appetite is good, and her weight has remained stable since her last visit.  She denies fever, chills or other signs of infection.  She denies nausea, vomiting, bowel issues, or abdominal pain.  She denies sore throat, cough, dyspnea, or chest pain.   REVIEW OF SYSTEMS:  Review of Systems  Constitutional: Negative.  Negative for appetite change, chills, fatigue, fever and unexpected weight change.  HENT:  Negative.    Eyes: Negative.   Respiratory: Negative.  Negative for chest tightness, cough, hemoptysis, shortness of breath and wheezing.   Cardiovascular: Negative.  Negative for chest pain, leg swelling and palpitations.  Gastrointestinal: Negative.  Negative for abdominal distention, abdominal pain, blood in stool, constipation, diarrhea, nausea and vomiting.  Endocrine: Negative.   Genitourinary: Negative.  Negative for difficulty urinating, dysuria, frequency and hematuria.   Musculoskeletal:  Positive for arthralgias (mainly of the bilateral hands). Negative for back pain, flank pain, gait problem and myalgias.  Skin: Negative.   Neurological: Negative.  Negative for  dizziness, extremity weakness, gait problem, headaches, light-headedness, numbness, seizures and speech difficulty.  Hematological: Negative.   Psychiatric/Behavioral: Negative.  Negative for depression and sleep disturbance. The patient is not nervous/anxious.      VITALS:  Blood pressure 122/77, pulse 77, temperature 98.7 F (37.1 C), temperature source Oral, resp. rate 14, height 5\' 5"  (1.651 m), weight 211 lb 11.2 oz (96 kg), SpO2 97 %.  Wt Readings from Last 3 Encounters:  10/11/22 211 lb 11.2 oz (96 kg)  06/28/22 211 lb (95.7 kg)  03/28/22 212 lb 12.8 oz (96.5 kg)    Body mass index is 35.23 kg/m.  Performance status (ECOG): 1 - Symptomatic but completely ambulatory  PHYSICAL EXAM:  Physical Exam Constitutional:      General: She is not in acute distress.    Appearance: Normal appearance. She is normal weight.  HENT:     Head: Normocephalic and atraumatic.  Eyes:     General: No scleral icterus.    Extraocular Movements: Extraocular movements intact.     Conjunctiva/sclera: Conjunctivae normal.     Pupils: Pupils are equal, round, and reactive to light.  Cardiovascular:     Rate and Rhythm: Normal rate and regular rhythm.     Pulses: Normal pulses.     Heart sounds: Normal heart sounds. No murmur heard.    No friction rub. No gallop.  Pulmonary:     Effort: Pulmonary effort is normal. No respiratory distress.     Breath sounds: Normal breath sounds.  Chest:  Breasts:    Right: Normal.     Left: Normal.     Comments: Large scar in the upper sternum, which is well healed. Scar in the lower inner quadrant of the left breast which is faded but slightly bumpy and slightly tender. No masses in either breast.  Abdominal:     General: Bowel sounds are normal. There is no distension.     Palpations: Abdomen is soft. There is no hepatomegaly, splenomegaly or mass.     Tenderness: There is no abdominal tenderness.  Musculoskeletal:        General: Normal range of motion.      Cervical back: Normal range of motion and neck supple.     Right lower leg: No edema.     Left lower leg: No edema.     Comments: Swelling of her PIPs and DIPs of the bilateral hands   Lymphadenopathy:     Cervical: No cervical adenopathy.  Skin:    General: Skin is warm and dry.     Comments: Prominent scar in the upper anterior right chest.  Neurological:     General: No focal deficit present.     Mental Status: She is alert and oriented to person, place, and time. Mental status is at baseline.  Psychiatric:        Mood and Affect: Mood normal.        Behavior: Behavior normal.        Thought Content: Thought content normal.        Judgment: Judgment normal.     LABS:  Latest Ref Rng & Units 10/11/2022    3:45 PM 09/25/2021   12:00 AM 09/08/2021    1:48 PM  CBC  WBC 4.0 - 10.5 K/uL 7.6  5.9  6.1   Hemoglobin 12.0 - 15.0 g/dL 06.2  37.6  28.3   Hematocrit 36.0 - 46.0 % 41.6  42  38.7   Platelets 150 - 400 K/uL 263  250  244       Latest Ref Rng & Units 10/11/2022    3:45 PM 06/28/2022    9:33 AM 02/26/2022   10:27 AM  CMP  Glucose 70 - 99 mg/dL 151  761  86   BUN 8 - 23 mg/dL 20  19  22    Creatinine 0.44 - 1.00 mg/dL 6.07  3.71  0.62   Sodium 135 - 145 mmol/L 134  138  136   Potassium 3.5 - 5.1 mmol/L 3.9  4.9  4.1   Chloride 98 - 111 mmol/L 99  101  100   CO2 22 - 32 mmol/L 28  24  25    Calcium 8.9 - 10.3 mg/dL 9.1  9.7  9.9   Total Protein 6.5 - 8.1 g/dL 7.5  6.8    Total Bilirubin 0.3 - 1.2 mg/dL 0.7  0.5    Alkaline Phos 38 - 126 U/L 72  80    AST 15 - 41 U/L 38  37    ALT 0 - 44 U/L 61  58      STUDIES:  EXAM:09/12/22 DIGITAL SCREENING BILATERAL MAMMOGRAM WITH TOMOSYNTHESIS AND CAD  IMPRESSION: No mammographic evidence of malignancy.   Allergies:  Allergies  Allergen Reactions   Entresto [Sacubitril-Valsartan] Itching   Plavix [Clopidogrel Bisulfate] Itching   Codeine Nausea And Vomiting   Oxycodone-Acetaminophen Other (See Comments)     Hallucinations    Current Medications: Current Outpatient Medications  Medication Sig Dispense Refill   albuterol (VENTOLIN HFA) 108 (90 Base) MCG/ACT inhaler SMARTSIG:2 Puff(s) By Mouth Every 4 Hours PRN     aspirin EC 81 MG tablet Take 1 tablet (81 mg total) by mouth daily. Swallow whole. 90 tablet 3   atorvastatin (LIPITOR) 20 MG tablet Take 20 mg by mouth at bedtime.     CALCIUM CITRATE PO Take 2,000 Units by mouth 2 (two) times daily.     Cholecalciferol (VITAMIN D3) 50 MCG (2000 UT) TABS Take 1 tablet by mouth daily.     citalopram (CELEXA) 20 MG tablet Take 20 mg by mouth daily.     clobetasol cream (TEMOVATE) 0.05 % Apply 1 application topically 2 (two) times daily as needed (vaginal irritation).     estradiol (ESTRACE) 0.1 MG/GM vaginal cream Place 0.1 Applicatorfuls vaginally 3 (three) times a week.     magnesium gluconate (MAGONATE) 500 MG tablet Take 500 mg by mouth daily.     Multiple Vitamin (MULTIVITAMIN) tablet Take 1 tablet by mouth daily.     pantoprazole (PROTONIX) 40 MG tablet Take 40 mg by mouth daily.     Probiotic Product (PROBIOTIC-10 PO) Take 1 tablet by mouth every other day.     valACYclovir (VALTREX) 1000 MG tablet Take 1,000 mg by mouth daily.     valsartan (DIOVAN) 40 MG tablet Take 1 tablet (40 mg total) by mouth 2 (two) times daily. 180 tablet 3   furosemide (LASIX) 20 MG tablet TAKE 1 TABLET BY MOUTH EVERY DAY 90 tablet 3   No current facility-administered medications for this visit.     ASSESSMENT &  PLAN:   Assessment:   1.  Remote history of stage IA hormone receptor positive breast cancer, diagnosed in July 2009.  She remains without evidence of recurrence.    2.  Osteopenia for which she is taking oral calcium and vitamin D. Bone density scans are scheduled through Dr. Leonor Liv.    Plan: As she continues to do well, we will plan to see her back in 1 year with bilateral screening mammogram along with CBC and CMP. She is a candidate for our longterm  survivorship clinic.  The patient understands the plans discussed today and is in agreement with them.  She knows to contact our office if she develops concerns regarding her breast cancer.   I provided 15 minutes of face-to-face time during this this encounter and > 50% was spent counseling as documented under my assessment and plan.     I,Gabriella Ballesteros,acting as a scribe for Dellia Beckwith, MD.,have documented all relevant documentation on the behalf of Dellia Beckwith, MD,as directed by  Dellia Beckwith, MD while in the presence of Dellia Beckwith, MD.

## 2022-10-11 NOTE — Telephone Encounter (Signed)
Patient has been scheduled for follow-up visit per 10/11/22 LOS.  Pt given an appt calendar with date and time.

## 2022-10-12 ENCOUNTER — Telehealth: Payer: Self-pay

## 2022-10-12 NOTE — Telephone Encounter (Signed)
-----   Message from Derwood Kaplan, MD sent at 10/11/2022  7:46 PM EDT ----- Regarding: call Tell her labs are good/stable. Pls send copies to Dr. Helene Kelp

## 2022-10-20 ENCOUNTER — Other Ambulatory Visit: Payer: Self-pay | Admitting: Cardiology

## 2022-10-22 NOTE — Telephone Encounter (Signed)
Refills to pharmacy 

## 2022-10-29 DIAGNOSIS — H10023 Other mucopurulent conjunctivitis, bilateral: Secondary | ICD-10-CM | POA: Diagnosis not present

## 2022-10-29 DIAGNOSIS — R0981 Nasal congestion: Secondary | ICD-10-CM | POA: Diagnosis not present

## 2022-10-29 DIAGNOSIS — J069 Acute upper respiratory infection, unspecified: Secondary | ICD-10-CM | POA: Diagnosis not present

## 2022-11-05 ENCOUNTER — Telehealth: Payer: Self-pay | Admitting: Oncology

## 2022-11-05 NOTE — Telephone Encounter (Signed)
Contacted pt via phone. Unable to reach, voicemail was left requesting she contact scheduling.   appt Received: Gaspar Skeeters, Gardiner Fanti, MD sent to Thomasena Edis; Otilio Miu; Mosher, Cameron Proud, PA-C She is a candidate for 'longterm survivorship clinic with Har[facility] Hazel if she is in agreement

## 2022-12-19 DIAGNOSIS — M19041 Primary osteoarthritis, right hand: Secondary | ICD-10-CM | POA: Diagnosis not present

## 2022-12-24 NOTE — Progress Notes (Unsigned)
Cardiology Office Note:    Date:  12/25/2022   ID:  Judith Solis, [place] [birth year], MRN [id number]  PCP:  Marylen Ponto, MD  Cardiologist:  Norman Herrlich, MD    Referring MD: Marylen Ponto, MD    ASSESSMENT:    1. Hypertensive heart disease with chronic combined systolic and diastolic congestive heart failure (HCC)   2. Left bundle branch block   3. S/P TAVR (transcatheter aortic valve replacement)   4. Pure hypercholesterolemia    PLAN:    In order of problems listed above:  Judith Solis continues to do well from a cardiology, she has had successful TAVR surgery with long-lasting good result but remains mildly short of breath with activity.  Residual diastolic dysfunction she has no fluid overload on her current diuretic her blood pressure is at target in my opinion is optimized for planned orthopedic surgery and anesthesia however in view of her cardiac history I think she is best served by staying overnight in observation any problems please contact cardiology. Lets recheck a echocardiogram post-TAVR surveillance Stable EKG pattern without progressive conduction system disease Continue her statin she has upcoming labs with PCP as a wellness exam and she will continue her ARB for hypertension   Next appointment: 6 months   Medication Adjustments/Labs and Tests Ordered: Current medicines are reviewed at length with the patient today.  Concerns regarding medicines are outlined above.  Orders Placed This Encounter  Procedures   EKG 12-Lead   ECHOCARDIOGRAM COMPLETE   No orders of the defined types were placed in this encounter.   Chief complaint I anticipate having shoulder surgery   History of Present Illness:    Judith Solis is a 77 y.o. female with a hx of hypertensive heart disease with chronic combined systolic and diastolic heart failure left bundle branch block and previous TAVR last seen 12/ 01/2022.  She anticipates shoulder surgery with reversal at [facility]. From a cardiology perspective is doing better strength and endurance has improved she still has exertional shortness of breath but not severe or limiting no edema orthopnea chest pain palpitation or syncope She is due for screening echocardiogram post TAVR no clinical indication of valve dysfunction Has a wellness exam next week.  Her last lipid profile 06/28/2022 LDL 90 cholesterol 161 triglycerides 155 most recent labs 10/11/2022 hemoglobin 13.7 creatinine 0.99 potassium 3.9  Compliance with diet, lifestyle and medications: Yes Past Medical History:  Diagnosis Date   Aortic valve regurgitation    Arthritis    Asthma    Breast cancer (HCC)    a. 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   26 mm Edwards Sapien 3 transcatheter heart valve placed via percutaneous right transfemoral approach    Severe aortic stenosis     Past Surgical History:  Procedure Laterality Date   ABDOMINAL HYSTERECTOMY     BELPHAROPTOSIS REPAIR Bilateral    BREAST SURGERY     LUMPECTOMY WITH CHEMO & RADIATION   CATARACT EXTRACTION     EYE SURGERY     INTRAOPERATIVE TRANSTHORACIC ECHOCARDIOGRAM  03/04/2018   Procedure: INTRAOPERATIVE TRANSTHORACIC ECHOCARDIOGRAM;  Surgeon: Tonny Bollman, MD;  Location: [facility] OR;  Service: Open Heart Surgery;;  JOINT REPLACEMENT     BILATERAL HIPS 2007 & 2008   RIGHT/LEFT HEART CATH AND CORONARY ANGIOGRAPHY N/A 01/31/2018   Procedure: RIGHT/LEFT HEART CATH AND CORONARY ANGIOGRAPHY;  Surgeon: Kathleene Hazel, MD;  Location: [facility] INVASIVE CV LAB;  Service: Cardiovascular;  Laterality: N/A;   ROTATOR CUFF REPAIR     TONSILLECTOMY     TRANSCATHETER AORTIC VALVE REPLACEMENT, TRANSFEMORAL N/A 03/04/2018    Procedure: TRANSCATHETER AORTIC VALVE REPLACEMENT, TRANSFEMORAL. EDWARDS SAPIEN 3 TRANSCATHETER HEART VALVE.;  Surgeon: Tonny Bollman, MD;  Location: [facility] OR;  Service: Open Heart Surgery;  Laterality: N/A;    Current Medications: Current Meds  Medication Sig   albuterol (VENTOLIN HFA) 108 (90 Base) MCG/ACT inhaler Inhale 2 puffs into the lungs every 4 (four) hours as needed for wheezing or shortness of breath.   aspirin EC 81 MG tablet Take 1 tablet (81 mg total) by mouth daily. Swallow whole.   atorvastatin (LIPITOR) 20 MG tablet Take 20 mg by mouth at bedtime.   CALCIUM CITRATE PO Take 2,000 Units by mouth 2 (two) times daily.   Cholecalciferol (VITAMIN D3) 50 MCG (2000 UT) TABS Take 1 tablet by mouth daily.   citalopram (CELEXA) 20 MG tablet Take 20 mg by mouth daily.   clobetasol cream (TEMOVATE) 0.05 % Apply 1 application topically 2 (two) times daily as needed (vaginal irritation).   estradiol (ESTRACE) 0.1 MG/GM vaginal cream Place 0.1 Applicatorfuls vaginally 3 (three) times a week.   furosemide (LASIX) 20 MG tablet TAKE 1 TABLET BY MOUTH EVERY DAY   magnesium gluconate (MAGONATE) 500 MG tablet Take 500 mg by mouth daily.   meloxicam (MOBIC) 15 MG tablet Take 15 mg by mouth daily.   Multiple Vitamin (MULTIVITAMIN) tablet Take 1 tablet by mouth daily.   pantoprazole (PROTONIX) 40 MG tablet Take 40 mg by mouth daily.   Probiotic Product (PROBIOTIC-10 PO) Take 1 tablet by mouth every other day.   valACYclovir (VALTREX) 1000 MG tablet Take 1,000 mg by mouth daily.   valsartan (DIOVAN) 40 MG tablet Take 1 tablet (40 mg total) by mouth 2 (two) times daily.     Allergies:   Entresto [sacubitril-valsartan], Plavix [clopidogrel bisulfate], Codeine, and Oxycodone-acetaminophen   Social History   Socioeconomic History   Marital status: Widowed    Spouse name: Not on file   Number of children: 1   Years of education: Not on file   Highest education level: GED or equivalent   Occupational History   Not on file  Tobacco Use   Smoking status: Former    Packs/day: 1.50    Years: 20.00    Additional pack years: 0.00    Total pack years: 30.00    Types: Cigarettes    Quit date: 06/04/1990    Years since quitting: 32.5    Passive exposure: Past   Smokeless tobacco: Never  Vaping Use   Vaping Use: Never used  Substance and Sexual Activity   Alcohol use: No    Comment: none since 1985   Drug use: No   Sexual activity: Not on file  Other Topics Concern   Not on file  Social History Narrative   Lives at home alone   Right handed   Caffeine: 1-2 cups/day of coffee   Social Determinants of Health   Financial Resource Strain: Not on file  Food Insecurity: No Food Insecurity (06/28/2021)   Hunger Vital Sign    Worried About Running Out of Food in the Last Year: Never true  Ran Out of Food in the Last Year: Never true  Transportation Needs: No Transportation Needs (06/28/2021)   PRAPARE - Administrator, Civil Service (Medical): No    Lack of Transportation (Non-Medical): No  Physical Activity: Not on file  Stress: Not on file  Social Connections: Not on file     Family History: The patient's family history includes Bladder Cancer (age of onset: 2) in her sister; Breast cancer in her cousin and cousin; Breast cancer (age of onset: 54) in her sister; Breast cancer (age of onset: 102) in her sister; Cancer in her niece; Diabetes Mellitus II in her paternal grandfather, sister, and sister; Heart Problems in her brother and sister; Heart disease in her brother, maternal grandfather, and mother; Hepatitis C in her sister; Kidney cancer in her niece; Lung cancer in her cousin and paternal uncle; Lung cancer (age of onset: 23) in her sister; Stroke in her father; Throat cancer in her sister. There is no history of Sleep apnea. ROS:   Please see the history of present illness.    All other systems reviewed and are negative.  EKGs/Labs/Other Studies  Reviewed:    The following studies were reviewed today:  Cardiac Studies & Procedures   CARDIAC CATHETERIZATION  CARDIAC CATHETERIZATION 01/31/2018  Narrative 1. No angiographic evidence of CAD 2. Severe aortic stenosis (mean gradient 53. 2 mmHg, peak to peak gradient 57 mmHg, AVA 0.6 cm2)  Recommendations: Will proceed with workup for TAVR vs AVR. Will have our team arrange scans and then see Dr. Laneta Simmers or Dr. Cornelius Moras to review surgical AVR vs TAVR.  Findings Coronary Findings Diagnostic  Dominance: Left  Left Anterior Descending Vessel is large.  Left Circumflex  First Obtuse Marginal Branch Vessel is large in size.  Right Coronary Artery Vessel is small.  Intervention  No interventions have been documented.     ECHOCARDIOGRAM  ECHOCARDIOGRAM COMPLETE 09/11/2021  Narrative ECHOCARDIOGRAM REPORT    Patient Name:   MACAYLE CACKOWSKI Date of Exam: 09/11/2021 Medical Rec #:  [id number]     Height:       65.0 in Accession #:    [id number]    Weight:       217.0 lb Date of Birth:  July 03, 1946     BSA:          2.048 m Patient Age:    77 years      BP:           138/84 mmHg Patient Gender: F             HR:           67 bpm. Exam Location:  [place]  Procedure: 2D Echo, Cardiac Doppler, Color Doppler and Strain Analysis  Indications:    SOB (shortness of breath) [R06.02 (ICD-10-CM)]; S/P TAVR (transcatheter aortic valve replacement) [Z95.2 (ICD-10-CM)]; Essential hypertension [I10 (ICD-10-CM)]; Pure hypercholesterolemia [E78.00 (ICD-10-CM)]  History:        Patient has prior history of Echocardiogram examinations, most recent 11/08/2020. CHF, Aortic valve regurgitation, Arrythmias:PVC, Signs/Symptoms:Morbid obesity; Risk Factors:Hypertension and Dyslipidemia. Aortic Valve: 26 mm Sapien prosthetic, stented (TAVR) valve is present in the aortic position. Procedure Date: 2019.  Sonographer:    Louie Boston RDCS Referring Phys: [id number] Mandy Fitzwater J Natalya Domzalski  IMPRESSIONS   1.  GLS -9.8. Left ventricular ejection fraction, by estimation, is 45 to 50%. The left ventricle has mildly decreased function. The left ventricle has no regional wall motion abnormalities. There is mild left ventricular hypertrophy. Left  ventricular diastolic parameters are consistent with Grade I diastolic dysfunction (impaired relaxation). 2. Right ventricular systolic function is normal. The right ventricular size is normal. There is normal pulmonary artery systolic pressure. 3. The mitral valve is normal in structure. Mild mitral valve regurgitation. No evidence of mitral stenosis. 4. Prostetic parameters similar to echos from 2022. The aortic valve has been repaired/replaced. Aortic valve regurgitation is not visualized. Mild aortic valve stenosis. There is a 26 mm Sapien prosthetic (TAVR) valve present in the aortic position. Procedure Date: 2019. Aortic valve mean gradient measures 13.0 mmHg. 5. There is mild dilatation of the aortic root, measuring 38 mm. 6. The inferior vena cava is normal in size with greater than 50% respiratory variability, suggesting right atrial pressure of 3 mmHg.  FINDINGS Left Ventricle: GLS -9.8. Left ventricular ejection fraction, by estimation, is 45 to 50%. The left ventricle has mildly decreased function. The left ventricle has no regional wall motion abnormalities. The left ventricular internal cavity size was normal in size. There is mild left ventricular hypertrophy. Left ventricular diastolic parameters are consistent with Grade I diastolic dysfunction (impaired relaxation).  Right Ventricle: The right ventricular size is normal. No increase in right ventricular wall thickness. Right ventricular systolic function is normal. There is normal pulmonary artery systolic pressure. The tricuspid regurgitant velocity is 2.51 m/s, and with an assumed right atrial pressure of 8 mmHg, the estimated right ventricular systolic pressure is 33.2 mmHg.  Left Atrium: Left  atrial size was normal in size.  Right Atrium: Right atrial size was normal in size.  Pericardium: There is no evidence of pericardial effusion.  Mitral Valve: The mitral valve is normal in structure. Mild mitral valve regurgitation. No evidence of mitral valve stenosis.  Tricuspid Valve: The tricuspid valve is normal in structure. Tricuspid valve regurgitation is mild . No evidence of tricuspid stenosis.  Aortic Valve: Prostetic parameters similar to echos from 2022. The aortic valve has been repaired/replaced. Aortic valve regurgitation is not visualized. Mild aortic stenosis is present. Aortic valve mean gradient measures 13.0 mmHg. Aortic valve peak gradient measures 23.8 mmHg. Aortic valve area, by VTI measures 1.03 cm. There is a 26 mm Sapien prosthetic, stented (TAVR) valve present in the aortic position. Procedure Date: 2019.  Pulmonic Valve: The pulmonic valve was normal in structure. Pulmonic valve regurgitation is not visualized. No evidence of pulmonic stenosis.  Aorta: The aortic root is normal in size and structure. There is mild dilatation of the aortic root, measuring 38 mm.  Venous: The inferior vena cava is normal in size with greater than 50% respiratory variability, suggesting right atrial pressure of 3 mmHg.  IAS/Shunts: No atrial level shunt detected by color flow Doppler.   LEFT VENTRICLE PLAX 2D LVIDd:         4.10 cm     Diastology LVIDs:         3.30 cm     LV e' medial:    3.31 cm/s LV PW:         1.30 cm     LV E/e' medial:  29.6 LV IVS:        1.30 cm     LV e' lateral:   4.56 cm/s LVOT diam:     2.00 cm     LV E/e' lateral: 21.5 LV SV:         56 LV SV Index:   27 LVOT Area:     3.14 cm  LV Volumes (MOD) LV vol d, MOD A2C:  60.6 ml LV vol d, MOD A4C: 84.6 ml LV vol s, MOD A2C: 32.5 ml LV vol s, MOD A4C: 45.5 ml LV SV MOD A2C:     28.1 ml LV SV MOD A4C:     84.6 ml LV SV MOD BP:      34.5 ml  RIGHT VENTRICLE            IVC RV S prime:     9.34  cm/s  IVC diam: 1.70 cm TAPSE (M-mode): 1.9 cm  LEFT ATRIUM             Index        RIGHT ATRIUM           Index LA diam:        3.60 cm 1.76 cm/m   RA Area:     10.40 cm LA Vol (A2C):   86.2 ml 42.09 ml/m  RA Volume:   21.10 ml  10.30 ml/m LA Vol (A4C):   45.6 ml 22.27 ml/m LA Biplane Vol: 62.2 ml 30.37 ml/m AORTIC VALVE AV Area (Vmax):    1.14 cm AV Area (Vmean):   1.12 cm AV Area (VTI):     1.03 cm AV Vmax:           244.00 cm/s AV Vmean:          169.000 cm/s AV VTI:            0.540 m AV Peak Grad:      23.8 mmHg AV Mean Grad:      13.0 mmHg LVOT Vmax:         88.80 cm/s LVOT Vmean:        60.300 cm/s LVOT VTI:          0.177 m LVOT/AV VTI ratio: 0.33  AORTA Ao Root diam: 3.60 cm Ao Asc diam:  3.80 cm Ao Desc diam: 2.30 cm  MITRAL VALVE                TRICUSPID VALVE MV Area (PHT): 2.45 cm     TR Peak grad:   25.2 mmHg MV Decel Time: 310 msec     TR Vmax:        251.00 cm/s MV E velocity: 98.10 cm/s MV A velocity: 126.00 cm/s  SHUNTS MV E/A ratio:  0.78         Systemic VTI:  0.18 m Systemic Diam: 2.00 cm  Gypsy Balsam MD Electronically signed by Gypsy Balsam MD Signature Date/Time: 09/11/2021/4:51:17 PM    Final     CT SCANS  CT CORONARY MORPH W/CTA COR W/SCORE 02/14/2018  Addendum 02/14/2018  8:57 AM ADDENDUM REPORT: 02/14/2018 08:54  EXAM: OVER-READ INTERPRETATION  CT CHEST  The following report is an over-read performed by radiologist Dr. Lesia Hausen [facility] on 02/14/2018. This over-read does not include interpretation of cardiac or coronary anatomy or pathology. The coronary CTA interpretation by the cardiologist is attached.  COMPARISON:  05/30/2015 chest CT angiogram. 01/27/2018 chest radiograph.  FINDINGS: Please see the separate concurrent dedicated chest CT angiogram report for details. No acute consolidative airspace disease, lung masses or significant pulmonary nodules. No thoracic  adenopathy.  IMPRESSION: Please see the separate concurrent dedicated chest CT angiogram report for details. No significant extracardiac findings.   Electronically Signed By: Delbert Phenix M.D. On: 02/14/2018 08:54  Narrative CLINICAL DATA:  Aortic Stenosis  EXAM: Cardiac TAVR CT  TECHNIQUE: The patient was scanned on a Siemens Force 192 slice  scanner. A 120 kV retrospective scan was triggered in the ascending thoracic aorta at 140 HU's. Gantry rotation speed was 250 msecs and collimation was .6 mm. No beta blockade or nitro were given. The 3D data set was reconstructed in 5% intervals of the R-R cycle. Systolic and diastolic phases were analyzed on a dedicated work station using MPR, MIP and VRT modes. The patient received 80 cc of contrast.  FINDINGS: Aortic Valve: Tri-leaflet with spherical annulus Somewhat less calcified than typically seen for TAVR.  Aorta: No aneurysm mild calcific aortic atherosclerosis  Sino-tubular Junction: 28 mm  Ascending Thoracic Aorta: 36 mm  Aortic Arch: 27 mm  Descending Thoracic Aorta: 24 mm  Sinus of Valsalva Measurements:  Non-coronary: 34.3 mm  Right - coronary: 32 mm  Left -   coronary: 31.2 mm  Coronary Artery Height above Annulus:  Left Main: 14.1 mm above annulus  Right Coronary: 18.8 mm above annulus  Virtual Basal Annulus Measurements:  Maximum / Minimum Diameter: 25.3 mm x 23.3 mm  Perimeter: 76 mm  Area: 462 mm2  Coronary Arteries: Sufficient height above annulus for deployment  Optimum Fluoroscopic Angle for Delivery: LAO 5 degrees Cranial 16 degrees  IMPRESSION: 1. Tri leaflet AV with annulus 462 mm2 suitable for a 26 mm Sapien 3 valve  2. Optimum angiographic angle for deployment LAO 5 degrees Cranial 16 degrees  3.  Coronary arteries sufficient height above annulus for deployment  4.  Normal aortic root 3.6 cm  5.  No LAA thrombus  Charlton Haws  Electronically Signed: By: Charlton Haws  M.D. On: 02/12/2018 16:03          EKG:  EKG ordered today and personally reviewed.  The ekg ordered today demonstrates sinus rhythm left bundle branch block repolarization changes no atypical appearance  Recent Labs: 02/26/2022: NT-Pro BNP 206 10/11/2022: ALT 61; BUN 20; Creatinine 0.99; Hemoglobin 13.7; Platelet Count 263; Potassium 3.9; Sodium 134  Recent Lipid Panel    Component Value Date/Time   CHOL 161 06/28/2022 0933   TRIG 159 (H) 06/28/2022 0933   HDL 43 06/28/2022 0933   CHOLHDL 3.7 06/28/2022 0933   LDLCALC 90 06/28/2022 0933    Physical Exam:    VS:  BP 112/80 (BP Location: Right Arm, Patient Position: Sitting)   Pulse 80   Ht 5\' 5"  (1.651 m)   Wt 212 lb (96.2 kg)   SpO2 93%   BMI 35.28 kg/m     Wt Readings from Last 3 Encounters:  12/25/22 212 lb (96.2 kg)  10/11/22 211 lb 11.2 oz (96 kg)  06/28/22 211 lb (95.7 kg)     GEN:  Well nourished, well developed in no acute distress HEENT: Normal NECK: No JVD; No carotid bruits LYMPHATICS: No lymphadenopathy CARDIAC: Paradoxical second heart sound no murmur RRR,  RESPIRATORY:  Clear to auscultation without rales, wheezing or rhonchi  ABDOMEN: Soft, non-tender, non-distended MUSCULOSKELETAL:  No edema; No deformity  SKIN: Warm and dry NEUROLOGIC:  Alert and oriented x 3 PSYCHIATRIC:  Normal affect    Signed, Norman Herrlich, MD  12/25/2022 8:40 AM     [facility]

## 2022-12-25 ENCOUNTER — Ambulatory Visit: Payer: Medicare Other | Attending: Cardiology | Admitting: Cardiology

## 2022-12-25 ENCOUNTER — Encounter: Payer: Self-pay | Admitting: Cardiology

## 2022-12-25 VITALS — BP 112/80 | HR 80 | Ht 65.0 in | Wt 212.0 lb

## 2022-12-25 DIAGNOSIS — Z952 Presence of prosthetic heart valve: Secondary | ICD-10-CM | POA: Diagnosis not present

## 2022-12-25 DIAGNOSIS — E7800 Pure hypercholesterolemia, unspecified: Secondary | ICD-10-CM | POA: Insufficient documentation

## 2022-12-25 DIAGNOSIS — I5042 Chronic combined systolic (congestive) and diastolic (congestive) heart failure: Secondary | ICD-10-CM | POA: Diagnosis not present

## 2022-12-25 DIAGNOSIS — I110 Hypertensive heart disease with heart failure: Secondary | ICD-10-CM | POA: Diagnosis not present

## 2022-12-25 DIAGNOSIS — I447 Left bundle-branch block, unspecified: Secondary | ICD-10-CM | POA: Insufficient documentation

## 2022-12-25 NOTE — Patient Instructions (Signed)
Medication Instructions:  Your physician recommends that you continue on your current medications as directed. Please refer to the Current Medication list given to you today.  *If you need a refill on your cardiac medications before your next appointment, please call your pharmacy*   Lab Work: None If you have labs (blood work) drawn today and your tests are completely normal, you will receive your results only by: MyChart Message (if you have MyChart) OR A paper copy in the mail If you have any lab test that is abnormal or we need to change your treatment, we will call you to review the results.   Testing/Procedures: Your physician has requested that you have an echocardiogram. Echocardiography is a painless test that uses sound waves to create images of your heart. It provides your doctor with information about the size and shape of your heart and how well your heart's chambers and valves are working. This procedure takes approximately one hour. There are no restrictions for this procedure. Please do NOT wear cologne, perfume, aftershave, or lotions (deodorant is allowed). Please arrive 15 minutes prior to your appointment time.    Follow-Up: At [facility], you and your health needs are our priority.  As part of our continuing mission to provide you with exceptional heart care, we have created designated Provider Care Teams.  These Care Teams include your primary Cardiologist (physician) and Advanced Practice Providers (APPs -  Physician Assistants and Nurse Practitioners) who all work together to provide you with the care you need, when you need it.  We recommend signing up for the patient portal called "MyChart".  Sign up information is provided on this After Visit Summary.  MyChart is used to connect with patients for Virtual Visits (Telemedicine).  Patients are able to view lab/test results, encounter notes, upcoming appointments, etc.  Non-urgent messages can be sent to your  provider as well.   To learn more about what you can do with MyChart, go to [email or web address].    Your next appointment:   1 year(s)  Provider:   Brian Munley, MD    Other Instructions None  

## 2022-12-26 ENCOUNTER — Ambulatory Visit: Payer: Medicare Other | Attending: Cardiology

## 2022-12-26 DIAGNOSIS — Z952 Presence of prosthetic heart valve: Secondary | ICD-10-CM | POA: Insufficient documentation

## 2022-12-26 DIAGNOSIS — I110 Hypertensive heart disease with heart failure: Secondary | ICD-10-CM | POA: Insufficient documentation

## 2022-12-26 DIAGNOSIS — I5042 Chronic combined systolic (congestive) and diastolic (congestive) heart failure: Secondary | ICD-10-CM | POA: Insufficient documentation

## 2022-12-26 DIAGNOSIS — E7800 Pure hypercholesterolemia, unspecified: Secondary | ICD-10-CM

## 2022-12-26 DIAGNOSIS — I447 Left bundle-branch block, unspecified: Secondary | ICD-10-CM | POA: Diagnosis not present

## 2022-12-26 LAB — ECHOCARDIOGRAM COMPLETE
AR max vel: 1.2 cm2
AV Area VTI: 1.29 cm2
AV Area mean vel: 1.14 cm2
AV Mean grad: 10.5 mmHg
AV Peak grad: 19.6 mmHg
Ao pk vel: 2.22 m/s
Calc EF: 42.3 %
S' Lateral: 3.4 cm
Single Plane A2C EF: 39.5 %
Single Plane A4C EF: 44 %

## 2023-01-11 ENCOUNTER — Other Ambulatory Visit: Payer: Self-pay | Admitting: Cardiology

## 2023-01-16 DIAGNOSIS — Z6835 Body mass index (BMI) 35.0-35.9, adult: Secondary | ICD-10-CM | POA: Diagnosis not present

## 2023-01-16 DIAGNOSIS — M25511 Pain in right shoulder: Secondary | ICD-10-CM | POA: Diagnosis not present

## 2023-01-21 DIAGNOSIS — I447 Left bundle-branch block, unspecified: Secondary | ICD-10-CM | POA: Diagnosis not present

## 2023-01-21 DIAGNOSIS — E559 Vitamin D deficiency, unspecified: Secondary | ICD-10-CM | POA: Diagnosis not present

## 2023-01-21 DIAGNOSIS — Z01818 Encounter for other preprocedural examination: Secondary | ICD-10-CM | POA: Diagnosis not present

## 2023-01-21 DIAGNOSIS — Z79899 Other long term (current) drug therapy: Secondary | ICD-10-CM | POA: Diagnosis not present

## 2023-01-21 DIAGNOSIS — M79609 Pain in unspecified limb: Secondary | ICD-10-CM | POA: Diagnosis not present

## 2023-01-21 DIAGNOSIS — I700 Atherosclerosis of aorta: Secondary | ICD-10-CM | POA: Diagnosis not present

## 2023-01-30 ENCOUNTER — Encounter: Payer: Self-pay | Admitting: Orthopedic Surgery

## 2023-01-30 DIAGNOSIS — R519 Headache, unspecified: Secondary | ICD-10-CM | POA: Diagnosis not present

## 2023-01-31 DIAGNOSIS — M12811 Other specific arthropathies, not elsewhere classified, right shoulder: Secondary | ICD-10-CM | POA: Diagnosis not present

## 2023-01-31 DIAGNOSIS — Z01818 Encounter for other preprocedural examination: Secondary | ICD-10-CM | POA: Diagnosis not present

## 2023-01-31 DIAGNOSIS — M75101 Unspecified rotator cuff tear or rupture of right shoulder, not specified as traumatic: Secondary | ICD-10-CM | POA: Diagnosis not present

## 2023-01-31 DIAGNOSIS — A4901 Methicillin susceptible Staphylococcus aureus infection, unspecified site: Secondary | ICD-10-CM | POA: Diagnosis not present

## 2023-02-07 DIAGNOSIS — J40 Bronchitis, not specified as acute or chronic: Secondary | ICD-10-CM | POA: Diagnosis not present

## 2023-02-07 DIAGNOSIS — R062 Wheezing: Secondary | ICD-10-CM | POA: Diagnosis not present

## 2023-02-07 DIAGNOSIS — Z6834 Body mass index (BMI) 34.0-34.9, adult: Secondary | ICD-10-CM | POA: Diagnosis not present

## 2023-02-07 DIAGNOSIS — J329 Chronic sinusitis, unspecified: Secondary | ICD-10-CM | POA: Diagnosis not present

## 2023-02-15 ENCOUNTER — Telehealth: Payer: Self-pay | Admitting: Cardiology

## 2023-02-15 NOTE — Telephone Encounter (Signed)
Asking that our office send a copy of patient last echo. Fax [phone]. Please advise

## 2023-02-15 NOTE — Telephone Encounter (Signed)
Sent via Epic fax 

## 2023-02-18 DIAGNOSIS — R2689 Other abnormalities of gait and mobility: Secondary | ICD-10-CM | POA: Diagnosis not present

## 2023-02-18 DIAGNOSIS — M19011 Primary osteoarthritis, right shoulder: Secondary | ICD-10-CM | POA: Diagnosis not present

## 2023-02-18 DIAGNOSIS — Z7982 Long term (current) use of aspirin: Secondary | ICD-10-CM | POA: Diagnosis not present

## 2023-02-18 DIAGNOSIS — I509 Heart failure, unspecified: Secondary | ICD-10-CM | POA: Diagnosis not present

## 2023-02-18 DIAGNOSIS — E669 Obesity, unspecified: Secondary | ICD-10-CM | POA: Diagnosis not present

## 2023-02-18 DIAGNOSIS — G8918 Other acute postprocedural pain: Secondary | ICD-10-CM | POA: Diagnosis not present

## 2023-02-18 DIAGNOSIS — Z8673 Personal history of transient ischemic attack (TIA), and cerebral infarction without residual deficits: Secondary | ICD-10-CM | POA: Diagnosis not present

## 2023-02-18 DIAGNOSIS — Z954 Presence of other heart-valve replacement: Secondary | ICD-10-CM | POA: Diagnosis not present

## 2023-02-18 DIAGNOSIS — I10 Essential (primary) hypertension: Secondary | ICD-10-CM | POA: Diagnosis not present

## 2023-02-18 DIAGNOSIS — M12811 Other specific arthropathies, not elsewhere classified, right shoulder: Secondary | ICD-10-CM | POA: Diagnosis not present

## 2023-02-18 DIAGNOSIS — Z471 Aftercare following joint replacement surgery: Secondary | ICD-10-CM | POA: Diagnosis not present

## 2023-02-18 DIAGNOSIS — K219 Gastro-esophageal reflux disease without esophagitis: Secondary | ICD-10-CM | POA: Diagnosis not present

## 2023-02-18 DIAGNOSIS — Z96611 Presence of right artificial shoulder joint: Secondary | ICD-10-CM | POA: Diagnosis not present

## 2023-02-18 DIAGNOSIS — M75111 Incomplete rotator cuff tear or rupture of right shoulder, not specified as traumatic: Secondary | ICD-10-CM | POA: Diagnosis not present

## 2023-02-18 DIAGNOSIS — J45909 Unspecified asthma, uncomplicated: Secondary | ICD-10-CM | POA: Diagnosis not present

## 2023-02-18 DIAGNOSIS — I110 Hypertensive heart disease with heart failure: Secondary | ICD-10-CM | POA: Diagnosis not present

## 2023-02-18 DIAGNOSIS — Z6835 Body mass index (BMI) 35.0-35.9, adult: Secondary | ICD-10-CM | POA: Diagnosis not present

## 2023-02-19 DIAGNOSIS — M19011 Primary osteoarthritis, right shoulder: Secondary | ICD-10-CM | POA: Diagnosis not present

## 2023-02-19 DIAGNOSIS — I110 Hypertensive heart disease with heart failure: Secondary | ICD-10-CM | POA: Diagnosis not present

## 2023-02-19 DIAGNOSIS — R2689 Other abnormalities of gait and mobility: Secondary | ICD-10-CM | POA: Diagnosis not present

## 2023-02-19 DIAGNOSIS — I509 Heart failure, unspecified: Secondary | ICD-10-CM | POA: Diagnosis not present

## 2023-02-19 DIAGNOSIS — M75111 Incomplete rotator cuff tear or rupture of right shoulder, not specified as traumatic: Secondary | ICD-10-CM | POA: Diagnosis not present

## 2023-02-19 DIAGNOSIS — G8918 Other acute postprocedural pain: Secondary | ICD-10-CM | POA: Diagnosis not present

## 2023-02-25 DIAGNOSIS — M6281 Muscle weakness (generalized): Secondary | ICD-10-CM | POA: Diagnosis not present

## 2023-02-25 DIAGNOSIS — M25511 Pain in right shoulder: Secondary | ICD-10-CM | POA: Diagnosis not present

## 2023-03-04 DIAGNOSIS — M6281 Muscle weakness (generalized): Secondary | ICD-10-CM | POA: Diagnosis not present

## 2023-03-04 DIAGNOSIS — M25511 Pain in right shoulder: Secondary | ICD-10-CM | POA: Diagnosis not present

## 2023-03-06 DIAGNOSIS — M6281 Muscle weakness (generalized): Secondary | ICD-10-CM | POA: Diagnosis not present

## 2023-03-06 DIAGNOSIS — M25511 Pain in right shoulder: Secondary | ICD-10-CM | POA: Diagnosis not present

## 2023-03-11 DIAGNOSIS — M25511 Pain in right shoulder: Secondary | ICD-10-CM | POA: Diagnosis not present

## 2023-03-11 DIAGNOSIS — M6281 Muscle weakness (generalized): Secondary | ICD-10-CM | POA: Diagnosis not present

## 2023-03-13 DIAGNOSIS — M6281 Muscle weakness (generalized): Secondary | ICD-10-CM | POA: Diagnosis not present

## 2023-03-13 DIAGNOSIS — M25511 Pain in right shoulder: Secondary | ICD-10-CM | POA: Diagnosis not present

## 2023-03-18 DIAGNOSIS — M6281 Muscle weakness (generalized): Secondary | ICD-10-CM | POA: Diagnosis not present

## 2023-03-18 DIAGNOSIS — M25511 Pain in right shoulder: Secondary | ICD-10-CM | POA: Diagnosis not present

## 2023-03-20 DIAGNOSIS — M6281 Muscle weakness (generalized): Secondary | ICD-10-CM | POA: Diagnosis not present

## 2023-03-20 DIAGNOSIS — M25511 Pain in right shoulder: Secondary | ICD-10-CM | POA: Diagnosis not present

## 2023-03-26 DIAGNOSIS — M8580 Other specified disorders of bone density and structure, unspecified site: Secondary | ICD-10-CM | POA: Diagnosis not present

## 2023-03-26 DIAGNOSIS — I10 Essential (primary) hypertension: Secondary | ICD-10-CM | POA: Diagnosis not present

## 2023-03-26 DIAGNOSIS — M25511 Pain in right shoulder: Secondary | ICD-10-CM | POA: Diagnosis not present

## 2023-03-26 DIAGNOSIS — M6281 Muscle weakness (generalized): Secondary | ICD-10-CM | POA: Diagnosis not present

## 2023-03-26 DIAGNOSIS — Z79899 Other long term (current) drug therapy: Secondary | ICD-10-CM | POA: Diagnosis not present

## 2023-03-26 DIAGNOSIS — E7800 Pure hypercholesterolemia, unspecified: Secondary | ICD-10-CM | POA: Diagnosis not present

## 2023-03-26 DIAGNOSIS — Z6834 Body mass index (BMI) 34.0-34.9, adult: Secondary | ICD-10-CM | POA: Diagnosis not present

## 2023-03-26 DIAGNOSIS — R7309 Other abnormal glucose: Secondary | ICD-10-CM | POA: Diagnosis not present

## 2023-03-26 DIAGNOSIS — Z0000 Encounter for general adult medical examination without abnormal findings: Secondary | ICD-10-CM | POA: Diagnosis not present

## 2023-03-26 DIAGNOSIS — Z1339 Encounter for screening examination for other mental health and behavioral disorders: Secondary | ICD-10-CM | POA: Diagnosis not present

## 2023-03-28 DIAGNOSIS — M6281 Muscle weakness (generalized): Secondary | ICD-10-CM | POA: Diagnosis not present

## 2023-03-28 DIAGNOSIS — M25511 Pain in right shoulder: Secondary | ICD-10-CM | POA: Diagnosis not present

## 2023-04-01 DIAGNOSIS — M6281 Muscle weakness (generalized): Secondary | ICD-10-CM | POA: Diagnosis not present

## 2023-04-01 DIAGNOSIS — M25511 Pain in right shoulder: Secondary | ICD-10-CM | POA: Diagnosis not present

## 2023-04-01 NOTE — Progress Notes (Addendum)
Patient: Judith Solis Date of Birth: 12/29/1945  Reason for Visit: Follow up History from: Patient Primary Neurologist: Judith Solis   ASSESSMENT AND PLAN 77 y.o. year old female   1.  OSA on CPAP (HST 2020 at PCP, new CPAP 2022) -Reviewed CPAP download, has superb compliance.  Will continue current settings.  AHI is slightly increased from a year ago at 6.0.  Will continue to monitor  2.  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  Discussed if any strokelike symptoms occur recommend immediate evaluation. -Strict management of vascular risk factors with a goal BP less than 130/90, A1c less than 7.0, LDL less than 70 for secondary stroke prevention.  She is on aspirin 81 mg daily. -Follow-up in 1 year for CPAP  HISTORY OF PRESENT ILLNESS: Today 04/02/23 Last saw Dr. Frances Solis September 2023 for CPAP follow-up also remote history of incidental cerebellar lacunar stroke. Had right shoulder replacement few weeks ago. Continues to use CPAP nightly, we couldn't pull a download today electronically, she didn't bring her machine. Using 6-7 hours nightly. New machine in 2022. Uses nasal pillow mask. Does continue with daytime drowsiness, could nap easily. She continues to do okay off trazodone, gabapentin, Mirapex.  She remains on aspirin 81 mg daily also on Lipitor. BP 138/83, LDL 90 Dec 2023.  She mentions back in January an episode where she was standing reading a product at the drug store, her vision went black for 10-15 seconds, then returned to normal, she had no other symptoms, was not evaluated.  No further spells.  Addendum 04/16/23 SS: Patient brought her CPAP and, download 03/16/2023-04/14/23 shows 100% usage greater than 4 hours.  Average usage 7 hours 21 minutes. 4-20 cm.  AHI 6.0.  Average time in large leak per day 1 minute 34  seconds.  HISTORY  Judith Solis is a 77 year old right-handed woman with an underlying medical history of hyperlipidemia, breast cancer, reflux disease, aortic stenosis with status post TAVR, hypertension, obstructive sleep apnea, obesity, arthritis, status post bilateral hip replacements, status post lumbar laminectomy, who presents for follow-up consultation of her sleep apnea, on AutoPap therapy.  The patient is a lovely today.  I first met her on 03/23/2021 at the request of her primary care physician, at which time the patient reported nonrestorative sleep and tiredness.  She was on several potentially sedating medications and was advised to talk to her primary care about reducing some of her medications such as her trazodone which was 100 mg at the time.  Her home sleep test through an outside office on 12/27/2018 showed an AHI of 18.4/h, O2 nadir 80%.  She was compliant with her AutoPap therapy at the time. She c/o mental fogginess. We proceeded with a brain MRI. She had a brain MRI with and without contrast on 04/04/2021 and I reviewed the results: Impression: No evidence of acute intracranial abnormality.  Mild to moderate chronic small vessel ischemic changes within the cerebral white matter.  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  Mild generalized cerebral atrophy.   Today, 03/28/2022: I reviewed her AutoPap compliance data from 02/26/2022 through 03/27/2022, which is a total of 30 days, during which time she used her machine every night with percent use days greater than 4 hours at 96.7%, indicating excellent compliance with an average usage of 6 hours  and 56 minutes, residual AHI at goal at 3.4/h, 90th percentile pressure at 8.2 cm, leak on the lower side.  Pressure range of 4 to 20 cm.  She reports overall doing quite well with her machine.  She had a cough recently and her PAP usage was decreased at the time.  She had a cold and cough for most of August.  She ended up with 2  different antibiotics and 2 different inhalers and the steroid inhaler helped her the best she feels.  We talked about her MRI report.  She has no history of strokelike symptoms in the past.  She had a lipid panel on 09/08/2021 which showed a triglyceride level of 275 and LDL of 96.  She takes low-dose atorvastatin, she takes a baby aspirin.  She is on blood pressure medication and blood pressure tends to be good at home.  As far as her mental fogginess, she feels improved, she was successful in coming off of trazodone, gabapentin and pramipexole.  She has had no major repercussions, some residual restless leg symptoms but livable.  REVIEW OF SYSTEMS: Out of a complete 14 system review of symptoms, the patient complains only of the following symptoms, and all other reviewed systems are negative.  See HPI  ALLERGIES: Allergies  Allergen Reactions   Entresto [Sacubitril-Valsartan] Itching   Plavix [Clopidogrel Bisulfate] Itching   Codeine Nausea And Vomiting   Oxycodone-Acetaminophen Other (See Comments)    Hallucinations    HOME MEDICATIONS: Outpatient Medications Prior to Visit  Medication Sig Dispense Refill   albuterol (VENTOLIN HFA) 108 (90 Base) MCG/ACT inhaler Inhale 2 puffs into the lungs every 4 (four) hours as needed for wheezing or shortness of breath.     aspirin EC 81 MG tablet Take 1 tablet (81 mg total) by mouth daily. Swallow whole. 90 tablet 3   atorvastatin (LIPITOR) 20 MG tablet Take 20 mg by mouth at bedtime.     CALCIUM CITRATE PO Take 2,000 Units by mouth daily.     Cholecalciferol (VITAMIN D3) 50 MCG (2000 UT) TABS Take 1 tablet by mouth daily.     citalopram (CELEXA) 20 MG tablet Take 20 mg by mouth daily.     clobetasol cream (TEMOVATE) 0.05 % Apply 1 application topically 2 (two) times daily as needed (vaginal irritation).     estradiol (ESTRACE) 0.1 MG/GM vaginal cream Place 0.1 Applicatorfuls vaginally 3 (three) times a week.     furosemide (LASIX) 20 MG tablet  TAKE 1 TABLET BY MOUTH EVERY DAY 90 tablet 3   magnesium gluconate (MAGONATE) 500 MG tablet Take 500 mg by mouth daily.     meloxicam (MOBIC) 15 MG tablet Take 15 mg by mouth daily.     Multiple Vitamin (MULTIVITAMIN) tablet Take 1 tablet by mouth daily.     pantoprazole (PROTONIX) 40 MG tablet Take 40 mg by mouth daily.     Probiotic Product (PROBIOTIC-10 PO) Take 1 tablet by mouth every other day.     valACYclovir (VALTREX) 1000 MG tablet Take 1,000 mg by mouth daily.     valsartan (DIOVAN) 40 MG tablet Take 1 tablet (40 mg total) by mouth 2 (two) times daily. 180 tablet 3   No facility-administered medications prior to visit.    PAST MEDICAL HISTORY: Past Medical History:  Diagnosis Date   Aortic valve regurgitation    Arthritis    Asthma    Breast cancer (HCC)    a. 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 OR;  Service: Open Heart Surgery;;   JOINT REPLACEMENT     BILATERAL HIPS 2007 & 2008   RIGHT/LEFT HEART CATH AND CORONARY ANGIOGRAPHY N/A 01/31/2018   Procedure: RIGHT/LEFT HEART CATH AND CORONARY ANGIOGRAPHY;  Surgeon: Kathleene Hazel, MD;  Location: [facility] INVASIVE CV LAB;  Service:  Cardiovascular;  Laterality: N/A;   ROTATOR CUFF REPAIR     TONSILLECTOMY     TRANSCATHETER AORTIC VALVE REPLACEMENT, TRANSFEMORAL N/A 03/04/2018   Procedure: TRANSCATHETER AORTIC VALVE REPLACEMENT, TRANSFEMORAL. EDWARDS SAPIEN 3 TRANSCATHETER HEART VALVE.;  Surgeon: Tonny Bollman, MD;  Location[facility] OR;  Service: Open Heart Surgery;  Laterality: N/A;    FAMILY HISTORY: Family History  Problem Relation Age of Onset   Heart disease Mother    Stroke Father    Lung cancer Sister 44       smoking hx   Hepatitis C Sister    Diabetes Mellitus II Sister    Breast cancer Sister 22   Diabetes Mellitus II Sister    Breast cancer Sister 65   Bladder Cancer Sister 95   Heart Problems Sister    Throat cancer Sister    Heart disease Brother    Heart Problems Brother    Heart disease Maternal Grandfather    Diabetes Mellitus II Paternal Grandfather    Lung cancer Paternal Uncle        dx 19s   Breast cancer Cousin        paternal female cousins x2, dx after 32   Breast cancer Cousin        paternal female cousins, unknown #   Lung cancer Cousin        paternal female cousin   Kidney cancer Niece        dx 6s   Cancer Niece        unknown "female" cancer; ? cervical; dx 37s   Sleep apnea Neg Hx     SOCIAL HISTORY: Social History   Socioeconomic History   Marital status: Widowed    Spouse name: Not on file   Number of children: 1   Years of education: Not on file   Highest education level: GED or equivalent  Occupational History   Not on file  Tobacco Use   Smoking status: Former    Current packs/day: 0.00    Average packs/day: 1.5 packs/day for 20.0 years (30.0 ttl pk-yrs)    Types: Cigarettes    Start date: 06/04/1970    Quit date: 06/04/1990    Years since quitting: 32.8    Passive exposure: Past   Smokeless tobacco: Never  Vaping Use   Vaping status: Never Used  Substance and Sexual Activity   Alcohol use: No    Comment: none since 1985   Drug use: No   Sexual  activity: Not on file  Other Topics Concern   Not on file  Social History Narrative   Lives at home alone   Right handed   Caffeine: 1-2 cups/day of coffee   Social Determinants of Health   Financial Resource Strain: Not on file  Food Insecurity: No Food Insecurity (06/28/2021)   Hunger Vital Sign    Worried About Running Out of Food in the Last Year: Never true    Ran Out of Food in the Last Year: Never true  Transportation Needs: No Transportation Needs (06/28/2021)   PRAPARE - Administrator, Civil Service (Medical): No    Lack of Transportation (Non-Medical): No  Physical Activity: Not on file  Stress: Not on file  Social Connections: Not on file  Intimate Partner Violence: Not on file    PHYSICAL EXAM  Vitals:   04/02/23 0936  BP: 138/83  Pulse: (!) 54  Weight: 214 lb (97.1 kg)  Height: 5\' 5"  (1.651 m)   Body mass index is 35.61 kg/m.  Generalized: Well developed, in no acute distress  Neurological examination  Mentation: Alert oriented to time, place, history taking. Follows all commands speech and language fluent Cranial nerve II-XII: Pupils were equal round reactive to light. Extraocular movements were full, visual field were full on confrontational test. Facial sensation and strength were normal. Uvula tongue midline. Head turning and shoulder shrug  were normal and symmetric. Motor: The motor testing reveals 5 over 5 strength of all 4 extremities. Good symmetric motor tone is noted throughout.  Sensory: Sensory testing is intact to soft touch on all 4 extremities. No evidence of extinction is noted.  Coordination: Cerebellar testing reveals good finger-nose-finger and heel-to-shin bilaterally.  Gait and station: Gait is normal. Tandem gait is normal. Romberg is negative. No drift is seen.  Reflexes: Deep tendon reflexes are symmetric and normal bilaterally.   DIAGNOSTIC DATA (LABS, IMAGING, TESTING) - I reviewed patient records, labs, notes, testing  and imaging myself where available.  Lab Results  Component Value Date   WBC 7.6 10/11/2022   HGB 13.7 10/11/2022   HCT 41.6 10/11/2022   MCV 96.5 10/11/2022   PLT 263 10/11/2022      Component Value Date/Time   NA 134 (L) 10/11/2022 1545   NA 138 06/28/2022 0933   K 3.9 10/11/2022 1545   CL 99 10/11/2022 1545   CO2 28 10/11/2022 1545   GLUCOSE 129 (H) 10/11/2022 1545   BUN 20 10/11/2022 1545   BUN 19 06/28/2022 0933   CREATININE 0.99 10/11/2022 1545   CALCIUM 9.1 10/11/2022 1545   PROT 7.5 10/11/2022 1545   PROT 6.8 06/28/2022 0933   ALBUMIN 4.3 10/11/2022 1545   ALBUMIN 4.4 06/28/2022 0933   AST 38 10/11/2022 1545   ALT 61 (H) 10/11/2022 1545   ALKPHOS 72 10/11/2022 1545   BILITOT 0.7 10/11/2022 1545   GFRNONAA 58 (L) 10/11/2022 1545   GFRAA 67 09/06/2020 0937   Lab Results  Component Value Date   CHOL 161 06/28/2022   HDL 43 06/28/2022   LDLCALC 90 06/28/2022   TRIG 159 (H) 06/28/2022   CHOLHDL 3.7 06/28/2022   Lab Results  Component Value Date   HGBA1C 6.3 (H) 02/27/2018   No results found for: "VITAMINB12" Lab Results  Component Value Date   TSH 2.450 09/08/2021  Margie Ege, AGNP-C, DNP 04/02/2023, 9:45 AM [facility] [address] [place], [place] [address] [phone]

## 2023-04-02 ENCOUNTER — Telehealth: Payer: Self-pay

## 2023-04-02 ENCOUNTER — Encounter: Payer: Self-pay | Admitting: Neurology

## 2023-04-02 ENCOUNTER — Ambulatory Visit (INDEPENDENT_AMBULATORY_CARE_PROVIDER_SITE_OTHER): Payer: Medicare Other | Admitting: Neurology

## 2023-04-02 VITALS — BP 138/83 | HR 54 | Ht 65.0 in | Wt 214.0 lb

## 2023-04-02 DIAGNOSIS — I6381 Other cerebral infarction due to occlusion or stenosis of small artery: Secondary | ICD-10-CM | POA: Diagnosis not present

## 2023-04-02 DIAGNOSIS — G4733 Obstructive sleep apnea (adult) (pediatric): Secondary | ICD-10-CM | POA: Diagnosis not present

## 2023-04-02 NOTE — Telephone Encounter (Signed)
Called [employer] and spoke w/representative Natalia Leatherwood) who stated she needs a 5g modem is 3g modem and she needs a new machine as it is only 3g and now they go up to 5g and she will need a new modem but not eligible for new machine has to be 5 years w/machine. Natalia Leatherwood plans to look and see if they have 5g modem that they can send the pt to put in her machine since cannot have new whole machine. Natalia Leatherwood plans to reach out to the pt directly today in regards to this.   Sarah,  With that being said what do you recommend I do in regards to days visit? Sorry for the inconvenience,  [employer]

## 2023-04-02 NOTE — Telephone Encounter (Signed)
Left msg for pt to call back   Needs to bring machine

## 2023-04-02 NOTE — Telephone Encounter (Signed)
Left msg for [employer] manager that we will need download cpap records asap for visit today

## 2023-04-02 NOTE — Patient Instructions (Signed)
Continue CPAP use, we will try to call and get a download, if unable will ask you to bring your machine by for a download.

## 2023-04-03 DIAGNOSIS — M6281 Muscle weakness (generalized): Secondary | ICD-10-CM | POA: Diagnosis not present

## 2023-04-03 DIAGNOSIS — M25511 Pain in right shoulder: Secondary | ICD-10-CM | POA: Diagnosis not present

## 2023-04-03 NOTE — Telephone Encounter (Signed)
called and spoke to pt who stated that she can bring it Monday afternoon

## 2023-04-04 DIAGNOSIS — Z96611 Presence of right artificial shoulder joint: Secondary | ICD-10-CM | POA: Diagnosis not present

## 2023-04-08 ENCOUNTER — Telehealth: Payer: Self-pay

## 2023-04-08 DIAGNOSIS — M6281 Muscle weakness (generalized): Secondary | ICD-10-CM | POA: Diagnosis not present

## 2023-04-08 DIAGNOSIS — M25511 Pain in right shoulder: Secondary | ICD-10-CM | POA: Diagnosis not present

## 2023-04-08 NOTE — Telephone Encounter (Signed)
Call to [facility] at [employer], he is unable to pull data. Patient given [employer] number to call due Matt believing its a modem issue and needing new SD card. Explained to patient and she verbalized understanding.

## 2023-04-10 DIAGNOSIS — M6281 Muscle weakness (generalized): Secondary | ICD-10-CM | POA: Diagnosis not present

## 2023-04-10 DIAGNOSIS — M25511 Pain in right shoulder: Secondary | ICD-10-CM | POA: Diagnosis not present

## 2023-04-15 DIAGNOSIS — M6281 Muscle weakness (generalized): Secondary | ICD-10-CM | POA: Diagnosis not present

## 2023-04-15 DIAGNOSIS — M25511 Pain in right shoulder: Secondary | ICD-10-CM | POA: Diagnosis not present

## 2023-04-16 ENCOUNTER — Telehealth: Payer: Self-pay

## 2023-04-16 NOTE — Telephone Encounter (Signed)
PT AWARE AND HAS NO ISSUES W/MASK

## 2023-04-16 NOTE — Telephone Encounter (Signed)
-----   Message from Glean Salvo sent at 04/16/2023  4:06 PM EDT ----- Please try to call. I called and got no answer. Patient has very good CPAP compliance.  AHI is slightly increased at 6.0.  If any significant leaking of the mask can order mask refit.  If continues to experience benefit, no issues with machine or equipment.  Continue with current settings.  Bring CPAP machine next year.  Thanks

## 2023-04-16 NOTE — Addendum Note (Signed)
Addended by: Glean Salvo on: 04/16/2023 04:06 PM   Modules accepted: Orders

## 2023-04-17 ENCOUNTER — Telehealth: Payer: Self-pay

## 2023-04-17 DIAGNOSIS — M25511 Pain in right shoulder: Secondary | ICD-10-CM | POA: Diagnosis not present

## 2023-04-17 DIAGNOSIS — M6281 Muscle weakness (generalized): Secondary | ICD-10-CM | POA: Diagnosis not present

## 2023-04-17 NOTE — Telephone Encounter (Signed)
Community message sent to [place] at [facility].

## 2023-04-22 DIAGNOSIS — M6281 Muscle weakness (generalized): Secondary | ICD-10-CM | POA: Diagnosis not present

## 2023-04-22 DIAGNOSIS — M25511 Pain in right shoulder: Secondary | ICD-10-CM | POA: Diagnosis not present

## 2023-04-24 DIAGNOSIS — M25511 Pain in right shoulder: Secondary | ICD-10-CM | POA: Diagnosis not present

## 2023-04-24 DIAGNOSIS — M6281 Muscle weakness (generalized): Secondary | ICD-10-CM | POA: Diagnosis not present

## 2023-04-29 DIAGNOSIS — M25511 Pain in right shoulder: Secondary | ICD-10-CM | POA: Diagnosis not present

## 2023-04-29 DIAGNOSIS — M6281 Muscle weakness (generalized): Secondary | ICD-10-CM | POA: Diagnosis not present

## 2023-04-30 DIAGNOSIS — Z23 Encounter for immunization: Secondary | ICD-10-CM | POA: Diagnosis not present

## 2023-05-01 DIAGNOSIS — M6281 Muscle weakness (generalized): Secondary | ICD-10-CM | POA: Diagnosis not present

## 2023-05-01 DIAGNOSIS — M25511 Pain in right shoulder: Secondary | ICD-10-CM | POA: Diagnosis not present

## 2023-05-06 DIAGNOSIS — M25511 Pain in right shoulder: Secondary | ICD-10-CM | POA: Diagnosis not present

## 2023-05-06 DIAGNOSIS — M6281 Muscle weakness (generalized): Secondary | ICD-10-CM | POA: Diagnosis not present

## 2023-05-09 DIAGNOSIS — M25511 Pain in right shoulder: Secondary | ICD-10-CM | POA: Diagnosis not present

## 2023-05-09 DIAGNOSIS — M6281 Muscle weakness (generalized): Secondary | ICD-10-CM | POA: Diagnosis not present

## 2023-05-13 DIAGNOSIS — M25511 Pain in right shoulder: Secondary | ICD-10-CM | POA: Diagnosis not present

## 2023-05-13 DIAGNOSIS — M6281 Muscle weakness (generalized): Secondary | ICD-10-CM | POA: Diagnosis not present

## 2023-05-15 DIAGNOSIS — M6281 Muscle weakness (generalized): Secondary | ICD-10-CM | POA: Diagnosis not present

## 2023-05-15 DIAGNOSIS — M25511 Pain in right shoulder: Secondary | ICD-10-CM | POA: Diagnosis not present

## 2023-05-20 DIAGNOSIS — M6281 Muscle weakness (generalized): Secondary | ICD-10-CM | POA: Diagnosis not present

## 2023-05-20 DIAGNOSIS — M25511 Pain in right shoulder: Secondary | ICD-10-CM | POA: Diagnosis not present

## 2023-05-24 DIAGNOSIS — Z96611 Presence of right artificial shoulder joint: Secondary | ICD-10-CM | POA: Diagnosis not present

## 2023-05-24 DIAGNOSIS — M12811 Other specific arthropathies, not elsewhere classified, right shoulder: Secondary | ICD-10-CM | POA: Diagnosis not present

## 2023-05-24 DIAGNOSIS — M75101 Unspecified rotator cuff tear or rupture of right shoulder, not specified as traumatic: Secondary | ICD-10-CM | POA: Diagnosis not present

## 2023-06-17 DIAGNOSIS — L821 Other seborrheic keratosis: Secondary | ICD-10-CM | POA: Diagnosis not present

## 2023-06-17 DIAGNOSIS — L814 Other melanin hyperpigmentation: Secondary | ICD-10-CM | POA: Diagnosis not present

## 2023-06-17 DIAGNOSIS — L570 Actinic keratosis: Secondary | ICD-10-CM | POA: Diagnosis not present

## 2023-06-17 DIAGNOSIS — D225 Melanocytic nevi of trunk: Secondary | ICD-10-CM | POA: Diagnosis not present

## 2023-06-17 DIAGNOSIS — L853 Xerosis cutis: Secondary | ICD-10-CM | POA: Diagnosis not present

## 2023-06-17 DIAGNOSIS — L820 Inflamed seborrheic keratosis: Secondary | ICD-10-CM | POA: Diagnosis not present

## 2023-08-15 DIAGNOSIS — G5603 Carpal tunnel syndrome, bilateral upper limbs: Secondary | ICD-10-CM | POA: Diagnosis not present

## 2023-08-26 DIAGNOSIS — H524 Presbyopia: Secondary | ICD-10-CM | POA: Diagnosis not present

## 2023-08-26 DIAGNOSIS — H18513 Endothelial corneal dystrophy, bilateral: Secondary | ICD-10-CM | POA: Diagnosis not present

## 2023-08-28 DIAGNOSIS — E669 Obesity, unspecified: Secondary | ICD-10-CM | POA: Diagnosis not present

## 2023-08-28 DIAGNOSIS — G4733 Obstructive sleep apnea (adult) (pediatric): Secondary | ICD-10-CM | POA: Diagnosis not present

## 2023-08-28 DIAGNOSIS — Z79899 Other long term (current) drug therapy: Secondary | ICD-10-CM | POA: Diagnosis not present

## 2023-08-28 DIAGNOSIS — E785 Hyperlipidemia, unspecified: Secondary | ICD-10-CM | POA: Diagnosis not present

## 2023-08-28 DIAGNOSIS — M1990 Unspecified osteoarthritis, unspecified site: Secondary | ICD-10-CM | POA: Diagnosis not present

## 2023-08-28 DIAGNOSIS — G5601 Carpal tunnel syndrome, right upper limb: Secondary | ICD-10-CM | POA: Diagnosis not present

## 2023-08-28 DIAGNOSIS — Z87891 Personal history of nicotine dependence: Secondary | ICD-10-CM | POA: Diagnosis not present

## 2023-08-28 DIAGNOSIS — I10 Essential (primary) hypertension: Secondary | ICD-10-CM | POA: Diagnosis not present

## 2023-08-28 DIAGNOSIS — Z981 Arthrodesis status: Secondary | ICD-10-CM | POA: Diagnosis not present

## 2023-08-28 DIAGNOSIS — Z7982 Long term (current) use of aspirin: Secondary | ICD-10-CM | POA: Diagnosis not present

## 2023-08-28 DIAGNOSIS — K219 Gastro-esophageal reflux disease without esophagitis: Secondary | ICD-10-CM | POA: Diagnosis not present

## 2023-08-28 DIAGNOSIS — F419 Anxiety disorder, unspecified: Secondary | ICD-10-CM | POA: Diagnosis not present

## 2023-08-28 DIAGNOSIS — J45909 Unspecified asthma, uncomplicated: Secondary | ICD-10-CM | POA: Diagnosis not present

## 2023-08-28 DIAGNOSIS — G629 Polyneuropathy, unspecified: Secondary | ICD-10-CM | POA: Diagnosis not present

## 2023-08-28 DIAGNOSIS — Z8673 Personal history of transient ischemic attack (TIA), and cerebral infarction without residual deficits: Secondary | ICD-10-CM | POA: Diagnosis not present

## 2023-08-28 DIAGNOSIS — Z6835 Body mass index (BMI) 35.0-35.9, adult: Secondary | ICD-10-CM | POA: Diagnosis not present

## 2023-09-06 DIAGNOSIS — J329 Chronic sinusitis, unspecified: Secondary | ICD-10-CM | POA: Diagnosis not present

## 2023-09-06 DIAGNOSIS — J40 Bronchitis, not specified as acute or chronic: Secondary | ICD-10-CM | POA: Diagnosis not present

## 2023-09-13 DIAGNOSIS — J180 Bronchopneumonia, unspecified organism: Secondary | ICD-10-CM | POA: Diagnosis not present

## 2023-09-16 DIAGNOSIS — Z803 Family history of malignant neoplasm of breast: Secondary | ICD-10-CM | POA: Diagnosis not present

## 2023-09-16 DIAGNOSIS — Z1231 Encounter for screening mammogram for malignant neoplasm of breast: Secondary | ICD-10-CM | POA: Diagnosis not present

## 2023-09-16 LAB — HM MAMMOGRAPHY

## 2023-09-19 ENCOUNTER — Other Ambulatory Visit: Payer: Self-pay | Admitting: Oncology

## 2023-09-19 DIAGNOSIS — R920 Mammographic microcalcification found on diagnostic imaging of breast: Secondary | ICD-10-CM

## 2023-09-24 ENCOUNTER — Ambulatory Visit: Payer: Medicare Other | Admitting: Oncology

## 2023-09-24 ENCOUNTER — Encounter: Payer: Self-pay | Admitting: Oncology

## 2023-09-24 ENCOUNTER — Inpatient Hospital Stay: Payer: Medicare Other

## 2023-09-24 ENCOUNTER — Inpatient Hospital Stay: Payer: Medicare Other | Admitting: Hematology and Oncology

## 2023-09-25 DIAGNOSIS — R0602 Shortness of breath: Secondary | ICD-10-CM | POA: Diagnosis not present

## 2023-09-25 DIAGNOSIS — Z6835 Body mass index (BMI) 35.0-35.9, adult: Secondary | ICD-10-CM | POA: Diagnosis not present

## 2023-10-04 ENCOUNTER — Telehealth: Payer: Self-pay | Admitting: Cardiology

## 2023-10-04 DIAGNOSIS — Z6835 Body mass index (BMI) 35.0-35.9, adult: Secondary | ICD-10-CM | POA: Diagnosis not present

## 2023-10-04 DIAGNOSIS — I10 Essential (primary) hypertension: Secondary | ICD-10-CM | POA: Diagnosis not present

## 2023-10-04 DIAGNOSIS — E7800 Pure hypercholesterolemia, unspecified: Secondary | ICD-10-CM | POA: Diagnosis not present

## 2023-10-04 DIAGNOSIS — Z952 Presence of prosthetic heart valve: Secondary | ICD-10-CM

## 2023-10-04 DIAGNOSIS — G4730 Sleep apnea, unspecified: Secondary | ICD-10-CM | POA: Diagnosis not present

## 2023-10-04 LAB — LIPID PANEL
Cholesterol: 160 (ref 0–200)
HDL: 42 (ref 35–70)
LDL Cholesterol: 89
LDl/HDL Ratio: 2
Triglycerides: 147 (ref 40–160)

## 2023-10-04 LAB — CBC AND DIFFERENTIAL
EGFR (Non-African Amer.): 57
HCT: 37 (ref 36–46)
Hemoglobin: 12.8 (ref 12.0–16.0)
Neutrophils Absolute: 3.1
Platelets: 275 10*3/uL (ref 150–400)
TSH: 1.48 (ref 0.41–5.90)
WBC: 5.8

## 2023-10-04 LAB — BASIC METABOLIC PANEL
BUN: 17 (ref 4–21)
CO2: 28 — AB (ref 13–22)
Chloride: 101 (ref 99–108)
Creatinine: 1 (ref 0.5–1.1)
Glucose: 95
Glucose: 95
Potassium: 4.5 meq/L (ref 3.5–5.1)
Sodium: 139 (ref 137–147)
Sodium: 139 (ref 137–147)

## 2023-10-04 LAB — HEPATIC FUNCTION PANEL
ALT: 38 U/L — AB (ref 7–35)
AST: 31 (ref 13–35)
Alkaline Phosphatase: 76 (ref 25–125)
Bilirubin, Total: 0.5

## 2023-10-04 LAB — TSH: TSH: 1.48 (ref 0.41–5.90)

## 2023-10-04 LAB — CBC: RBC: 3.88 (ref 3.87–5.11)

## 2023-10-04 LAB — COMPREHENSIVE METABOLIC PANEL
Albumin: 4.2 (ref 3.5–5.0)
Calcium: 9.1 (ref 8.7–10.7)

## 2023-10-04 NOTE — Telephone Encounter (Signed)
 Patient stated she wants to get orders for an echocardiogram to be done prior to her annual visit in June.

## 2023-10-04 NOTE — Telephone Encounter (Signed)
 Pt requesting to see if she needs to have an echo done before her f/u in may

## 2023-10-05 ENCOUNTER — Other Ambulatory Visit: Payer: Self-pay | Admitting: Cardiology

## 2023-10-07 ENCOUNTER — Other Ambulatory Visit: Payer: Self-pay | Admitting: Oncology

## 2023-10-07 ENCOUNTER — Telehealth: Payer: Self-pay

## 2023-10-07 ENCOUNTER — Ambulatory Visit
Admission: RE | Admit: 2023-10-07 | Discharge: 2023-10-07 | Disposition: A | Discharge status: Home / Self Care | Source: Ambulatory Visit | Attending: Oncology | Admitting: Oncology

## 2023-10-07 DIAGNOSIS — R921 Mammographic calcification found on diagnostic imaging of breast: Secondary | ICD-10-CM

## 2023-10-07 DIAGNOSIS — R920 Mammographic microcalcification found on diagnostic imaging of breast: Secondary | ICD-10-CM

## 2023-10-07 NOTE — Telephone Encounter (Signed)
 Rx refill sent to pharmacy.

## 2023-10-07 NOTE — Telephone Encounter (Signed)
 I LVM for pt to call and schedule echo, it's in my WQ so it will be worked according to SOW- no further action necessary at this time  KBL 10/07/23

## 2023-10-07 NOTE — Telephone Encounter (Signed)
 Called patient and made her aware and transferred to scheduling.

## 2023-10-07 NOTE — Telephone Encounter (Signed)
 Patient is scheduled for a biopsy on 10/09/23 and would like to know if we need to R/S our appointment on 10/10/23 until after she gets her results.

## 2023-10-09 ENCOUNTER — Ambulatory Visit
Admission: RE | Admit: 2023-10-09 | Discharge: 2023-10-09 | Disposition: A | Discharge status: Home / Self Care | Source: Ambulatory Visit | Attending: Oncology | Admitting: Oncology

## 2023-10-09 ENCOUNTER — Ambulatory Visit
Admission: RE | Admit: 2023-10-09 | Discharge: 2023-10-09 | Discharge status: Home / Self Care | Source: Ambulatory Visit | Attending: Oncology | Admitting: Oncology

## 2023-10-09 DIAGNOSIS — R921 Mammographic calcification found on diagnostic imaging of breast: Secondary | ICD-10-CM

## 2023-10-09 DIAGNOSIS — N6321 Unspecified lump in the left breast, upper outer quadrant: Secondary | ICD-10-CM | POA: Diagnosis not present

## 2023-10-09 DIAGNOSIS — D0512 Intraductal carcinoma in situ of left breast: Secondary | ICD-10-CM | POA: Diagnosis not present

## 2023-10-09 DIAGNOSIS — R92322 Mammographic fibroglandular density, left breast: Secondary | ICD-10-CM | POA: Diagnosis not present

## 2023-10-09 NOTE — Telephone Encounter (Signed)
 Per Dr. Dulce Sellar:  Dulce Sellar, Iline Oven, MD  You3 days ago    Yes do about 1 week before her visit   You  Baldo Daub, MD5 days ago    Please advise  LM informing the patient of the following. I sent messages to our front desk staff for scheduling. Order on file.

## 2023-10-10 ENCOUNTER — Inpatient Hospital Stay

## 2023-10-10 ENCOUNTER — Inpatient Hospital Stay: Admitting: Oncology

## 2023-10-11 LAB — SURGICAL PATHOLOGY

## 2023-10-11 NOTE — Progress Notes (Unsigned)
 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 Surgical referral was needed. Pt scheduled with Dr. Lequita Halt for 10/15/23 at 1:45. Phone contact with pt giving time and location.

## 2023-10-15 ENCOUNTER — Telehealth: Payer: Self-pay | Admitting: *Deleted

## 2023-10-15 DIAGNOSIS — N6092 Unspecified benign mammary dysplasia of left breast: Secondary | ICD-10-CM | POA: Diagnosis not present

## 2023-10-15 NOTE — Telephone Encounter (Signed)
   Pre-operative Risk Assessment    Patient Name: Judith Solis  DOB: 1946/03/25 MRN: [id number]   Date of last office visit:12/25/22 DR. MUNLEY Date of next office visit: 12/20/23 DR. MUNLEY   Request for Surgical Clearance    Procedure:   EXCISION OF LEFT BREAST CALCIFICATION WITH PREOP  NEEDLE LOCALIZATION  Date of Surgery:  Clearance 10/31/23                                Surgeon:  DR. Lequita Halt Surgeon's Group or Practice Name:  [facility] Phone number:  [phone] Fax number:  [phone]   Type of Clearance Requested:   - Medical  - Pharmacy:  Hold Aspirin     Type of Anesthesia:  General    Additional requests/questions:    Elpidio Anis   10/15/2023, 5:56 PM

## 2023-10-16 ENCOUNTER — Telehealth: Payer: Self-pay | Admitting: *Deleted

## 2023-10-16 NOTE — Telephone Encounter (Signed)
  Patient Consent for Virtual Visit         Judith Solis has provided verbal consent on 10/16/2023 for a virtual visit (video or telephone).   CONSENT FOR VIRTUAL VISIT FOR:  Judith Solis  By participating in this virtual visit I agree to the following:  I hereby voluntarily request, consent and authorize [facility] and its employed or contracted physicians, physician assistants, nurse practitioners or other licensed health care professionals (the Practitioner), to provide me with telemedicine health care services (the "Services") as deemed necessary by the treating Practitioner. I acknowledge and consent to receive the Services by the Practitioner via telemedicine. I understand that the telemedicine visit will involve communicating with the Practitioner through live audiovisual communication technology and the disclosure of certain medical information by electronic transmission. I acknowledge that I have been given the opportunity to request an in-person assessment or other available alternative prior to the telemedicine visit and am voluntarily participating in the telemedicine visit.  I understand that I have the right to withhold or withdraw my consent to the use of telemedicine in the course of my care at any time, without affecting my right to future care or treatment, and that the Practitioner or I may terminate the telemedicine visit at any time. I understand that I have the right to inspect all information obtained and/or recorded in the course of the telemedicine visit and may receive copies of available information for a reasonable fee.  I understand that some of the potential risks of receiving the Services via telemedicine include:  Delay or interruption in medical evaluation due to technological equipment failure or disruption; Information transmitted may not be sufficient (e.g. poor resolution of images) to allow for appropriate medical decision making by the Practitioner;  and/or  In rare instances, security protocols could fail, causing a breach of personal health information.  Furthermore, I acknowledge that it is my responsibility to provide information about my medical history, conditions and care that is complete and accurate to the best of my ability. I acknowledge that Practitioner's advice, recommendations, and/or decision may be based on factors not within their control, such as incomplete or inaccurate data provided by me or distortions of diagnostic images or specimens that may result from electronic transmissions. I understand that the practice of medicine is not an exact science and that Practitioner makes no warranties or guarantees regarding treatment outcomes. I acknowledge that a copy of this consent can be made available to me via my patient portal [facility] MyChart), or I can request a printed copy by calling the office of [facility].    I understand that my insurance will be billed for this visit.   I have read or had this consent read to me. I understand the contents of this consent, which adequately explains the benefits and risks of the Services being provided via telemedicine.  I have been provided ample opportunity to ask questions regarding this consent and the Services and have had my questions answered to my satisfaction. I give my informed consent for the services to be provided through the use of telemedicine in my medical care

## 2023-10-16 NOTE — Telephone Encounter (Signed)
 Primary Cardiologist:Brian Dulce Sellar, MD   Preoperative team, please contact this patient and set up a phone call appointment for further preoperative risk assessment. Please obtain consent and complete medication review. Thank you for your help.   Ideally aspirin should be continued without interruption, however if the bleeding risk is too great, aspirin may be held for 5-7 days prior to surgery. Please resume aspirin post operatively when it is felt to be safe from a bleeding standpoint.    I also confirmed the patient resides in the state of [place]. As per [facility] Medical Board telemedicine laws, the patient must reside in the state in which the provider is licensed.    Levi Aland, NP-C  10/16/2023, 8:14 AM [address], [address] Office [phone] Fax [phone]

## 2023-10-18 ENCOUNTER — Ambulatory Visit: Attending: General Practice

## 2023-10-18 DIAGNOSIS — Z01810 Encounter for preprocedural cardiovascular examination: Secondary | ICD-10-CM

## 2023-10-18 NOTE — Progress Notes (Signed)
 Virtual Visit via Telephone Note   Because of [name] co-morbid illnesses, she is at least at moderate risk for complications without adequate follow up.  This format is felt to be most appropriate for this patient at this time.  Due to technical limitations with video connection (technology), today's appointment will be conducted as an audio only telehealth visit, and Judith Solis verbally agreed to proceed in this manner.   All issues noted in this document were discussed and addressed.  No physical exam could be performed with this format.  Evaluation Performed:  Preoperative cardiovascular risk assessment _____________   Date:  10/18/2023   Patient ID:  Judith Solis, DOB February 12, 1946, MRN [id number] Patient Location:  Home Provider location:   Office  Primary Care Provider:  Marylen Ponto, MD Primary Cardiologist:  Norman Herrlich, MD  Chief Complaint / Patient Profile   78 y.o. 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  History of Present Illness    Judith Solis is a 78 y.o. female who presents via audio/video conferencing for a telehealth visit today.  Pt was last seen in cardiology clinic on 12/25/22 by Dr. Dulce Sellar.  At that time Judith Solis was doing well.  The patient is now pending procedure as outlined above. Since her last visit, she tells me that she sometimes short of breath with activity but this is nothing new for her.  She has not had any swelling in her feet and her weight has been stable.  She weighs every morning and has been doing so for years.  She wears a CPAP at night so this helps her get some good sleep.  She does exceed 4 METS on the DASI.  She enjoys gardening and did this for a few  hours yesterday  Ideally aspirin should be continued without interruption, however if the bleeding risk is too great, aspirin may be held for 5-7 days prior to surgery. Please resume aspirin post operatively when it is felt to be safe from a bleeding standpoint.   Past Medical History    Past Medical History:  Diagnosis Date   Aortic valve regurgitation    Arthritis    Asthma    Breast cancer (HCC)    a. 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   26 mm Edwards Sapien 3 transcatheter heart valve placed via percutaneous right transfemoral approach    Severe aortic stenosis    Past Surgical History:  Procedure Laterality Date   ABDOMINAL HYSTERECTOMY     BELPHAROPTOSIS REPAIR Bilateral    BREAST BIOPSY Left 10/09/2023   MM LT BREAST BX W LOC DEV EA AD LESION IMG BX SPEC STEREO GUIDE 10/09/2023 [facility]   BREAST BIOPSY Left 10/09/2023   MM LT BREAST BX W LOC DEV 1ST LESION IMAGE BX SPEC STEREO GUIDE 10/09/2023 [facility]   BREAST SURGERY     LUMPECTOMY WITH CHEMO & RADIATION   CATARACT EXTRACTION     EYE SURGERY  INTRAOPERATIVE TRANSTHORACIC ECHOCARDIOGRAM  03/04/2018   Procedure: INTRAOPERATIVE TRANSTHORACIC ECHOCARDIOGRAM;  Surgeon: Tonny Bollman, MD;  Location[facility] OR;  Service: Open Heart Surgery;;   JOINT REPLACEMENT     BILATERAL HIPS 2007 & 2008   RIGHT/LEFT HEART CATH AND CORONARY ANGIOGRAPHY N/A 01/31/2018   Procedure: RIGHT/LEFT HEART CATH AND CORONARY ANGIOGRAPHY;  Surgeon: Kathleene Hazel, MD;  Location: [facility];  Service: Cardiovascular;  Laterality: N/A;   ROTATOR CUFF REPAIR     TONSILLECTOMY     TRANSCATHETER AORTIC VALVE REPLACEMENT, TRANSFEMORAL N/A 03/04/2018   Procedure:  TRANSCATHETER AORTIC VALVE REPLACEMENT, TRANSFEMORAL. EDWARDS SAPIEN 3 TRANSCATHETER HEART VALVE.;  Surgeon: Tonny Bollman, MD;  Location: [facility] OR;  Service: Open Heart Surgery;  Laterality: N/A;    Allergies  Allergies  Allergen Reactions   Entresto [Sacubitril-Valsartan] Itching   Plavix [Clopidogrel Bisulfate] Itching   Codeine Nausea And Vomiting   Oxycodone-Acetaminophen Other (See Comments)    Hallucinations    Home Medications    Prior to Admission medications   Medication Sig Start Date End Date Taking? Authorizing Provider  albuterol (VENTOLIN HFA) 108 (90 Base) MCG/ACT inhaler Inhale 2 puffs into the lungs every 4 (four) hours as needed for wheezing or shortness of breath. 04/18/22   [provider]  aspirin EC 81 MG tablet Take 1 tablet (81 mg total) by mouth daily. Swallow whole. 09/06/20   Baldo Daub, MD  atorvastatin (LIPITOR) 20 MG tablet Take 20 mg by mouth at bedtime. 03/06/15   [provider]  budesonide-formoterol (SYMBICORT) 160-4.5 MCG/ACT inhaler Inhale 2 puffs into the lungs daily as needed (wheezing). 10/15/23   [provider]  CALCIUM CITRATE PO Take 2,000 Units by mouth daily.    [provider]  Cholecalciferol (VITAMIN D3) 50 MCG (2000 UT) TABS Take 1 tablet by mouth daily.    [provider]  citalopram (CELEXA) 20 MG tablet Take 20 mg by mouth daily.    [provider]  clobetasol cream (TEMOVATE) 0.05 % Apply 1 application topically 2 (two) times daily as needed (vaginal irritation).    [provider]  furosemide (LASIX) 20 MG tablet TAKE 1 TABLET BY MOUTH EVERY DAY 10/07/23   Baldo Daub, MD  magnesium gluconate (MAGONATE) 500 MG tablet Take 500 mg by mouth daily.    [provider]  meloxicam (MOBIC) 15 MG tablet Take 15 mg by mouth daily. 12/19/22   [provider]  Multiple Vitamin (MULTIVITAMIN) tablet Take 1 tablet by mouth daily.    [provider]   pantoprazole (PROTONIX) 40 MG tablet Take 40 mg by mouth daily.    [provider]  Probiotic Product (PROBIOTIC-10 PO) Take 1 tablet by mouth every other day.    [provider]  valACYclovir (VALTREX) 1000 MG tablet Take 1,000 mg by mouth daily. 06/20/22   [provider]  valsartan (DIOVAN) 40 MG tablet Take 1 tablet (40 mg total) by mouth 2 (two) times daily. 01/11/23   Baldo Daub, MD    Physical Exam    Vital Signs:  Judith Solis does not have vital signs available for review today.  Given telephonic nature of communication, physical exam is limited. AAOx3. NAD. Normal affect.  Speech and respirations are unlabored.  Accessory Clinical Findings    None  Assessment & Plan    1.  Preoperative Cardiovascular Risk Assessment:  Judith Solis perioperative risk of a major cardiac event is 0.9% according to the Revised Cardiac  Risk Index (RCRI).  Therefore, she is at low risk for perioperative complications.   Her functional capacity is good at 5.62 METs according to the [facility] Activity Status Index (DASI). Recommendations: According to ACC/AHA guidelines, no further cardiovascular testing needed.  The patient may proceed to surgery at acceptable risk.   Antiplatelet and/or Anticoagulation Recommendations: The patient should remain on Aspirin without interruption.     The patient was advised that if she develops new symptoms prior to surgery to contact our office to arrange for a follow-up visit, and she verbalized understanding.   A copy of this note will be routed to requesting surgeon.  Time:   Today, I have spent 6 minutes with the patient with telehealth technology discussing medical history, symptoms, and management plan.     Sharlene Dory, PA-C  10/18/2023, 7:51 AM

## 2023-10-21 ENCOUNTER — Other Ambulatory Visit: Payer: Self-pay | Admitting: Oncology

## 2023-10-21 DIAGNOSIS — N6092 Unspecified benign mammary dysplasia of left breast: Secondary | ICD-10-CM

## 2023-10-21 DIAGNOSIS — Z170 Estrogen receptor positive status [ER+]: Secondary | ICD-10-CM

## 2023-10-21 HISTORY — DX: Unspecified benign mammary dysplasia of left breast: N60.92

## 2023-10-22 ENCOUNTER — Inpatient Hospital Stay (HOSPITAL_BASED_OUTPATIENT_CLINIC_OR_DEPARTMENT_OTHER): Admitting: Oncology

## 2023-10-22 ENCOUNTER — Inpatient Hospital Stay: Attending: Oncology

## 2023-10-22 ENCOUNTER — Encounter: Payer: Self-pay | Admitting: Oncology

## 2023-10-22 VITALS — BP 140/83 | HR 84 | Temp 98.2°F | Resp 16 | Ht 65.0 in | Wt 215.1 lb

## 2023-10-22 DIAGNOSIS — Z853 Personal history of malignant neoplasm of breast: Secondary | ICD-10-CM | POA: Diagnosis not present

## 2023-10-22 DIAGNOSIS — Z170 Estrogen receptor positive status [ER+]: Secondary | ICD-10-CM

## 2023-10-22 DIAGNOSIS — Z9221 Personal history of antineoplastic chemotherapy: Secondary | ICD-10-CM | POA: Insufficient documentation

## 2023-10-22 DIAGNOSIS — R7989 Other specified abnormal findings of blood chemistry: Secondary | ICD-10-CM | POA: Diagnosis not present

## 2023-10-22 DIAGNOSIS — Z923 Personal history of irradiation: Secondary | ICD-10-CM | POA: Diagnosis not present

## 2023-10-22 DIAGNOSIS — C50312 Malignant neoplasm of lower-inner quadrant of left female breast: Secondary | ICD-10-CM

## 2023-10-22 DIAGNOSIS — N6092 Unspecified benign mammary dysplasia of left breast: Secondary | ICD-10-CM | POA: Diagnosis not present

## 2023-10-22 LAB — CBC WITH DIFFERENTIAL (CANCER CENTER ONLY)
Abs Immature Granulocytes: 0.03 10*3/uL (ref 0.00–0.07)
Basophils Absolute: 0.1 10*3/uL (ref 0.0–0.1)
Basophils Relative: 1 %
Eosinophils Absolute: 0.2 10*3/uL (ref 0.0–0.5)
Eosinophils Relative: 2 %
HCT: 40 % (ref 36.0–46.0)
Hemoglobin: 13.7 g/dL (ref 12.0–15.0)
Immature Granulocytes: 0 %
Lymphocytes Relative: 31 %
Lymphs Abs: 2.7 10*3/uL (ref 0.7–4.0)
MCH: 32.6 pg (ref 26.0–34.0)
MCHC: 34.3 g/dL (ref 30.0–36.0)
MCV: 95.2 fL (ref 80.0–100.0)
Monocytes Absolute: 0.8 10*3/uL (ref 0.1–1.0)
Monocytes Relative: 9 %
Neutro Abs: 4.9 10*3/uL (ref 1.7–7.7)
Neutrophils Relative %: 57 %
Platelet Count: 246 10*3/uL (ref 150–400)
RBC: 4.2 MIL/uL (ref 3.87–5.11)
RDW: 12.7 % (ref 11.5–15.5)
WBC Count: 8.7 10*3/uL (ref 4.0–10.5)
nRBC: 0 % (ref 0.0–0.2)
nRBC: 0 /100{WBCs}

## 2023-10-22 LAB — CMP (CANCER CENTER ONLY)
ALT: 45 U/L — ABNORMAL HIGH (ref 0–44)
AST: 39 U/L (ref 15–41)
Albumin: 4.5 g/dL (ref 3.5–5.0)
Alkaline Phosphatase: 85 U/L (ref 38–126)
Anion gap: 11 (ref 5–15)
BUN: 23 mg/dL (ref 8–23)
CO2: 29 mmol/L (ref 22–32)
Calcium: 9.6 mg/dL (ref 8.9–10.3)
Chloride: 101 mmol/L (ref 98–111)
Creatinine: 1.04 mg/dL — ABNORMAL HIGH (ref 0.44–1.00)
GFR, Estimated: 54 mL/min — ABNORMAL LOW (ref >60)
Glucose, Bld: 90 mg/dL (ref 70–99)
Potassium: 4.1 mmol/L (ref 3.5–5.1)
Sodium: 140 mmol/L (ref 135–145)
Total Bilirubin: 0.4 mg/dL (ref 0.0–1.2)
Total Protein: 7.3 g/dL (ref 6.5–8.1)

## 2023-10-22 NOTE — Progress Notes (Signed)
 [facility]  [address] [place],  [place]  [address] [phone]  Clinic Day:  10/22/23  Referring physician: Marylen Ponto, MD  CHIEF COMPLAINT:  CC: History of stage IA hormone receptor positive left breast cancer, new breast lesion March 2025  Current Treatment:  Refer to surgeon for lumpectomy  HISTORY OF PRESENT ILLNESS:  Judith Solis is a 78 y.o. female who we began seeing in August 2014 for follow-up of breast cancer, when she moved to the area from [place].  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  She was treated with lumpectomy.  Pathology revealed a 1.5 cm, grade 3, invasive ductal carcinoma with a negative nodes.  Estrogen and progesterone receptors were positive and her 2 Neu negative.  Oncotype DX score was 25, which is in an intermediate risk category.  She received adjuvant chemotherapy with Taxotere and Cytoxan for 3 months, followed by adjuvant radiation to the left breast, as well as Arimidex for 5 years completed in March 2015.  She is here for yearly routine follow-up and states she has been doing fairly well.  She denies any changes in her breasts.  Prior to her visit today she did undergo bilateral diagnostic mammogram.  She has had problems with severe bladder/urethral/vaginal pressure with urinary frequency.  She was seen by Dr. Sherron Monday and underwent cystoscopy, which was negative.  He felt her symptoms were most likely due to vaginal dryness.  Her symptoms did not improve with Vesicare.  In 2016, she saw Dr. Wille Glaser for cardiac evaluation and was found to have congestive heart failure, as well as aortic stenosis.  She was placed on losartan/hydrochlorothiazide 50/12.5 daily.  She had an echocardiogram at that time.  She had shoulder surgery in 2015-06-15.  She had osteopenia on bone density in March 2015.  Repeat bone density scan in 2017 revealed persistent osteopenia.  There  was a 6% worsening of her spine, but a 3% improvement of her left forearm.  Her husband passed away in November 23, 2018after a prolonged illness.  She had her aortic valve replaced at [facility] in 2019 with a bovine valve, so she does not require full anticoagulation.  She states that she has had two sisters who had breast cancer, and had to undergo bilateral mastectomies.  She also had a sister who died of lung cancer at age 35.  One of the sisters that had breast cancer had advanced bladder cancer and required total cystectomy.  So far genetic testing has been negative on family members.  INTERVAL HISTORY:  Judith Solis is here for her annual follow-up but now has a new abnormal mammogram. She has a history of stage IA hormone receptor positive left breast cancer in 2014 This was treated with chemotherapy with TC for 3 months, radiation, and Arimidex for 3 years. Patient states that she feels well but complains of hot flashes and arthralgias. She had a screening bilateral mammogram done on 09/16/2023 that revealed a suspicious cluster of microcalcifications in the left breast.  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 Her lumpectomy has already been scheduled on 10/31/2023 to be done by Dr. Lequita Halt. I informed her that she will not need radiation but recommended preventative medication for the recurrence of breast cancer. I recommended an oral hormonal therapy such as Raloxifene and will discuss that further with her  when she returns after we have the final pathology. She has a WBC of 8.7, hemoglobin of 13.7, and platelet count of 246,000. Her CMP is normal other than a elevated creatinine of 1.04 and ALT of 45, a chronic finding. I will see her back in 3-4 weeks for reevaluation of her healing and review of her pathology.  She denies signs of infection such as sore throat, sinus drainage, cough, or urinary symptoms.  She denies  fevers or recurrent chills. She denies pain. She denies nausea, vomiting, chest pain, dyspnea or cough. Her appetite is good and her weight has increased 1 pounds over last 7 months .   REVIEW OF SYSTEMS:  Review of Systems  Constitutional: Negative.  Negative for appetite change, chills, diaphoresis, fatigue, fever and unexpected weight change.  HENT:  Negative.  Negative for hearing loss, lump/mass, mouth sores, nosebleeds, sore throat, tinnitus, trouble swallowing and voice change.   Eyes: Negative.   Respiratory: Negative.  Negative for chest tightness, cough, hemoptysis, shortness of breath and wheezing.   Cardiovascular: Negative.  Negative for chest pain, leg swelling and palpitations.  Gastrointestinal: Negative.  Negative for abdominal distention, abdominal pain, blood in stool, constipation, diarrhea, nausea, rectal pain and vomiting.  Endocrine: Positive for hot flashes.  Genitourinary: Negative.  Negative for bladder incontinence, difficulty urinating, dyspareunia, dysuria, frequency, hematuria, menstrual problem, nocturia, pelvic pain, vaginal bleeding and vaginal discharge.   Musculoskeletal:  Positive for arthralgias (mainly of the bilateral hands). Negative for back pain, flank pain, gait problem, myalgias, neck pain and neck stiffness.       Soreness in the left breast from previous biopsy  Skin: Negative.  Negative for itching, rash and wound.  Neurological: Negative.  Negative for dizziness, extremity weakness, gait problem, headaches, light-headedness, numbness, seizures and speech difficulty.  Hematological: Negative.  Negative for adenopathy. Does not bruise/bleed easily.  Psychiatric/Behavioral: Negative.  Negative for confusion, decreased concentration, depression, sleep disturbance and suicidal ideas. The patient is not nervous/anxious.      VITALS:  Blood pressure (!) 140/83, pulse 84, temperature 98.2 F (36.8 C), temperature source Oral, resp. rate 16, height 5\' 5"   (1.651 m), weight 215 lb 1.6 oz (97.6 kg), SpO2 97%.  Wt Readings from Last 3 Encounters:  10/22/23 215 lb 1.6 oz (97.6 kg)  04/02/23 214 lb (97.1 kg)  12/25/22 212 lb (96.2 kg)    Body mass index is 35.79 kg/m.  Performance status (ECOG): 1 - Symptomatic but completely ambulatory  PHYSICAL EXAM:  Physical Exam Vitals and nursing note reviewed.  Constitutional:      General: She is not in acute distress.    Appearance: Normal appearance. She is normal weight. She is not ill-appearing, toxic-appearing or diaphoretic.  HENT:     Head: Normocephalic and atraumatic.     Right Ear: Tympanic membrane, ear canal and external ear normal. There is no impacted cerumen.     Left Ear: Tympanic membrane, ear canal and external ear normal. There is no impacted cerumen.     Nose: Nose normal. No congestion or rhinorrhea.     Mouth/Throat:     Mouth: Mucous membranes are moist.     Pharynx: Oropharynx is clear. No oropharyngeal exudate or posterior oropharyngeal erythema.  Eyes:     General: No scleral icterus.       Right eye: No discharge.        Left eye: No discharge.     Extraocular Movements: Extraocular movements intact.     Conjunctiva/sclera: Conjunctivae  normal.     Pupils: Pupils are equal, round, and reactive to light.  Neck:     Vascular: No carotid bruit.  Cardiovascular:     Rate and Rhythm: Normal rate and regular rhythm.     Pulses: Normal pulses.     Heart sounds: Normal heart sounds. No murmur heard.    No friction rub. No gallop.  Pulmonary:     Effort: Pulmonary effort is normal. No respiratory distress.     Breath sounds: Normal breath sounds. No stridor. No wheezing, rhonchi or rales.  Chest:     Chest wall: No tenderness.  Breasts:    Right: Normal.     Left: Normal.     Comments: Scar in the skin of the mid upper sternum Well healed scar in the lower inner quadrant of the left breast Small 1.5cm nodule in the lateral left breast in the upper outer quadrant  with resolving ecchymosis and another one 3-4cm across tender and nodular, more lateral near the axilla Abdominal:     General: Bowel sounds are normal. There is no distension.     Palpations: Abdomen is soft. There is no hepatomegaly, splenomegaly or mass.     Tenderness: There is no abdominal tenderness. There is no right CVA tenderness, left CVA tenderness, guarding or rebound.     Hernia: No hernia is present.  Musculoskeletal:        General: No swelling, tenderness, deformity or signs of injury. Normal range of motion.     Cervical back: Normal range of motion and neck supple. No rigidity or tenderness.     Right lower leg: No edema.     Left lower leg: No edema.  Lymphadenopathy:     Cervical: No cervical adenopathy.     Right cervical: No superficial, deep or posterior cervical adenopathy.    Left cervical: No superficial, deep or posterior cervical adenopathy.     Upper Body:     Right upper body: No supraclavicular, axillary or pectoral adenopathy.     Left upper body: No supraclavicular, axillary or pectoral adenopathy.  Skin:    General: Skin is warm and dry.     Coloration: Skin is not jaundiced or pale.     Findings: No bruising, erythema, lesion or rash.  Neurological:     General: No focal deficit present.     Mental Status: She is alert and oriented to person, place, and time. Mental status is at baseline.     Cranial Nerves: No cranial nerve deficit.     Sensory: No sensory deficit.     Motor: No weakness.     Coordination: Coordination normal.     Gait: Gait normal.     Deep Tendon Reflexes: Reflexes normal.  Psychiatric:        Mood and Affect: Mood normal.        Behavior: Behavior normal.        Thought Content: Thought content normal.        Judgment: Judgment normal.     LABS:      Latest Ref Rng & Units 10/22/2023    3:58 PM 10/04/2023   12:00 AM 10/11/2022    3:45 PM  CBC  WBC 4.0 - 10.5 K/uL 8.7  5.8     7.6   Hemoglobin 12.0 - 15.0 g/dL 16.1   09.6     04.5   Hematocrit 36.0 - 46.0 % 40.0  37     41.6   Platelets 150 -  400 K/uL 246  275     263      This result is from an external source.      Latest Ref Rng & Units 10/22/2023    3:58 PM 10/04/2023   12:00 AM 10/11/2022    3:45 PM  CMP  Glucose 70 - 99 mg/dL 90   161   BUN 8 - 23 mg/dL 23  17     20    Creatinine 0.44 - 1.00 mg/dL 0.96  1.0     0.45   Sodium 135 - 145 mmol/L 140  139       139     134   Potassium 3.5 - 5.1 mmol/L 4.1  4.5     3.9   Chloride 98 - 111 mmol/L 101  101     99   CO2 22 - 32 mmol/L 29  28     28    Calcium 8.9 - 10.3 mg/dL 9.6  9.1     9.1   Total Protein 6.5 - 8.1 g/dL 7.3   7.5   Total Bilirubin 0.0 - 1.2 mg/dL 0.4   0.7   Alkaline Phos 38 - 126 U/L 85  76     72   AST 15 - 41 U/L 39  31     38   ALT 0 - 44 U/L 45  38     61      This result is from an external source.    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          - CALCIFICATIONS ARE PRESENT           - NO MALIGNANCY IDENTIFIED     Allergies:  Allergies  Allergen Reactions   Entresto [Sacubitril-Valsartan] Itching   Plavix [Clopidogrel Bisulfate] Itching   Codeine Nausea And Vomiting   Oxycodone-Acetaminophen Other (See Comments)    Hallucinations    Current Medications: Current Outpatient Medications  Medication Sig Dispense Refill   aspirin EC 81 MG tablet Take 1 tablet (81 mg total) by mouth daily. Swallow whole. 90 tablet 3   atorvastatin (LIPITOR) 20 MG tablet Take 20 mg by mouth at bedtime.     CALCIUM CITRATE PO Take 2,000 Units by mouth daily.     Cholecalciferol (VITAMIN D3) 50 MCG (2000 UT) TABS Take 1 tablet by mouth daily.      citalopram (CELEXA) 20 MG tablet Take 20 mg by mouth daily.     clobetasol cream (TEMOVATE) 0.05 % Apply 1 application topically 2 (two) times daily as needed (vaginal irritation).     estradiol (ESTRACE) 0.1 MG/GM vaginal cream Place 1 Applicatorful vaginally at bedtime.     furosemide (LASIX) 20 MG tablet TAKE 1 TABLET BY MOUTH EVERY DAY 90 tablet 0   meloxicam (MOBIC) 15 MG tablet Take 15 mg by mouth daily.     Multiple Vitamin (MULTIVITAMIN) tablet Take 1 tablet by mouth daily.     pantoprazole (PROTONIX) 40 MG tablet Take 40 mg by mouth daily.     Probiotic Product (PROBIOTIC-10 PO) Take 1 tablet  by mouth every other day.     valACYclovir (VALTREX) 1000 MG tablet Take 1,000 mg by mouth daily.     valsartan (DIOVAN) 40 MG tablet Take 1 tablet (40 mg total) by mouth 2 (two) times daily. 180 tablet 3   No current facility-administered medications for this visit.     ASSESSMENT & PLAN:  Assessment:   1.  Remote history of stage IA hormone receptor positive breast cancer, diagnosed in July 2009.  She remains without evidence of recurrence.    2.  Osteopenia for which she is taking oral calcium and vitamin D. Bone density scans are scheduled through Dr. Leonor Liv.    3.  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 She will have a lumpectomy.  Plan: She had a screening bilateral mammogram done on 09/16/2023 that revealed a suspicious cluster of microcalcifications in the left breast.  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 Her lumpectomy has already been scheduled on 10/31/2023 to be done by Dr. Lequita Halt. I informed her that she will not need radiation but recommended preventative medication for the recurrence of breast cancer. I recommended an oral hormonal therapy such as Raloxifene and will discuss that further with her when she returns after we  have the final pathology. She has a WBC of 8.7, hemoglobin of 13.7, and platelet count of 246,000. Her CMP is normal other than a elevated creatinine of 1.04 and ALT of 45, a chronic finding. I will see her back in 3-4 weeks for reevaluation of her healing and review of her pathology. The patient understands the plans discussed today and is in agreement with them.  She knows to contact our office if she develops concerns regarding her breast cancer.  I provided 20 minutes of face-to-face time during this this encounter and > 50% was spent counseling as documented under my assessment and plan.   Dellia Beckwith, MD [facility] - A DEPT OF [facility] [address] [place] [address] Dept: [phone] Dept: Valinda Hoar: [phone]  No orders of the defined types were placed in this encounter.   I,Jasmine M Lassiter,acting as a scribe for Dellia Beckwith, MD.,have documented all relevant documentation on the behalf of Dellia Beckwith, MD,as directed by  Dellia Beckwith, MD while in the presence of Dellia Beckwith, MD.

## 2023-10-23 ENCOUNTER — Telehealth: Payer: Self-pay | Admitting: Oncology

## 2023-10-23 NOTE — Telephone Encounter (Signed)
 10/23/23 LVM NEXT APPT SCHEDULED ON 11/13/23 AT 3PM.

## 2023-10-31 ENCOUNTER — Other Ambulatory Visit: Payer: Self-pay

## 2023-10-31 DIAGNOSIS — M1990 Unspecified osteoarthritis, unspecified site: Secondary | ICD-10-CM | POA: Diagnosis not present

## 2023-10-31 DIAGNOSIS — Z79899 Other long term (current) drug therapy: Secondary | ICD-10-CM | POA: Diagnosis not present

## 2023-10-31 DIAGNOSIS — C50412 Malignant neoplasm of upper-outer quadrant of left female breast: Secondary | ICD-10-CM | POA: Diagnosis not present

## 2023-10-31 DIAGNOSIS — E669 Obesity, unspecified: Secondary | ICD-10-CM | POA: Diagnosis not present

## 2023-10-31 DIAGNOSIS — C50912 Malignant neoplasm of unspecified site of left female breast: Secondary | ICD-10-CM | POA: Diagnosis not present

## 2023-10-31 DIAGNOSIS — Z952 Presence of prosthetic heart valve: Secondary | ICD-10-CM | POA: Diagnosis not present

## 2023-10-31 DIAGNOSIS — I110 Hypertensive heart disease with heart failure: Secondary | ICD-10-CM | POA: Diagnosis not present

## 2023-10-31 DIAGNOSIS — I10 Essential (primary) hypertension: Secondary | ICD-10-CM | POA: Diagnosis not present

## 2023-10-31 DIAGNOSIS — I5042 Chronic combined systolic (congestive) and diastolic (congestive) heart failure: Secondary | ICD-10-CM | POA: Diagnosis not present

## 2023-10-31 DIAGNOSIS — R011 Cardiac murmur, unspecified: Secondary | ICD-10-CM | POA: Diagnosis not present

## 2023-10-31 DIAGNOSIS — G4733 Obstructive sleep apnea (adult) (pediatric): Secondary | ICD-10-CM | POA: Diagnosis not present

## 2023-10-31 DIAGNOSIS — J45990 Exercise induced bronchospasm: Secondary | ICD-10-CM | POA: Diagnosis not present

## 2023-10-31 DIAGNOSIS — Z853 Personal history of malignant neoplasm of breast: Secondary | ICD-10-CM | POA: Diagnosis not present

## 2023-10-31 DIAGNOSIS — Z8673 Personal history of transient ischemic attack (TIA), and cerebral infarction without residual deficits: Secondary | ICD-10-CM | POA: Diagnosis not present

## 2023-10-31 DIAGNOSIS — K219 Gastro-esophageal reflux disease without esophagitis: Secondary | ICD-10-CM | POA: Diagnosis not present

## 2023-10-31 DIAGNOSIS — R921 Mammographic calcification found on diagnostic imaging of breast: Secondary | ICD-10-CM | POA: Diagnosis not present

## 2023-10-31 DIAGNOSIS — Z6835 Body mass index (BMI) 35.0-35.9, adult: Secondary | ICD-10-CM | POA: Diagnosis not present

## 2023-10-31 DIAGNOSIS — Z7982 Long term (current) use of aspirin: Secondary | ICD-10-CM | POA: Diagnosis not present

## 2023-10-31 DIAGNOSIS — E7800 Pure hypercholesterolemia, unspecified: Secondary | ICD-10-CM | POA: Diagnosis not present

## 2023-10-31 DIAGNOSIS — N3281 Overactive bladder: Secondary | ICD-10-CM | POA: Diagnosis not present

## 2023-10-31 DIAGNOSIS — F419 Anxiety disorder, unspecified: Secondary | ICD-10-CM | POA: Diagnosis not present

## 2023-10-31 DIAGNOSIS — R920 Mammographic microcalcification found on diagnostic imaging of breast: Secondary | ICD-10-CM | POA: Diagnosis not present

## 2023-10-31 NOTE — Progress Notes (Signed)
 This information is for the discussion at Tumor Board and not part of the official treatment plan.

## 2023-11-04 ENCOUNTER — Other Ambulatory Visit

## 2023-11-13 ENCOUNTER — Other Ambulatory Visit: Payer: Self-pay | Admitting: Oncology

## 2023-11-13 ENCOUNTER — Inpatient Hospital Stay

## 2023-11-13 ENCOUNTER — Telehealth: Payer: Self-pay | Admitting: Oncology

## 2023-11-13 ENCOUNTER — Inpatient Hospital Stay: Admitting: Oncology

## 2023-11-13 DIAGNOSIS — C50312 Malignant neoplasm of lower-inner quadrant of left female breast: Secondary | ICD-10-CM

## 2023-11-13 DIAGNOSIS — Z170 Estrogen receptor positive status [ER+]: Secondary | ICD-10-CM

## 2023-11-13 NOTE — Telephone Encounter (Signed)
 11/13/23 Spoke with patient and rescheduled appt.

## 2023-11-13 NOTE — Progress Notes (Incomplete)
 [facility]  [address] [place],  [place]  [address] [phone]  Clinic Day:  11/13/23   Referring physician: Gaither Juba, MD  CHIEF COMPLAINT:  CC: History of stage IA hormone receptor positive left breast cancer, new breast lesion March 2025  Current Treatment:  Refer to surgeon for lumpectomy  HISTORY OF PRESENT ILLNESS:  Judith Solis is a 78 y.o. female who we began seeing in August 2014 for follow-up of breast cancer, when she moved to the area from [place] .  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  She was treated with lumpectomy.  Pathology revealed a 1.5 cm, grade 3, invasive ductal carcinoma with a negative nodes.  Estrogen and progesterone receptors were positive and her 2 Neu negative.  Oncotype DX score was 25, which is in an intermediate risk category.  She received adjuvant chemotherapy with Taxotere and Cytoxan for 3 months, followed by adjuvant radiation to the left breast, as well as Arimidex for 5 years completed in March 2015.  She is here for yearly routine follow-up and states she has been doing fairly well.  She denies any changes in her breasts.  Prior to her visit today she did undergo bilateral diagnostic mammogram.  She has had problems with severe bladder/urethral/vaginal pressure with urinary frequency.  She was seen by Dr. Clarke Crouch and underwent cystoscopy, which was negative.  He felt her symptoms were most likely due to vaginal dryness.  Her symptoms did not improve with Vesicare.  In 2016, she saw Dr. Stephen Ehrlich for cardiac evaluation and was found to have congestive heart failure, as well as aortic stenosis.  She was placed on losartan /hydrochlorothiazide  50/12.5 daily.  She had an echocardiogram at that time.  She had shoulder surgery in 07-02-2015.  She had osteopenia on bone density in March 2015.  Repeat bone density scan in 2017 revealed persistent osteopenia.   There was a 6% worsening of her spine, but a 3% improvement of her left forearm.  Her husband passed away in Dec 10, 2018after a prolonged illness.  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  She states that she has had two sisters who had breast cancer, and had to undergo bilateral mastectomies.  She also had a sister who died of lung cancer at age 72.  One of the sisters that had breast cancer had advanced bladder cancer and required total cystectomy.  So far genetic testing has been negative on family members.  INTERVAL HISTORY:  Judith Solis is here for her annual follow-up but now has a new abnormal mammogram. She has a history of stage IA hormone receptor positive left breast cancer in 2014 This was treated with chemotherapy with TC for 3 months, radiation, and Arimidex for 3 years. She had a screening bilateral mammogram done on 09/16/2023 that revealed a suspicious cluster of microcalcifications in the left breast.  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 She had a lumpectomy done on 10/31/2023 by Dr. Britta Candy. ***  Patient states that she feels *** and ***.      She denies signs of infection such as sore throat, sinus drainage, cough, or urinary symptoms.  She denies fevers or recurrent chills. She denies pain.  She denies nausea, vomiting, chest pain, dyspnea or cough. Her appetite is *** and her weight {Weight change:10426}.  Patient states that she feels well but complains of hot flashes and arthralgias.  I informed her that she will not need radiation but recommended preventative medication for the recurrence of breast cancer. I recommended an oral hormonal therapy such as Raloxifene and will discuss that further with her when she returns after we have  the final pathology. She has a WBC of 8.7, hemoglobin of 13.7, and platelet count of 246,000. Her CMP is normal other than a elevated creatinine of 1.04 and ALT of 45, a chronic finding. I will see her back in 3-4 weeks for reevaluation of her healing and review of her pathology.  She denies signs of infection such as sore throat, sinus drainage, cough, or urinary symptoms.  She denies fevers or recurrent chills. She denies pain. She denies nausea, vomiting, chest pain, dyspnea or cough. Her appetite is good and her weight has increased 1 pounds over last 7 months .   REVIEW OF SYSTEMS:  Review of Systems  Constitutional: Negative.  Negative for appetite change, chills, diaphoresis, fatigue, fever and unexpected weight change.  HENT:  Negative.  Negative for hearing loss, lump/mass, mouth sores, nosebleeds, sore throat, tinnitus, trouble swallowing and voice change.   Eyes: Negative.   Respiratory: Negative.  Negative for chest tightness, cough, hemoptysis, shortness of breath and wheezing.   Cardiovascular: Negative.  Negative for chest pain, leg swelling and palpitations.  Gastrointestinal: Negative.  Negative for abdominal distention, abdominal pain, blood in stool, constipation, diarrhea, nausea, rectal pain and vomiting.  Endocrine: Positive for hot flashes.  Genitourinary: Negative.  Negative for bladder incontinence, difficulty urinating, dyspareunia, dysuria, frequency, hematuria, menstrual problem, nocturia, pelvic pain, vaginal bleeding and vaginal discharge.   Musculoskeletal:  Positive for arthralgias (mainly of the bilateral hands). Negative for back pain, flank pain, gait problem, myalgias, neck pain and neck stiffness.       Soreness in the left breast from previous biopsy  Skin: Negative.  Negative for itching, rash and wound.  Neurological: Negative.  Negative for dizziness, extremity weakness, gait problem, headaches, light-headedness, numbness, seizures and speech difficulty.   Hematological: Negative.  Negative for adenopathy. Does not bruise/bleed easily.  Psychiatric/Behavioral: Negative.  Negative for confusion, decreased concentration, depression, sleep disturbance and suicidal ideas. The patient is not nervous/anxious.      VITALS:  There were no vitals taken for this visit.  Wt Readings from Last 3 Encounters:  10/22/23 215 lb 1.6 oz (97.6 kg)  04/02/23 214 lb (97.1 kg)  12/25/22 212 lb (96.2 kg)    There is no height or weight on file to calculate BMI.  Performance status (ECOG): 1 - Symptomatic but completely ambulatory  PHYSICAL EXAM:  Physical Exam Vitals and nursing note reviewed.  Constitutional:      General: She is not in acute distress.    Appearance: Normal appearance. She is normal weight. She is not ill-appearing, toxic-appearing or diaphoretic.  HENT:     Head: Normocephalic and atraumatic.     Right Ear: Tympanic membrane, ear canal and external ear normal. There is no impacted cerumen.     Left Ear: Tympanic membrane, ear canal and external ear normal. There is no impacted cerumen.     Nose: Nose normal. No congestion or rhinorrhea.     Mouth/Throat:     Mouth: Mucous membranes are moist.     Pharynx: Oropharynx is clear. No oropharyngeal exudate  or posterior oropharyngeal erythema.  Eyes:     General: No scleral icterus.       Right eye: No discharge.        Left eye: No discharge.     Extraocular Movements: Extraocular movements intact.     Conjunctiva/sclera: Conjunctivae normal.     Pupils: Pupils are equal, round, and reactive to light.  Neck:     Vascular: No carotid bruit.  Cardiovascular:     Rate and Rhythm: Normal rate and regular rhythm.     Pulses: Normal pulses.     Heart sounds: Normal heart sounds. No murmur heard.    No friction rub. No gallop.  Pulmonary:     Effort: Pulmonary effort is normal. No respiratory distress.     Breath sounds: Normal breath sounds. No stridor. No wheezing, rhonchi or rales.   Chest:     Chest wall: No tenderness.  Breasts:    Right: Normal.     Left: Normal.     Comments: Scar in the skin of the mid upper sternum Well healed scar in the lower inner quadrant of the left breast Small 1.5cm nodule in the lateral left breast in the upper outer quadrant with resolving ecchymosis and another one 3-4cm across tender and nodular, more lateral near the axilla Abdominal:     General: Bowel sounds are normal. There is no distension.     Palpations: Abdomen is soft. There is no hepatomegaly, splenomegaly or mass.     Tenderness: There is no abdominal tenderness. There is no right CVA tenderness, left CVA tenderness, guarding or rebound.     Hernia: No hernia is present.  Musculoskeletal:        General: No swelling, tenderness, deformity or signs of injury. Normal range of motion.     Cervical back: Normal range of motion and neck supple. No rigidity or tenderness.     Right lower leg: No edema.     Left lower leg: No edema.  Lymphadenopathy:     Cervical: No cervical adenopathy.     Right cervical: No superficial, deep or posterior cervical adenopathy.    Left cervical: No superficial, deep or posterior cervical adenopathy.     Upper Body:     Right upper body: No supraclavicular, axillary or pectoral adenopathy.     Left upper body: No supraclavicular, axillary or pectoral adenopathy.  Skin:    General: Skin is warm and dry.     Coloration: Skin is not jaundiced or pale.     Findings: No bruising, erythema, lesion or rash.  Neurological:     General: No focal deficit present.     Mental Status: She is alert and oriented to person, place, and time. Mental status is at baseline.     Cranial Nerves: No cranial nerve deficit.     Sensory: No sensory deficit.     Motor: No weakness.     Coordination: Coordination normal.     Gait: Gait normal.     Deep Tendon Reflexes: Reflexes normal.  Psychiatric:        Mood and Affect: Mood normal.        Behavior: Behavior  normal.        Thought Content: Thought content normal.        Judgment: Judgment normal.    LABS:      Latest Ref Rng & Units 10/22/2023    3:58 PM 10/04/2023   12:00 AM 10/11/2022    3:45 PM  CBC  WBC 4.0 - 10.5 K/uL 8.7  5.8     7.6   Hemoglobin 12.0 - 15.0 g/dL 16.1  09.6     04.5   Hematocrit 36.0 - 46.0 % 40.0  37     41.6   Platelets 150 - 400 K/uL 246  275     263      This result is from an external source.      Latest Ref Rng & Units 10/22/2023    3:58 PM 10/04/2023   12:00 AM 10/11/2022    3:45 PM  CMP  Glucose 70 - 99 mg/dL 90   409   BUN 8 - 23 mg/dL 23  17     20    Creatinine 0.44 - 1.00 mg/dL 8.11  1.0     9.14   Sodium 135 - 145 mmol/L 140  139       139     134   Potassium 3.5 - 5.1 mmol/L 4.1  4.5     3.9   Chloride 98 - 111 mmol/L 101  101     99   CO2 22 - 32 mmol/L 29  28     28    Calcium  8.9 - 10.3 mg/dL 9.6  9.1     9.1   Total Protein 6.5 - 8.1 g/dL 7.3   7.5   Total Bilirubin 0.0 - 1.2 mg/dL 0.4   0.7   Alkaline Phos 38 - 126 U/L 85  76     72   AST 15 - 41 U/L 39  31     38   ALT 0 - 44 U/L 45  38     61      This result is from an external source.   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          - CALCIFICATIONS ARE PRESENT           - NO MALIGNANCY IDENTIFIED     Allergies:  Allergies  Allergen Reactions   Entresto  [Sacubitril-Valsartan ] Itching   Plavix  [Clopidogrel  Bisulfate] Itching   Codeine Nausea And Vomiting   Oxycodone -Acetaminophen  Other (See Comments)    Hallucinations    Current Medications: Current  Outpatient Medications  Medication Sig Dispense Refill   aspirin  EC 81 MG tablet Take 1 tablet (81 mg total) by mouth daily. Swallow whole. 90 tablet 3   atorvastatin  (LIPITOR) 20 MG tablet Take 20 mg by mouth at bedtime.     CALCIUM  CITRATE PO Take 2,000 Units by mouth daily.     Cholecalciferol (VITAMIN D3) 50 MCG (2000 UT) TABS Take 1 tablet by mouth daily.     citalopram  (CELEXA ) 20 MG tablet Take 20 mg by mouth daily.     clobetasol cream (TEMOVATE) 0.05 % Apply 1 application topically 2 (two) times daily as needed (vaginal irritation).     estradiol (ESTRACE) 0.1 MG/GM vaginal cream Place 1 Applicatorful vaginally at bedtime.  furosemide  (LASIX ) 20 MG tablet TAKE 1 TABLET BY MOUTH EVERY DAY 90 tablet 0   meloxicam (MOBIC) 15 MG tablet Take 15 mg by mouth daily.     Multiple Vitamin (MULTIVITAMIN) tablet Take 1 tablet by mouth daily.     pantoprazole  (PROTONIX ) 40 MG tablet Take 40 mg by mouth daily.     Probiotic Product (PROBIOTIC-10 PO) Take 1 tablet by mouth every other day.     valACYclovir (VALTREX) 1000 MG tablet Take 1,000 mg by mouth daily.     valsartan  (DIOVAN ) 40 MG tablet Take 1 tablet (40 mg total) by mouth 2 (two) times daily. 180 tablet 3   No current facility-administered medications for this visit.   ASSESSMENT & PLAN:  Assessment:   1.  Remote history of stage IA hormone receptor positive breast cancer, diagnosed in July 2009.  She remains without evidence of recurrence.    2.  Osteopenia for which she is taking oral calcium  and vitamin D. Bone density scans are scheduled through Dr. Geraldo Klippel.    3.  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 She will have a lumpectomy.  Plan: She had a screening bilateral mammogram done on 09/16/2023 that revealed a suspicious cluster of microcalcifications in the left breast.  She had 2 biopsies done and pathology revealed calcifications and atypical ductal hyperplasia  bordering on ductal carcinoma in situ. Her lumpectomy has already been scheduled on 10/31/2023 to be done by Dr. Britta Candy. I informed her that she will not need radiation but recommended preventative medication for the recurrence of breast cancer. I recommended an oral hormonal therapy such as Raloxifene and will discuss that further with her when she returns after we have the final pathology. She has a WBC of 8.7, hemoglobin of 13.7, and platelet count of 246,000. Her CMP is normal other than a elevated creatinine of 1.04 and ALT of 45, a chronic finding. I will see her back in 3-4 weeks for reevaluation of her healing and review of her pathology. The patient understands the plans discussed today and is in agreement with them.  She knows to contact our office if she develops concerns regarding her breast cancer.  I provided 20 minutes of face-to-face time during this this encounter and > 50% was spent counseling as documented under my assessment and plan.   Nolia Baumgartner, MD [facility] DE[facility] OF [facility] [address] [place] [address] Dept: [phone] Dept: Elenore Griffon: [phone]  No orders of the defined types were placed in this encounter.   I,Jasmine M Lassiter,acting as a scribe for Nolia Baumgartner, MD.,have documented all relevant documentation on the behalf of Nolia Baumgartner, MD,as directed by  Nolia Baumgartner, MD while in the presence of Nolia Baumgartner, MD.

## 2023-11-15 ENCOUNTER — Encounter: Payer: Self-pay | Admitting: Oncology

## 2023-11-15 ENCOUNTER — Telehealth: Payer: Self-pay | Admitting: Oncology

## 2023-11-15 ENCOUNTER — Other Ambulatory Visit: Payer: Self-pay | Admitting: Oncology

## 2023-11-15 ENCOUNTER — Inpatient Hospital Stay (HOSPITAL_BASED_OUTPATIENT_CLINIC_OR_DEPARTMENT_OTHER): Admitting: Oncology

## 2023-11-15 ENCOUNTER — Inpatient Hospital Stay

## 2023-11-15 VITALS — BP 157/86 | HR 76 | Temp 97.7°F | Resp 18 | Ht 65.0 in | Wt 216.4 lb

## 2023-11-15 DIAGNOSIS — Z170 Estrogen receptor positive status [ER+]: Secondary | ICD-10-CM | POA: Diagnosis not present

## 2023-11-15 DIAGNOSIS — C50312 Malignant neoplasm of lower-inner quadrant of left female breast: Secondary | ICD-10-CM | POA: Diagnosis not present

## 2023-11-15 DIAGNOSIS — N6092 Unspecified benign mammary dysplasia of left breast: Secondary | ICD-10-CM | POA: Diagnosis not present

## 2023-11-15 DIAGNOSIS — Z9221 Personal history of antineoplastic chemotherapy: Secondary | ICD-10-CM | POA: Diagnosis not present

## 2023-11-15 DIAGNOSIS — Z853 Personal history of malignant neoplasm of breast: Secondary | ICD-10-CM | POA: Diagnosis not present

## 2023-11-15 DIAGNOSIS — C50912 Malignant neoplasm of unspecified site of left female breast: Secondary | ICD-10-CM

## 2023-11-15 DIAGNOSIS — Z923 Personal history of irradiation: Secondary | ICD-10-CM | POA: Diagnosis not present

## 2023-11-15 DIAGNOSIS — R7989 Other specified abnormal findings of blood chemistry: Secondary | ICD-10-CM | POA: Diagnosis not present

## 2023-11-15 LAB — CMP (CANCER CENTER ONLY)
ALT: 43 U/L (ref 0–44)
AST: 32 U/L (ref 15–41)
Albumin: 4.4 g/dL (ref 3.5–5.0)
Alkaline Phosphatase: 87 U/L (ref 38–126)
Anion gap: 11 (ref 5–15)
BUN: 24 mg/dL — ABNORMAL HIGH (ref 8–23)
CO2: 27 mmol/L (ref 22–32)
Calcium: 9.8 mg/dL (ref 8.9–10.3)
Chloride: 102 mmol/L (ref 98–111)
Creatinine: 0.93 mg/dL (ref 0.44–1.00)
GFR, Estimated: >60 mL/min (ref >60)
Glucose, Bld: 110 mg/dL — ABNORMAL HIGH (ref 70–99)
Potassium: 4.5 mmol/L (ref 3.5–5.1)
Sodium: 140 mmol/L (ref 135–145)
Total Bilirubin: 0.4 mg/dL (ref 0.0–1.2)
Total Protein: 7.3 g/dL (ref 6.5–8.1)

## 2023-11-15 LAB — CBC WITH DIFFERENTIAL (CANCER CENTER ONLY)
Abs Immature Granulocytes: 0.02 10*3/uL (ref 0.00–0.07)
Basophils Absolute: 0 10*3/uL (ref 0.0–0.1)
Basophils Relative: 1 %
Eosinophils Absolute: 0.3 10*3/uL (ref 0.0–0.5)
Eosinophils Relative: 4 %
HCT: 39.8 % (ref 36.0–46.0)
Hemoglobin: 13.5 g/dL (ref 12.0–15.0)
Immature Granulocytes: 0 %
Lymphocytes Relative: 37 %
Lymphs Abs: 2.6 10*3/uL (ref 0.7–4.0)
MCH: 32.4 pg (ref 26.0–34.0)
MCHC: 33.9 g/dL (ref 30.0–36.0)
MCV: 95.4 fL (ref 80.0–100.0)
Monocytes Absolute: 0.6 10*3/uL (ref 0.1–1.0)
Monocytes Relative: 9 %
Neutro Abs: 3.5 10*3/uL (ref 1.7–7.7)
Neutrophils Relative %: 49 %
Platelet Count: 244 10*3/uL (ref 150–400)
RBC: 4.17 MIL/uL (ref 3.87–5.11)
RDW: 12.4 % (ref 11.5–15.5)
WBC Count: 7 10*3/uL (ref 4.0–10.5)
nRBC: 0 % (ref 0.0–0.2)
nRBC: 0 /100{WBCs}

## 2023-11-15 MED ORDER — LETROZOLE 2.5 MG PO TABS
2.5000 mg | ORAL_TABLET | Freq: Every day | ORAL | 5 refills | Status: DC
Start: 2023-11-15 — End: 2024-05-03

## 2023-11-15 NOTE — Progress Notes (Signed)
 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 female who we began seeing in August 2014 for follow-up of breast cancer, when she moved to the area from [place] .  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  She was treated with lumpectomy.  Pathology revealed a 1.5 cm, grade 3, invasive ductal carcinoma with a negative nodes.  Estrogen and progesterone receptors were positive and her 2 Neu negative.  Oncotype DX score was 25, which is in an intermediate risk category.  She received adjuvant chemotherapy with Taxotere and Cytoxan for 3 months, followed by adjuvant radiation to the left breast, as well as Arimidex for 5 years completed in March 2015.  She is here for yearly routine follow-up and states she has been doing fairly well.  She denies any changes in her breasts.  Prior to her visit today she did undergo bilateral diagnostic mammogram.  She has had problems with severe bladder/urethral/vaginal pressure with urinary frequency.  She was seen by Dr. Clarke Crouch and underwent cystoscopy, which was negative.  He felt her symptoms were most likely due to vaginal dryness.  Her symptoms did not improve with Vesicare.  In 2016, she saw Dr. Stephen Ehrlich for cardiac evaluation and was found to have congestive heart failure, as well as aortic stenosis.  She was placed on losartan /hydrochlorothiazide  50/12.5 daily.  She had an echocardiogram at that time.  She had shoulder surgery in 06-18-15.  She had osteopenia on bone density in March 2015.  Repeat bone density scan in 2017 revealed persistent  osteopenia.  There was a 6% worsening of her spine, but a 3% improvement of her left forearm.  Her husband passed away in 11/26/18after a prolonged illness.  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  She states that she has had two sisters who had breast cancer, and had to undergo bilateral mastectomies.  She also had a sister who died of lung cancer at age 60.  One of the sisters that had breast cancer had advanced bladder cancer and required total cystectomy.  So far genetic testing has been negative on family members.  INTERVAL HISTORY:  When [name] was here for her annual follow-up, she had a new abnormal mammogram. She has a history of stage IA hormone receptor positive left breast cancer in 2014 This was treated with chemotherapy with TC for 3 months, radiation, and Arimidex for 3 years. She had a screening bilateral mammogram done on 09/16/2023 that revealed a suspicious cluster of microcalcifications in the left breast.  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 She had her lumpectomy done on 10/31/2023 by Dr. Britta Candy and she has been healing very well. 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 This is PR positive at 100%, ER positive at 80%, HER2 negative (0), and had a KI67 at <1%, all  margins were clear. Due to this I recommended she be placed on a hormone blocker for preventative measures. She has already completed 5 years of Anastrozole nearly 10 years ago so I recommended Letrozole . I will prescribe Letrozole  2.5 mg daily. We reviewed the potential toxicities. Patient states that she feels well and has no complaints of pain but does still experiences hot flashes. She has a WBC of 7.0, hemoglobin of 13.5, and  platelet count of 244,000. Her CMP is normal other than a elevated BUN of 24. I will see her back in 3 months for reevaluation. She denies fever, chills, night sweats, or other signs of infection. She denies cardiorespiratory and gastrointestinal issues. She denies pain. Her appetite is good and her weight has increased 1 pounds over last 25 days.    REVIEW OF SYSTEMS:  Review of Systems  Constitutional: Negative.  Negative for appetite change, chills, diaphoresis, fatigue, fever and unexpected weight change.  HENT:  Negative.  Negative for hearing loss, lump/mass, mouth sores, nosebleeds, sore throat, tinnitus, trouble swallowing and voice change.   Eyes: Negative.   Respiratory: Negative.  Negative for chest tightness, cough, hemoptysis, shortness of breath and wheezing.   Cardiovascular: Negative.  Negative for chest pain, leg swelling and palpitations.  Gastrointestinal: Negative.  Negative for abdominal distention, abdominal pain, blood in stool, constipation, diarrhea, nausea, rectal pain and vomiting.  Endocrine: Positive for hot flashes.  Genitourinary: Negative.  Negative for bladder incontinence, difficulty urinating, dyspareunia, dysuria, frequency, hematuria, menstrual problem, nocturia, pelvic pain, vaginal bleeding and vaginal discharge.   Musculoskeletal:  Positive for arthralgias (mainly of the bilateral hands). Negative for back pain, flank pain, gait problem, myalgias, neck pain and neck stiffness.       Soreness in the left breast from previous lumpectomy  Skin: Negative.  Negative for itching, rash and wound.  Neurological: Negative.  Negative for dizziness, extremity weakness, gait problem, headaches, light-headedness, numbness, seizures and speech difficulty.  Hematological: Negative.  Negative for adenopathy. Does not bruise/bleed easily.  Psychiatric/Behavioral: Negative.  Negative for confusion, decreased concentration, depression, sleep disturbance and suicidal ideas. The  patient is not nervous/anxious.      VITALS:  Blood pressure (!) 157/86, pulse 76, temperature 97.7 F (36.5 C), temperature source Oral, resp. rate 18, height 5\' 5"  (1.651 m), weight 216 lb 6.4 oz (98.2 kg), SpO2 96%.  Wt Readings from Last 3 Encounters:  11/15/23 216 lb 6.4 oz (98.2 kg)  10/22/23 215 lb 1.6 oz (97.6 kg)  04/02/23 214 lb (97.1 kg)    Body mass index is 36.01 kg/m.  Performance status (ECOG): 1 - Symptomatic but completely ambulatory  PHYSICAL EXAM:  Physical Exam Vitals and nursing note reviewed.  Constitutional:      General: She is not in acute distress.    Appearance: Normal appearance. She is normal weight. She is not ill-appearing, toxic-appearing or diaphoretic.  HENT:     Head: Normocephalic and atraumatic.     Right Ear: Tympanic membrane, ear canal and external ear normal. There is no impacted cerumen.     Left Ear: Tympanic membrane, ear canal and external ear normal. There is no impacted cerumen.     Nose: Nose normal. No congestion or rhinorrhea.     Mouth/Throat:     Mouth: Mucous membranes are moist.     Pharynx: Oropharynx is clear. No oropharyngeal exudate or posterior oropharyngeal erythema.  Eyes:     General: No scleral icterus.       Right eye: No  discharge.        Left eye: No discharge.     Extraocular Movements: Extraocular movements intact.     Conjunctiva/sclera: Conjunctivae normal.     Pupils: Pupils are equal, round, and reactive to light.  Neck:     Vascular: No carotid bruit.  Cardiovascular:     Rate and Rhythm: Normal rate and regular rhythm.     Pulses: Normal pulses.     Heart sounds: Normal heart sounds. No murmur heard.    No friction rub. No gallop.  Pulmonary:     Effort: Pulmonary effort is normal. No respiratory distress.     Breath sounds: Normal breath sounds. No stridor. No wheezing, rhonchi or rales.  Chest:     Chest wall: No tenderness.  Breasts:    Right: Normal.     Left: Normal.     Comments:  Incision in the far lateral left breast which is healing well along the axillary line No masses in either breasts Faded scar in the lower inner quadrant of the left breast Scar in the middle of the upper sternum from a skin cancer Abdominal:     General: Bowel sounds are normal. There is no distension.     Palpations: Abdomen is soft. There is no hepatomegaly, splenomegaly or mass.     Tenderness: There is no abdominal tenderness. There is no right CVA tenderness, left CVA tenderness, guarding or rebound.     Hernia: No hernia is present.  Musculoskeletal:        General: No swelling, tenderness, deformity or signs of injury. Normal range of motion.     Cervical back: Normal range of motion and neck supple. No rigidity or tenderness.     Right lower leg: No edema.     Left lower leg: No edema.  Lymphadenopathy:     Cervical: No cervical adenopathy.     Right cervical: No superficial, deep or posterior cervical adenopathy.    Left cervical: No superficial, deep or posterior cervical adenopathy.     Upper Body:     Right upper body: No supraclavicular, axillary or pectoral adenopathy.     Left upper body: No supraclavicular, axillary or pectoral adenopathy.  Skin:    General: Skin is warm and dry.     Coloration: Skin is not jaundiced or pale.     Findings: No bruising, erythema, lesion or rash.  Neurological:     General: No focal deficit present.     Mental Status: She is alert and oriented to person, place, and time. Mental status is at baseline.     Cranial Nerves: No cranial nerve deficit.     Sensory: No sensory deficit.     Motor: No weakness.     Coordination: Coordination normal.     Gait: Gait normal.     Deep Tendon Reflexes: Reflexes normal.  Psychiatric:        Mood and Affect: Mood normal.        Behavior: Behavior normal.        Thought Content: Thought content normal.        Judgment: Judgment normal.     LABS:      Latest Ref Rng & Units 11/15/2023    2:59  PM 10/22/2023    3:58 PM 10/04/2023   12:00 AM  CBC  WBC 4.0 - 10.5 K/uL 7.0  8.7  5.8      Hemoglobin 12.0 - 15.0 g/dL 40.9  81.1  12.8  Hematocrit 36.0 - 46.0 % 39.8  40.0  37      Platelets 150 - 400 K/uL 244  246  275         This result is from an external source.      Latest Ref Rng & Units 11/15/2023    2:59 PM 10/22/2023    3:58 PM 10/04/2023   12:00 AM  CMP  Glucose 70 - 99 mg/dL 098  90    BUN 8 - 23 mg/dL 24  23  17       Creatinine 0.44 - 1.00 mg/dL 1.19  1.47  1.0      Sodium 135 - 145 mmol/L 140  140  139       139      Potassium 3.5 - 5.1 mmol/L 4.5  4.1  4.5      Chloride 98 - 111 mmol/L 102  101  101      CO2 22 - 32 mmol/L 27  29  28       Calcium  8.9 - 10.3 mg/dL 9.8  9.6  9.1      Total Protein 6.5 - 8.1 g/dL 7.3  7.3    Total Bilirubin 0.0 - 1.2 mg/dL 0.4  0.4    Alkaline Phos 38 - 126 U/L 87  85  76      AST 15 - 41 U/L 32  39  31      ALT 0 - 44 U/L 43  45  38         This result is from an external source.   Lab Results  Component Value Date   TSH 1.48 10/04/2023   TSH 1.48 10/04/2023    STUDIES:  Final Pathology: 10/31/2023 Breast, Lumpectomy, Left Breast Invasive ductal carcinoma, grade 2, 5mm DCIS with calcifications and comedo necrosis Resolving hematoma with fibrosis PR1 00% ER 80% HER2- negative (0) Ki67 <1% All margins are clear  Pathology: 10/09/2023 Breast, left, needle core biopsy, Anterior extent       - ATYPICAL DUCTAL HYPERPLASIA BORDERING ON DUCTAL CARCINOMA IN SITU.SEE NOTE.       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          - CALCIFICATIONS ARE PRESENT           - NO MALIGNANCY IDENTIFIED     Allergies:  Allergies  Allergen Reactions   Entresto  [Sacubitril-Valsartan ] Itching   Plavix  [Clopidogrel  Bisulfate] Itching   Codeine Nausea And Vomiting    Oxycodone -Acetaminophen  Other (See Comments)    Hallucinations    Current Medications: Current Outpatient Medications  Medication Sig Dispense Refill   aspirin  EC 81 MG tablet Take 1 tablet (81 mg total) by mouth daily. Swallow whole. 90 tablet 3   atorvastatin  (LIPITOR) 20 MG tablet Take 20 mg by mouth at bedtime.     CALCIUM  CITRATE PO Take 2,000 Units by mouth daily.     Cholecalciferol (VITAMIN D3) 50 MCG (2000 UT) TABS Take 1 tablet by mouth daily.     citalopram  (CELEXA ) 20 MG tablet Take 20 mg by mouth daily.     clobetasol cream (TEMOVATE) 0.05 % Apply 1 application topically 2 (two) times daily as needed (vaginal irritation).     estradiol (ESTRACE) 0.1 MG/GM vaginal cream Place 1 Applicatorful vaginally at  bedtime.     furosemide  (LASIX ) 20 MG tablet TAKE 1 TABLET BY MOUTH EVERY DAY 90 tablet 0   letrozole  (FEMARA ) 2.5 MG tablet Take 1 tablet (2.5 mg total) by mouth daily. 30 tablet 5   meloxicam (MOBIC) 15 MG tablet Take 15 mg by mouth daily.     Multiple Vitamin (MULTIVITAMIN) tablet Take 1 tablet by mouth daily.     pantoprazole  (PROTONIX ) 40 MG tablet Take 40 mg by mouth daily.     Probiotic Product (PROBIOTIC-10 PO) Take 1 tablet by mouth every other day.     valACYclovir (VALTREX) 1000 MG tablet Take 1,000 mg by mouth daily.     valsartan  (DIOVAN ) 40 MG tablet Take 1 tablet (40 mg total) by mouth 2 (two) times daily. 180 tablet 3   No current facility-administered medications for this visit.   ASSESSMENT & PLAN:  Assessment:   1.  Remote history of stage IA hormone receptor positive breast cancer, diagnosed in July 2009.  She remains without evidence of recurrence.    2.  Osteopenia for which she is taking oral calcium  and vitamin D. Bone density scans are scheduled through Dr. Geraldo Klippel.  I will request her recent bone density scan and plan to repeat it every 2 years.   3.  Stage 1A Invasive Ductal Carcinoma. This was excised in the process of a lumpectomy for atypical  ductal hyperplasia and she did have clear margins. This was grade 2 and highly positive for hormone receptors, negative for HER2 (0), with a Ki-67 less than 1%. She will not require radiation or chemotherapy but I do recommend Letrozole  2.5 mg daily for 5 years. I reviewed the potential toxicities and will request her recent bone density scan so we can monitor her bone density.   4. 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 The sample included excision of her hematoma.   Plan: She had a screening bilateral mammogram done on 09/16/2023 that revealed a suspicious cluster of microcalcifications in the left breast.  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 She had her lumpectomy done on 10/31/2023 by Dr. Britta Candy and she has been healing very well. Final pathology of the biopsy found to be an stage IA invasive ductal carcinoma, grade 2 measuring 5mm and DCIS with calcifications and comedo necrosis. This is PR positive at 100%, ER positive at 80%, HER2 negative (0), and had a KI67 at <1%, all margins were clear. Due to this I recommended she be placed on a hormone blocker for preventative measures. She has already completed 5 years of Anastrozole nearly 10 years ago so I recommended Letrozole . I will prescribe Letrozole  2.5 mg daily. We reviewed the potential toxicities. She has a WBC of 7.0, hemoglobin of 13.5, and platelet count of 244,000. Her CMP is normal other than a elevated BUN of 24. I will see her back in 3 months for reevaluation. The patient understands the plans discussed today and is in agreement with them.  She knows to contact our office if she develops concerns regarding her breast cancer.  I provided 18 minutes of face-to-face time during this this encounter and > 50% was spent counseling as documented under my assessment and plan.    Nolia Baumgartner, MD [facility] [place] - A DEPT OF [place]. [facility] [address] [place] [address]  Dept: [phone] Dept: Elenore Griffon: [phone]  No orders of the defined types were placed in this encounter.   I,Jasmine M Lassiter,acting as a scribe for Nolia Baumgartner, MD.,have documented all relevant documentation on the behalf of Nolia Baumgartner, MD,as directed by  Nolia Baumgartner, MD while in the presence of Nolia Baumgartner, MD.

## 2023-11-15 NOTE — Telephone Encounter (Signed)
 Patient has been scheduled for follow-up visit per 11/15/23 LOS.  Pt aware of scheduled appt details.

## 2023-11-19 NOTE — Progress Notes (Unsigned)
 Phone message left requesting callback.

## 2023-11-21 ENCOUNTER — Other Ambulatory Visit: Payer: Self-pay

## 2023-11-21 NOTE — Progress Notes (Signed)
 This information is for the sake of discussion at Tumor Board only. It is not a part of the official treatment plan.

## 2023-11-29 DIAGNOSIS — C50912 Malignant neoplasm of unspecified site of left female breast: Secondary | ICD-10-CM | POA: Insufficient documentation

## 2023-12-12 ENCOUNTER — Other Ambulatory Visit

## 2023-12-20 ENCOUNTER — Ambulatory Visit: Admitting: Cardiology

## 2023-12-27 DIAGNOSIS — L578 Other skin changes due to chronic exposure to nonionizing radiation: Secondary | ICD-10-CM | POA: Diagnosis not present

## 2023-12-27 DIAGNOSIS — L814 Other melanin hyperpigmentation: Secondary | ICD-10-CM | POA: Diagnosis not present

## 2023-12-27 DIAGNOSIS — L570 Actinic keratosis: Secondary | ICD-10-CM | POA: Diagnosis not present

## 2023-12-27 DIAGNOSIS — L820 Inflamed seborrheic keratosis: Secondary | ICD-10-CM | POA: Diagnosis not present

## 2024-01-13 ENCOUNTER — Other Ambulatory Visit

## 2024-01-16 ENCOUNTER — Ambulatory Visit: Attending: Cardiology

## 2024-01-16 DIAGNOSIS — Z952 Presence of prosthetic heart valve: Secondary | ICD-10-CM | POA: Insufficient documentation

## 2024-01-18 LAB — ECHOCARDIOGRAM COMPLETE
AV Mean grad: 10.8 mmHg
AV Peak grad: 21.5 mmHg
Ao pk vel: 2.32 m/s
Area-P 1/2: 3.16 cm2
Calc EF: 43.1 %
MV M vel: 5.13 m/s
MV Peak grad: 105.1 mmHg
Radius: 0.5 cm
S' Lateral: 4.1 cm
Single Plane A2C EF: 38.8 %
Single Plane A4C EF: 42.8 %

## 2024-01-22 ENCOUNTER — Other Ambulatory Visit: Payer: Self-pay | Admitting: Cardiology

## 2024-01-23 ENCOUNTER — Ambulatory Visit: Attending: Cardiology

## 2024-01-23 VITALS — BP 122/78 | HR 77 | Ht 65.0 in | Wt 217.0 lb

## 2024-01-23 DIAGNOSIS — Z952 Presence of prosthetic heart valve: Secondary | ICD-10-CM | POA: Diagnosis not present

## 2024-01-23 DIAGNOSIS — E7800 Pure hypercholesterolemia, unspecified: Secondary | ICD-10-CM | POA: Insufficient documentation

## 2024-01-23 DIAGNOSIS — I429 Cardiomyopathy, unspecified: Secondary | ICD-10-CM | POA: Diagnosis not present

## 2024-01-23 DIAGNOSIS — I342 Nonrheumatic mitral (valve) stenosis: Secondary | ICD-10-CM | POA: Diagnosis not present

## 2024-01-23 DIAGNOSIS — I5042 Chronic combined systolic (congestive) and diastolic (congestive) heart failure: Secondary | ICD-10-CM | POA: Diagnosis not present

## 2024-01-23 DIAGNOSIS — I447 Left bundle-branch block, unspecified: Secondary | ICD-10-CM | POA: Diagnosis not present

## 2024-01-23 DIAGNOSIS — I050 Rheumatic mitral stenosis: Secondary | ICD-10-CM | POA: Insufficient documentation

## 2024-01-23 DIAGNOSIS — I10 Essential (primary) hypertension: Secondary | ICD-10-CM | POA: Diagnosis not present

## 2024-01-23 HISTORY — DX: Rheumatic mitral stenosis: I05.0

## 2024-01-23 MED ORDER — EMPAGLIFLOZIN 10 MG PO TABS: 10.0000 mg | ORAL_TABLET | Freq: Every day | ORAL | 12 refills | Status: AC

## 2024-01-23 MED ORDER — SPIRONOLACTONE 25 MG PO TABS: 12.5000 mg | ORAL_TABLET | Freq: Every day | ORAL | 3 refills | Status: DC

## 2024-01-23 MED ORDER — METOPROLOL SUCCINATE ER 25 MG PO TB24: 25.0000 mg | ORAL_TABLET | Freq: Every day | ORAL | 3 refills | Status: AC

## 2024-01-23 NOTE — Assessment & Plan Note (Signed)
 Will controlled. Continue current medications and additional medications as being started for cardiomyopathy optimization as reviewed

## 2024-01-23 NOTE — Patient Instructions (Addendum)
 Medication Instructions:  Your physician has recommended you make the following change in your medication:   Start Metoprolol  Succinate (Toprol  XL) 25 mg daily  Start Spironolactone 12.5 mg daily  Start Jardiance 10 mg daily  *If you need a refill on your cardiac medications before your next appointment, please call your pharmacy*   Lab Work: Your physician recommends that you return for lab work in: 2 weeks fir CMP, magnesium  and ProBNP You can come Monday through Friday 8:30 am to 12:00 pm and 1:15 to 4:30. You do not need to make an appointment as the order has already been placed.  If you have labs (blood work) drawn today and your tests are completely normal, you will receive your results only by: MyChart Message (if you have MyChart) OR A paper copy in the mail If you have any lab test that is abnormal or we need to change your treatment, we will call you to review the results.   Testing/Procedures:    Please report to Radiology at the [facility] Main Entrance 30 minutes early for your test.  [address] [place], [place] [address]             How to Prepare for Your Cardiac PET/CT Stress Test:  Nothing to eat or drink, except water, 3 hours prior to arrival time.  NO caffeine/decaffeinated products, or chocolate 12 hours prior to arrival. (Please note decaffeinated beverages (teas/coffees) still contain caffeine).  If you have caffeine within 12 hours prior, the test will need to be rescheduled.  Medication instructions: Do not take erectile dysfunction medications for 72 hours prior to test (sildenafil, tadalafil) Do not take nitrates (isosorbide mononitrate, Ranexa) the day before or day of test Do not take tamsulosin the day before or morning of test Hold theophylline containing medications for 12 hours. Hold Dipyridamole 48 hours prior to the test.  Diabetic Preparation: If able to eat breakfast prior to 3 hour fasting, you may take all medications,  including your insulin . Do not worry if you miss your breakfast dose of insulin  - start at your next meal. If you do not eat prior to 3 hour fast-Hold all diabetes (oral and insulin ) medications. Patients who wear a continuous glucose monitor MUST remove the device prior to scanning.  You may take your remaining medications with water.  NO perfume, cologne or lotion on chest or abdomen area. FEMALES - Please avoid wearing dresses to this appointment.  Total time is 1 to 2 hours; you may want to bring reading material for the waiting time.  IF YOU THINK YOU MAY BE PREGNANT, OR ARE NURSING PLEASE INFORM THE TECHNOLOGIST.  In preparation for your appointment, medication and supplies will be purchased.  Appointment availability is limited, so if you need to cancel or reschedule, please call the Radiology Department Scheduler at [phone] 24 hours in advance to avoid a cancellation fee of $100.00  What to Expect When you Arrive:  Once you arrive and check in for your appointment, you will be taken to a preparation room within the Radiology Department.  A technologist or Nurse will obtain your medical history, verify that you are correctly prepped for the exam, and explain the procedure.  Afterwards, an IV will be started in your arm and electrodes will be placed on your skin for EKG monitoring during the stress portion of the exam. Then you will be escorted to the PET/CT scanner.  There, staff will get you positioned on the scanner and  obtain a blood pressure and EKG.  During the exam, you will continue to be connected to the EKG and blood pressure machines.  A small, safe amount of a radioactive tracer will be injected in your IV to obtain a series of pictures of your heart along with an injection of a stress agent.    After your Exam:  It is recommended that you eat a meal and drink a caffeinated beverage to counter act any effects of the stress agent.  Drink plenty of fluids for the remainder  of the day and urinate frequently for the first couple of hours after the exam.  Your doctor will inform you of your test results within 7-10 business days.  For more information and frequently asked questions, please visit our website: [email or web address]  For questions about your test or how to prepare for your test, please call: Cardiac Imaging Nurse Navigators Office: [phone]    Follow-Up: At [facility], you and your health needs are our priority.  As part of our continuing mission to provide you with exceptional heart care, we have created designated Provider Care Teams.  These Care Teams include your primary Cardiologist (physician) and Advanced Practice Providers (APPs -  Physician Assistants and Nurse Practitioners) who all work together to provide you with the care you need, when you need it.  We recommend signing up for the patient portal called MyChart.  Sign up information is provided on this After Visit Summary.  MyChart is used to connect with patients for Virtual Visits (Telemedicine).  Patients are able to view lab/test results, encounter notes, upcoming appointments, etc.  Non-urgent messages can be sent to your provider as well.   To learn more about what you can do with MyChart, go to [email or web address].    Your next appointment:   3 month(s)  The format for your next appointment:   In Person  Provider:   Alean Kobus, MD    Other Instructions none  Important Information About Sugar

## 2024-01-23 NOTE — Assessment & Plan Note (Signed)
 CHF with mildly reduced LVEF, 45 to 50% by echocardiogram June 2025 Started to decline since April 2022 and echocardiogram with EF 40 to 45%.  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  Given decline in LVEF, would revisit any ischemic etiology with cardiac PET stress test. Optimize guideline directed medical therapy. If persistently remains low LVEF, will refer to EP to consider candidacy for CRT in the setting of LBBB.  Appears compensated and euvolemic.  Continues to weigh herself daily at home. Weight today in the office 217 pounds. Uses Lasix  20 mg once daily.  From guideline-directed medical therapy, currently on valsartan  40 mg twice daily. Did not tolerate Entresto  due to itching. Will start low-dose metoprolol  succinate 25 mg once daily. Start spironolactone 12.5 mg once daily. Start Jardiance or Farxiga depending on cost coverage 10 mg once daily.  Discussed mechanism of action of these medications and potential side effects including blood pressure changes, electrolyte abnormalities, allergic reactions, potential for skin infections or urinary tract infections.  Recommended good hygiene.  Will obtain blood work CMP,  proBNP, magnesium  tentatively in 2 weeks after starting spironolactone.

## 2024-01-23 NOTE — Progress Notes (Signed)
 Cardiology Consultation:    Date:  01/23/2024   ID:  Judith Solis, DOB 1945-08-13, MRN [id number]  PCP:  Judith Marcellus RAMAN, MD  Cardiologist:  Judith SAUNDERS Davien Malone, MD   Referring MD: Judith Marcellus RAMAN, MD   No chief complaint on file.    ASSESSMENT AND PLAN:   Judith Solis 78 year old woman with history of CHF with mildly reduced LVEF 45 to 50% on echo June 2025 [LVEF initially noted to be 40 to 45% April 2022],  cardiac cath July 2019 no angiographic evidence of CAD prior to TAVR, aortic stenosis s/p TAVR 26 mm Edwards SAPIEN 02/2018, hypertension, LBBB, hyperlipidemia, OSA-uses CPAP, breast cancer s/p chemoradiation therapy in 2009, recently with breast lump underwent lumpectomy and following up with oncologist Dr. Cornelius. Last echocardiogram to review is from 01/16/2024 with LVEF 45 to 50% mild MR, mild mitral stenosis mean gradient 3.5 mmHg at heart rate 72/min, well-seated bioprosthetic aortic valve with mean gradient 11 mmHg and dimensionless index 0.44   Here for routine follow-up visit.  Problem List Items Addressed This Visit     Hyperlipidemia   Continue with atorvastatin  20 mg once daily. Last lipid panel from March 2025 HDL 42, LDL 89.  Total cholesterol 160, triglycerides 147      Relevant Orders   EKG 12-Lead (Completed)   Essential hypertension - Primary   Will controlled. Continue current medications and additional medications as being started for cardiomyopathy optimization as reviewed      Relevant Orders   EKG 12-Lead (Completed)   S/P TAVR (transcatheter aortic valve replacement)   Well-functioning bioprosthetic valve on last echocardiogram June 2025.  Continue with annual follow-up with echocardiograms. Aware to be on antibiotic prophylaxis for dental or any other surgical procedures.       Relevant Orders   EKG 12-Lead (Completed)   CHF (congestive heart failure) (HCC)   CHF with mildly reduced LVEF, 45 to 50% by echocardiogram June 2025 Started to  decline since April 2022 and echocardiogram with EF 40 to 45%.  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  Given decline in LVEF, would revisit any ischemic etiology with cardiac PET stress test. Optimize guideline directed medical therapy. If persistently remains low LVEF, will refer to EP to consider candidacy for CRT in the setting of LBBB.  Appears compensated and euvolemic.  Continues to weigh herself daily at home. Weight today in the office 217 pounds. Uses Lasix  20 mg once daily.  From guideline-directed medical therapy, currently on valsartan  40 mg twice daily. Did not tolerate Entresto  due to itching. Will start low-dose metoprolol  succinate 25 mg once daily. Start spironolactone 12.5 mg once daily. Start Jardiance or Farxiga depending on cost coverage 10 mg once daily.  Discussed mechanism of action of these medications and potential side effects including blood pressure changes, electrolyte abnormalities, allergic reactions, potential for skin infections or urinary tract infections.  Recommended good hygiene.  Will obtain blood work CMP,  proBNP, magnesium  tentatively in 2 weeks after starting spironolactone.       Mitral stenosis   Mild mitral stenosis. Continue to follow-up with annual echocardiograms      Other Visit Diagnoses       Cardiomyopathy, unspecified type (HCC)         Left bundle branch block  Return to clinic tentatively in 3 months  History of Present Illness:    Judith Solis is a 78 y.o. female who is being seen today for follow-up visit. PCP is Judith Marcellus RAMAN, MD. Last visit with our office was 12/25/2022 with Judith Solis.  Very pleasant woman here for the visit by herself.  Mentions she lives by herself at home.   Her daughter lives nearby.  She keeps herself busy with day-to-day activities and likes to work in her yard.  History of CHF with mildly reduced LVEF 45 to 50% on echo June 2025 [LVEF initially noted to be 40 to 45% April 2022],  cardiac cath July 2019 no angiographic evidence of CAD prior to TAVR, aortic stenosis s/p TAVR 26 mm Edwards SAPIEN 02/2018, hypertension, LBBB, hyperlipidemia, OSA-uses CPAP, breast cancer s/p chemoradiation therapy in 2009, recently with breast lump underwent lumpectomy and following up with oncologist Dr. Cornelius. Last echocardiogram to review is from 01/16/2024 with LVEF 45 to 50% mild MR, mild mitral stenosis mean gradient 3.5 mmHg at heart rate 72/min, well-seated bioprosthetic aortic valve with mean gradient 11 mmHg and dimensionless index 0.44  Mentions over the past couple years she has been noticing her functional capacity has decreased feeling out of breath with anything more than mild day-to-day activities.  Working in her yard tends to get her out of breath easily.  In addition she also has musculoskeletal issues with generalized aches and pains that limit her from working in her yard.  Denies any significant symptoms at rest. Denies any pedal edema, orthopnea, paroxysmal nocturnal dyspnea. Denies any palpitations, lightheadedness, dizziness or syncopal episodes. Denies any blood in urine or stools.  Compliance with her medications  EKG with sinus rhythm heart rate 77/min, QRS morphology consistent with LBBB 144 ms duration.  Prior EKG available for comparison is from 2019 that shows narrow QRS.  Last lipid panel available to review is from 10/04/2023 total cholesterol 160, HDL 42, LDL 89, triglycerides 147. Recent CBC and CMP from November 15, 2023 were unremarkable. Thyroid  panel from 10/04/2023 was unremarkable.   Past Medical History:  Diagnosis Date   Aortic valve regurgitation    Arthritis    Asthma    Breast cancer (HCC)    a. 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   26 mm Edwards Sapien 3 transcatheter heart valve placed via percutaneous right transfemoral approach    Severe aortic stenosis     Past Surgical History:  Procedure Laterality Date   ABDOMINAL HYSTERECTOMY     BELPHAROPTOSIS REPAIR Bilateral    BREAST BIOPSY Left 10/09/2023   MM LT BREAST BX W LOC DEV EA AD LESION IMG BX SPEC STEREO GUIDE 10/09/2023 [facility]   BREAST BIOPSY Left 10/09/2023   MM LT BREAST BX W LOC DEV 1ST LESION IMAGE BX SPEC STEREO GUIDE 10/09/2023 [facility]   BREAST SURGERY     LUMPECTOMY WITH CHEMO & RADIATION   CATARACT EXTRACTION     EYE SURGERY     INTRAOPERATIVE TRANSTHORACIC ECHOCARDIOGRAM  03/04/2018   Procedure: INTRAOPERATIVE TRANSTHORACIC ECHOCARDIOGRAM;  Surgeon: Wonda Sharper, MD;  Location: [facility] OR;  Service: Open Heart Surgery;;   JOINT REPLACEMENT  BILATERAL HIPS 2007 & 2008   RIGHT/LEFT HEART CATH AND CORONARY ANGIOGRAPHY N/A 01/31/2018   Procedure: RIGHT/LEFT HEART CATH AND CORONARY ANGIOGRAPHY;  Surgeon: Verlin Lonni BIRCH, MD;  Location: [facility];  Service: Cardiovascular;  Laterality: N/A;   ROTATOR CUFF REPAIR     TONSILLECTOMY     TRANSCATHETER AORTIC VALVE REPLACEMENT, TRANSFEMORAL N/A 03/04/2018   Procedure: TRANSCATHETER AORTIC VALVE REPLACEMENT, TRANSFEMORAL. EDWARDS SAPIEN 3 TRANSCATHETER HEART VALVE.;  Surgeon: Wonda Sharper, MD;  Location: [facility] OR;  Service: Open Heart Surgery;  Laterality: N/A;    Current Medications: Current Meds  Medication Sig   aspirin  EC 81 MG tablet Take 1 tablet (81 mg total) by mouth daily. Swallow whole.   atorvastatin  (LIPITOR) 20 MG tablet Take 20 mg by mouth at bedtime.   CALCIUM  CITRATE PO  Take 2,000 Units by mouth daily.   Cholecalciferol (VITAMIN D3) 50 MCG (2000 UT) TABS Take 1 tablet by mouth daily.   citalopram  (CELEXA ) 20 MG tablet Take 20 mg by mouth daily.   clobetasol cream (TEMOVATE) 0.05 % Apply 1 application topically 2 (two) times daily as needed (vaginal irritation).   furosemide  (LASIX ) 20 MG tablet Take 1 tablet (20 mg total) by mouth daily. Please keep scheduled appointment for future refills. Thank you.   letrozole  (FEMARA ) 2.5 MG tablet Take 1 tablet (2.5 mg total) by mouth daily.   meloxicam (MOBIC) 15 MG tablet Take 15 mg by mouth daily.   Multiple Vitamin (MULTIVITAMIN) tablet Take 1 tablet by mouth daily.   nystatin (MYCOSTATIN/NYSTOP) powder Apply topically 2 (two) times daily.   pantoprazole  (PROTONIX ) 40 MG tablet Take 40 mg by mouth daily.   Probiotic Product (PROBIOTIC-10 PO) Take 1 tablet by mouth every other day.   valACYclovir (VALTREX) 1000 MG tablet Take 1,000 mg by mouth daily.   valsartan  (DIOVAN ) 40 MG tablet Take 1 tablet (40 mg total) by mouth 2 (two) times daily.     Allergies:   Entresto  [sacubitril-valsartan ], Plavix  [clopidogrel  bisulfate], Codeine, and Oxycodone -acetaminophen    Social History   Socioeconomic History   Marital status: Widowed    Spouse name: Not on file   Number of children: 1   Years of education: Not on file   Highest education level: GED or equivalent  Occupational History   Not on file  Tobacco Use   Smoking status: Former    Current packs/day: 0.00    Average packs/day: 1.5 packs/day for 20.0 years (30.0 ttl pk-yrs)    Types: Cigarettes    Start date: 06/04/1970    Quit date: 06/04/1990    Years since quitting: 33.6    Passive exposure: Past   Smokeless tobacco: Never  Vaping Use   Vaping status: Never Used  Substance and Sexual Activity   Alcohol use: No    Comment: none since 1985   Drug use: No   Sexual activity: Not on file  Other Topics Concern   Not on file  Social History Narrative    Lives at home alone   Right handed   Caffeine: 1-2 cups/day of coffee   Social Drivers of Corporate investment banker Strain: Not on file  Food Insecurity: No Food Insecurity (06/28/2021)   Hunger Vital Sign    Worried About Running Out of Food in the Last Year: Never true    Ran Out of Food in the Last Year: Never true  Transportation Needs: No Transportation Needs (06/28/2021)   PRAPARE - Transportation    Lack  of Transportation (Medical): No    Lack of Transportation (Non-Medical): No  Physical Activity: Not on file  Stress: Not on file  Social Connections: Not on file     Family History: The patient's family history includes Bladder Cancer (age of onset: 83) in her sister; Breast cancer in her cousin and cousin; Breast cancer (age of onset: 60) in her sister; Breast cancer (age of onset: 84) in her sister; Cancer in her niece; Diabetes Mellitus II in her paternal grandfather, sister, and sister; Heart Problems in her brother and sister; Heart disease in her brother, maternal grandfather, and mother; Hepatitis C in her sister; Kidney cancer in her niece; Lung cancer in her cousin and paternal uncle; Lung cancer (age of onset: 64) in her sister; Stroke in her father; Throat cancer in her sister. There is no history of Sleep apnea. ROS:   Please see the history of present illness.    All 14 point review of systems negative except as described per history of present illness.  EKGs/Labs/Other Studies Reviewed:    The following studies were reviewed today:   EKG:  EKG Interpretation Date/Time:  Thursday January 23 2024 09:46:49 EDT Ventricular Rate:  77 PR Interval:  178 QRS Duration:  144 QT Interval:  436 QTC Calculation: 493 R Axis:   -19  Text Interpretation: Normal sinus rhythm Left bundle branch block When compared with ECG of 04-Mar-2018 23:57, PR interval has decreased Left bundle branch block is now Present Confirmed by Liborio Hai reddy [phone]) on 01/23/2024 9:55:21  AM    Recent Labs: 10/04/2023: TSH 1.48; TSH 1.48 11/15/2023: ALT 43; BUN 24; Creatinine 0.93; Hemoglobin 13.5; Platelet Count 244; Potassium 4.5; Sodium 140  Recent Lipid Panel    Component Value Date/Time   CHOL 160 10/04/2023 0000   CHOL 161 06/28/2022 0933   TRIG 147 10/04/2023 0000   HDL 42 10/04/2023 0000   HDL 43 06/28/2022 0933   CHOLHDL 3.7 06/28/2022 0933   LDLCALC 89 10/04/2023 0000   LDLCALC 90 06/28/2022 0933    Physical Exam:    VS:  BP 122/78 (BP Location: Right Arm, Patient Position: Sitting)   Pulse 77   Ht 5' 5 (1.651 m)   Wt 217 lb (98.4 kg)   SpO2 93%   BMI 36.11 kg/m     Wt Readings from Last 3 Encounters:  01/23/24 217 lb (98.4 kg)  11/15/23 216 lb 6.4 oz (98.2 kg)  10/22/23 215 lb 1.6 oz (97.6 kg)     GENERAL:  Well nourished, well developed in no acute distress NECK: No JVD; No carotid bruits CARDIAC: RRR, S1 and S2 present, 4/6 ejection systolic murmur best heard right upper sternal border CHEST:  Clear to auscultation without rales, wheezing or rhonchi  Extremities: No pitting pedal edema. Pulses bilaterally symmetric with radial 2+ and dorsalis pedis 2+ NEUROLOGIC:  Alert and oriented x 3  Medication Adjustments/Labs and Tests Ordered: Current medicines are reviewed at length with the patient today.  Concerns regarding medicines are outlined above.  Orders Placed This Encounter  Procedures   EKG 12-Lead   No orders of the defined types were placed in this encounter.   Signed, Hai jess Liborio, MD, MPH, [facility] 01/23/2024 10:45 AM    [facility]

## 2024-01-23 NOTE — Assessment & Plan Note (Signed)
 Well-functioning bioprosthetic valve on last echocardiogram June 2025.  Continue with annual follow-up with echocardiograms. Aware to be on antibiotic prophylaxis for dental or any other surgical procedures.

## 2024-01-23 NOTE — Progress Notes (Deleted)
ekg ekg

## 2024-01-23 NOTE — Assessment & Plan Note (Signed)
 Continue with atorvastatin  20 mg once daily. Last lipid panel from March 2025 HDL 42, LDL 89.  Total cholesterol 160, triglycerides 147

## 2024-01-23 NOTE — Assessment & Plan Note (Signed)
 Mild mitral stenosis. Continue to follow-up with annual echocardiograms

## 2024-02-03 DIAGNOSIS — R300 Dysuria: Secondary | ICD-10-CM | POA: Diagnosis not present

## 2024-02-10 DIAGNOSIS — I10 Essential (primary) hypertension: Secondary | ICD-10-CM | POA: Diagnosis not present

## 2024-02-10 DIAGNOSIS — I447 Left bundle-branch block, unspecified: Secondary | ICD-10-CM | POA: Diagnosis not present

## 2024-02-10 DIAGNOSIS — I429 Cardiomyopathy, unspecified: Secondary | ICD-10-CM | POA: Diagnosis not present

## 2024-02-11 LAB — COMPREHENSIVE METABOLIC PANEL WITH GFR
ALT: 46 IU/L — ABNORMAL HIGH (ref 0–32)
AST: 35 IU/L (ref 0–40)
Albumin: 4.6 g/dL (ref 3.8–4.8)
Alkaline Phosphatase: 103 IU/L (ref 44–121)
BUN/Creatinine Ratio: 29 — ABNORMAL HIGH (ref 12–28)
BUN: 41 mg/dL — ABNORMAL HIGH (ref 8–27)
Bilirubin Total: 0.6 mg/dL (ref 0.0–1.2)
CO2: 20 mmol/L (ref 20–29)
Calcium: 10.1 mg/dL (ref 8.7–10.3)
Chloride: 99 mmol/L (ref 96–106)
Creatinine, Ser: 1.42 mg/dL — ABNORMAL HIGH (ref 0.57–1.00)
Globulin, Total: 2.9 g/dL (ref 1.5–4.5)
Glucose: 117 mg/dL — ABNORMAL HIGH (ref 70–99)
Potassium: 5.1 mmol/L (ref 3.5–5.2)
Sodium: 137 mmol/L (ref 134–144)
Total Protein: 7.5 g/dL (ref 6.0–8.5)
eGFR: 37 mL/min/1.73 — ABNORMAL LOW (ref >59)

## 2024-02-11 LAB — PRO B NATRIURETIC PEPTIDE: NT-Pro BNP: 162 pg/mL (ref 0–738)

## 2024-02-11 LAB — MAGNESIUM: Magnesium: 2.3 mg/dL (ref 1.6–2.3)

## 2024-02-13 NOTE — Progress Notes (Signed)
 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 female who we began seeing in August 2014 for follow-up of breast cancer, when she moved to the area from [place] .  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  She was treated with lumpectomy.  Pathology revealed a 1.5 cm, grade 3, invasive ductal carcinoma with a negative nodes.  Estrogen and progesterone receptors were positive and her 2 Neu negative.  Oncotype DX score was 25, which is in an intermediate risk category.  She received adjuvant chemotherapy with Taxotere and Cytoxan for 3 months, followed by adjuvant radiation to the left breast, as well as Arimidex for 5 years completed in March 2015.  She is here for yearly routine follow-up and states she has been doing fairly well.  She denies any changes in her breasts.  Prior to her visit today she did undergo bilateral diagnostic mammogram.  She has had problems with severe bladder/urethral/vaginal pressure with urinary frequency.  She was seen by Dr. Gaston and underwent cystoscopy, which was negative.  He felt her symptoms were most likely due to vaginal dryness.  Her symptoms did not improve with Vesicare.  In 2016, she saw Dr. Josephine for cardiac evaluation and was found to have congestive heart failure, as well as aortic stenosis.  She was placed on losartan /hydrochlorothiazide  50/12.5 daily.  She had an echocardiogram at that time.  She had shoulder surgery in June 25, 2015.  She had osteopenia on bone density in March 2015.  Repeat bone density scan in 2017 revealed persistent  osteopenia.  There was a 6% worsening of her spine, but a 3% improvement of her left forearm.  Her husband passed away in 03-Dec-2018after a prolonged illness.  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  She states that she has had two sisters who had breast cancer, and had to undergo bilateral mastectomies.  She also had a sister who died of lung cancer at age 39.  One of the sisters that had breast cancer had advanced bladder cancer and required total cystectomy.  So far genetic testing has been negative on family members.  Screening bilateral mammogram done on 09/16/2023 that revealed a suspicious cluster of microcalcifications in the left breast.  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 She had her lumpectomy done on 10/31/2023 by Dr. Joesph and she has been healing very well. 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 This is PR positive at 100%, ER positive at 80%, HER2 negative (0), and had a KI67 at <1%, all margins were clear.  She was placed on hormonal therapy with letrozole  since April 2025.  INTERVAL HISTORY:  Judith Solis was here for her follow-up of 2 breast cancers. She has a history of stage IA hormone receptor positive left breast cancer in 2014 This was treated with chemotherapy with TC  for 3 months, radiation, and Arimidex for 3 years. She was found to have  stage IA (T1aN0M0) invasive ductal carcinoma, grade 2 in April, 2025. Pathology also showed ADH and 5 mm of invasive ductal carcinoma. She had already completed 5 years of Anastrozole nearly 10 years ago so she has now started Letrozole  2.5 mg daily since April 2025. Patient states that she feels well and has no complaints of pain. She continues letrozole   2.5 mg but experiences hot flashes. We discussed bra prothesis since she has had 2 lumpectomies of the left breast and I gave her a pamphlet on [facility] in [place]. She had labs done on 02/10/2024 and had a elevated creatinine of 1.42 with a BUN of 41 and ALT of 46, the rest of her CMP was normal. She is taking Lasix  20 mg once daily and I advised her to discuss this dosage with her cardiologist. She had an echocardiogram done on 01/23/2024 which revealed an ejection fraction of 45-50% and she has an NM PET CT cardiac perfusion scheduled also. I will see her back in 3 months for reevaluation. She denies fever, chills, night sweats, or other signs of infection. She denies cardiorespiratory and gastrointestinal issues. She  denies pain. Her appetite is good and Her weight has decreased 1 pounds over last 3 months.  REVIEW OF SYSTEMS:  Review of Systems  Constitutional: Negative.  Negative for appetite change, chills, diaphoresis, fatigue, fever and unexpected weight change.  HENT:  Negative.  Negative for hearing loss, lump/mass, mouth sores, nosebleeds, sore throat, tinnitus, trouble swallowing and voice change.   Eyes: Negative.   Respiratory: Negative.  Negative for chest tightness, cough, hemoptysis, shortness of breath and wheezing.   Cardiovascular: Negative.  Negative for chest pain, leg swelling and palpitations.  Gastrointestinal: Negative.  Negative for abdominal distention, abdominal pain, blood in stool, constipation, diarrhea, nausea, rectal pain and vomiting.  Endocrine: Positive for hot flashes (severe).  Genitourinary: Negative.  Negative for bladder incontinence, difficulty urinating, dyspareunia, dysuria, frequency, hematuria, menstrual problem, nocturia, pelvic pain, vaginal bleeding and vaginal discharge.   Musculoskeletal:  Positive for arthralgias (mainly of the bilateral hands). Negative for back pain, flank pain, gait problem, myalgias, neck pain and neck stiffness.   Skin: Negative.  Negative for itching, rash and wound.  Neurological: Negative.  Negative for dizziness, extremity weakness, gait problem, headaches, light-headedness, numbness, seizures and speech difficulty.  Hematological: Negative.  Negative for adenopathy. Does not bruise/bleed easily.  Psychiatric/Behavioral: Negative.  Negative for confusion, decreased concentration, depression, sleep disturbance and suicidal ideas. The patient is not nervous/anxious.      VITALS:  Blood pressure 105/65, pulse 65, temperature 97.9 F (36.6 C), temperature source Oral, resp. rate 16, height 5' 5 (1.651 m), weight 215 lb 3.2 oz (97.6 kg), SpO2 96%.  Wt Readings from Last 3 Encounters:  02/14/24 215 lb 3.2 oz (97.6 kg)  01/23/24 217 lb (98.4 kg)  11/15/23 216 lb 6.4 oz (98.2 kg)    Body mass index is 35.81 kg/m.  Performance status (ECOG): 1 - Symptomatic but completely ambulatory  PHYSICAL EXAM:  Physical Exam Vitals and nursing note reviewed.  Constitutional:      General: She is not in acute distress.    Appearance: Normal appearance. She is normal weight. She is not ill-appearing, toxic-appearing or diaphoretic.  HENT:     Head: Normocephalic and atraumatic.     Right Ear: Tympanic membrane, ear canal and external ear normal. There is no impacted cerumen.  Left Ear: Tympanic membrane, ear canal and external ear normal. There is no impacted cerumen.     Nose: Nose normal. No congestion or rhinorrhea.     Mouth/Throat:     Mouth: Mucous membranes are moist.     Pharynx: Oropharynx is clear. No oropharyngeal exudate or posterior oropharyngeal erythema.  Eyes:     General: No scleral icterus.       Right eye: No discharge.        Left eye: No discharge.     Extraocular Movements: Extraocular movements intact.     Conjunctiva/sclera: Conjunctivae normal.     Pupils: Pupils are equal, round, and reactive to light.  Neck:     Vascular: No carotid bruit.  Cardiovascular:     Rate and  Rhythm: Normal rate and regular rhythm.     Pulses: Normal pulses.     Heart sounds: Normal heart sounds. No murmur heard.    No friction rub. No gallop.  Pulmonary:     Effort: Pulmonary effort is normal. No respiratory distress.     Breath sounds: Normal breath sounds. No stridor. No wheezing, rhonchi or rales.  Chest:     Chest wall: No tenderness.  Breasts:    Right: Normal.     Left: Normal.     Comments: Well healed scar in the skin of the right upper sternum area Well healed scar in the far lateral left breast which is healing well at about 3 o'clock Old fading scar in the lower innner quadrant of the left breast Abdominal:     General: Bowel sounds are normal. There is no distension.     Palpations: Abdomen is soft. There is no hepatomegaly, splenomegaly or mass.     Tenderness: There is no abdominal tenderness. There is no right CVA tenderness, left CVA tenderness, guarding or rebound.     Hernia: No hernia is present.  Musculoskeletal:        General: No swelling, tenderness, deformity or signs of injury. Normal range of motion.     Cervical back: Normal range of motion and neck supple. No rigidity or tenderness.     Right lower leg: No edema.     Left lower leg: No edema.  Lymphadenopathy:     Cervical: No cervical adenopathy.     Right cervical: No superficial, deep or posterior cervical adenopathy.    Left cervical: No superficial, deep or posterior cervical adenopathy.     Upper Body:     Right upper body: No supraclavicular, axillary or pectoral adenopathy.     Left upper body: No supraclavicular, axillary or pectoral adenopathy.  Skin:    General: Skin is warm and dry.     Coloration: Skin is not jaundiced or pale.     Findings: No bruising, erythema, lesion or rash.  Neurological:     General: No focal deficit present.     Mental Status: She is alert and oriented to person, place, and time. Mental status is at baseline.     Cranial Nerves: No cranial nerve  deficit.     Sensory: No sensory deficit.     Motor: No weakness.     Coordination: Coordination normal.     Gait: Gait normal.     Deep Tendon Reflexes: Reflexes normal.  Psychiatric:        Mood and Affect: Mood normal.        Behavior: Behavior normal.        Thought Content: Thought content normal.  Judgment: Judgment normal.     LABS:      Latest Ref Rng & Units 11/15/2023    2:59 PM 10/22/2023    3:58 PM 10/04/2023   12:00 AM  CBC  WBC 4.0 - 10.5 K/uL 7.0  8.7  5.8      Hemoglobin 12.0 - 15.0 g/dL 86.4  86.2  87.1      Hematocrit 36.0 - 46.0 % 39.8  40.0  37      Platelets 150 - 400 K/uL 244  246  275         This result is from an external source.      Latest Ref Rng & Units 02/10/2024   10:22 AM 11/15/2023    2:59 PM 10/22/2023    3:58 PM  CMP  Glucose 70 - 99 mg/dL 882  889  90   BUN 8 - 27 mg/dL 41  24  23   Creatinine 0.57 - 1.00 mg/dL 8.57  9.06  8.95   Sodium 134 - 144 mmol/L 137  140  140   Potassium 3.5 - 5.2 mmol/L 5.1  4.5  4.1   Chloride 96 - 106 mmol/L 99  102  101   CO2 20 - 29 mmol/L 20  27  29    Calcium  8.7 - 10.3 mg/dL 89.8  9.8  9.6   Total Protein 6.0 - 8.5 g/dL 7.5  7.3  7.3   Total Bilirubin 0.0 - 1.2 mg/dL 0.6  0.4  0.4   Alkaline Phos 44 - 121 IU/L 103  87  85   AST 0 - 40 IU/L 35  32  39   ALT 0 - 32 IU/L 46  43  45    Lab Results  Component Value Date   TSH 1.48 10/04/2023   TSH 1.48 10/04/2023   Component Ref Range & Units (hover) 3 d ago (02/10/24) 5 yr ago (03/05/18) 5 yr ago (03/04/18)  Magnesium  2.3 2.0 R, CM 2.0 R, CM    STUDIES:  Final Pathology: 10/31/2023 Breast, Lumpectomy, Left Breast Invasive ductal carcinoma, grade 2, 5mm DCIS with calcifications and comedo necrosis Resolving hematoma with fibrosis PR1 00% ER 80% HER2- negative (0) Ki67 <1% All margins are clear  Pathology: 10/09/2023 Breast, left, needle core biopsy, Anterior extent       - ATYPICAL DUCTAL HYPERPLASIA BORDERING ON DUCTAL CARCINOMA IN  SITU.SEE NOTE.       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          - CALCIFICATIONS ARE PRESENT           - NO MALIGNANCY IDENTIFIED     Allergies:  Allergies  Allergen Reactions   Entresto  [Sacubitril-Valsartan ] Itching   Plavix  [Clopidogrel  Bisulfate] Itching   Codeine Nausea And Vomiting   Oxycodone -Acetaminophen  Other (See Comments)    Hallucinations    Current Medications: Current Outpatient Medications  Medication Sig Dispense Refill   aspirin  EC 81 MG tablet Take 1 tablet (81 mg total) by mouth daily. Swallow whole. 90 tablet 3   atorvastatin  (LIPITOR) 20 MG tablet Take 20 mg by mouth at bedtime.     CALCIUM  CITRATE PO Take 2,000 Units by mouth daily.     Cholecalciferol (VITAMIN D3) 50 MCG (2000 UT) TABS Take 1 tablet by mouth  daily.     citalopram  (CELEXA ) 20 MG tablet Take 20 mg by mouth daily.     clobetasol cream (TEMOVATE) 0.05 % Apply 1 application topically 2 (two) times daily as needed (vaginal irritation).     empagliflozin  (JARDIANCE ) 10 MG TABS tablet Take 1 tablet (10 mg total) by mouth daily before breakfast. 30 tablet 12   furosemide  (LASIX ) 20 MG tablet TAKE 1 TABLET BY MOUTH DAILY. PLEASE KEEP SCHEDULED APPOINTMENT FOR FUTURE REFILLS. THANK YOU. 90 tablet 2   letrozole  (FEMARA ) 2.5 MG tablet Take 1 tablet (2.5 mg total) by mouth daily. 30 tablet 5   meloxicam (MOBIC) 15 MG tablet Take 15 mg by mouth daily.     metoprolol  succinate (TOPROL  XL) 25 MG 24 hr tablet Take 1 tablet (25 mg total) by mouth daily. 90 tablet 3   Multiple Vitamin (MULTIVITAMIN) tablet Take 1 tablet by mouth daily.     nystatin (MYCOSTATIN/NYSTOP) powder Apply topically 2 (two) times daily.     pantoprazole  (PROTONIX ) 40 MG tablet Take 40 mg by mouth daily.     Probiotic Product (PROBIOTIC-10 PO) Take 1 tablet by mouth every  other day.     spironolactone  (ALDACTONE ) 25 MG tablet Take 0.5 tablets (12.5 mg total) by mouth daily. 45 tablet 3   valACYclovir (VALTREX) 1000 MG tablet Take 1,000 mg by mouth daily.     valsartan  (DIOVAN ) 40 MG tablet Take 1 tablet (40 mg total) by mouth 2 (two) times daily. 180 tablet 3   No current facility-administered medications for this visit.   ASSESSMENT & PLAN:  Assessment:   1.  Remote history of stage IA hormone receptor positive breast cancer, diagnosed in July 2009.  She remains without evidence of recurrence.    2.  Osteopenia for which she is taking oral calcium  and vitamin D. Bone density scans are scheduled through Dr. Ina.  I will request her recent bone density scan and plan to repeat it every 2 years.   3.  Stage 1A T1aN0M0 Invasive Ductal Carcinoma diagnosed in March 2025. This was excised in the process of a lumpectomy for atypical ductal hyperplasia and she did have clear margins. This was grade 2 and highly positive for hormone receptors, negative for HER2 (0), with a Ki-67 less than 1%. She did not require radiation or chemotherapy but I did recommend Letrozole  2.5 mg daily for 5 years and that was started in April. She's tolerating it well other than severe hot flashes.   4. 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 The sample included excision of her hematoma.   Plan: She continues letrozole  2.5 mg but experiences hot flashes. We discussed bra prothesis since she has had 2 lumpectomies of the left breast and I gave her a pamphlet on [facility] in [place]. She had labs done on 02/10/2024 and had a elevated creatinine of 1.42 with a BUN of 41 and ALT of 46, the rest of her CMP was normal. She is taking Lasix  20 mg once daily and I advised her to discuss this dosage with her cardiologist. She had an echocardiogram done on 01/23/2024 which revealed an ejection fraction of 45-50% and she has an NM  PET CT cardiac perfusion scheduled also. I will see her back in 3 months for reevaluation. The patient understands the plans discussed today and is in agreement with them.  She knows to contact our office if she develops concerns regarding her breast  cancer.  I provided 16 minutes of face-to-face time during this this encounter and > 50% was spent counseling as documented under my assessment and plan.   Wanda VEAR Cornish, MD [facility] A DE[facility] OF [facility] [address] [place] [address] Dept: [phone] Dept: [facility]: [phone]  No orders of the defined types were placed in this encounter.   I,Jasmine M Lassiter,acting as a scribe for Wanda VEAR Cornish, MD.,have documented all relevant documentation on the behalf of Wanda VEAR Cornish, MD,as directed by  Wanda VEAR Cornish, MD while in the presence of Wanda VEAR Cornish, MD.

## 2024-02-14 ENCOUNTER — Telehealth: Payer: Self-pay | Admitting: Oncology

## 2024-02-14 ENCOUNTER — Other Ambulatory Visit: Payer: Self-pay | Admitting: Cardiology

## 2024-02-14 ENCOUNTER — Encounter: Payer: Self-pay | Admitting: Oncology

## 2024-02-14 ENCOUNTER — Inpatient Hospital Stay: Attending: Oncology | Admitting: Oncology

## 2024-02-14 VITALS — BP 105/65 | HR 65 | Temp 97.9°F | Resp 16 | Ht 65.0 in | Wt 215.2 lb

## 2024-02-14 DIAGNOSIS — C50312 Malignant neoplasm of lower-inner quadrant of left female breast: Secondary | ICD-10-CM

## 2024-02-14 DIAGNOSIS — Z170 Estrogen receptor positive status [ER+]: Secondary | ICD-10-CM | POA: Diagnosis not present

## 2024-02-14 DIAGNOSIS — R7989 Other specified abnormal findings of blood chemistry: Secondary | ICD-10-CM | POA: Diagnosis not present

## 2024-02-14 DIAGNOSIS — R232 Flushing: Secondary | ICD-10-CM | POA: Diagnosis not present

## 2024-02-14 DIAGNOSIS — Z801 Family history of malignant neoplasm of trachea, bronchus and lung: Secondary | ICD-10-CM | POA: Insufficient documentation

## 2024-02-14 DIAGNOSIS — M8580 Other specified disorders of bone density and structure, unspecified site: Secondary | ICD-10-CM | POA: Insufficient documentation

## 2024-02-14 DIAGNOSIS — C50912 Malignant neoplasm of unspecified site of left female breast: Secondary | ICD-10-CM | POA: Diagnosis not present

## 2024-02-14 DIAGNOSIS — Z1721 Progesterone receptor positive status: Secondary | ICD-10-CM | POA: Insufficient documentation

## 2024-02-14 DIAGNOSIS — Z8052 Family history of malignant neoplasm of bladder: Secondary | ICD-10-CM | POA: Diagnosis not present

## 2024-02-14 DIAGNOSIS — Z1732 Human epidermal growth factor receptor 2 negative status: Secondary | ICD-10-CM | POA: Insufficient documentation

## 2024-02-14 DIAGNOSIS — Z803 Family history of malignant neoplasm of breast: Secondary | ICD-10-CM | POA: Insufficient documentation

## 2024-02-14 DIAGNOSIS — Z79811 Long term (current) use of aromatase inhibitors: Secondary | ICD-10-CM | POA: Diagnosis not present

## 2024-02-14 NOTE — Telephone Encounter (Signed)
 Patient has been scheduled for follow-up visit per 02/14/24 LOS.  Pt given an appt calendar with date and time.

## 2024-02-21 DIAGNOSIS — G5600 Carpal tunnel syndrome, unspecified upper limb: Secondary | ICD-10-CM | POA: Diagnosis not present

## 2024-02-23 ENCOUNTER — Ambulatory Visit: Payer: Self-pay | Admitting: Cardiology

## 2024-02-24 NOTE — Progress Notes (Signed)
 Attempted to call the patient. Patient did not answer the phone and her voice mail box is full so unable to leave a message.

## 2024-03-30 DIAGNOSIS — M19072 Primary osteoarthritis, left ankle and foot: Secondary | ICD-10-CM | POA: Diagnosis not present

## 2024-04-06 NOTE — Progress Notes (Unsigned)
 Judith Solis

## 2024-04-07 ENCOUNTER — Ambulatory Visit (INDEPENDENT_AMBULATORY_CARE_PROVIDER_SITE_OTHER): Payer: Medicare Other | Admitting: Neurology

## 2024-04-07 ENCOUNTER — Encounter: Payer: Self-pay | Admitting: Neurology

## 2024-04-07 ENCOUNTER — Telehealth (HOSPITAL_COMMUNITY): Payer: Self-pay | Admitting: *Deleted

## 2024-04-07 VITALS — BP 103/60 | HR 75 | Ht 65.0 in | Wt 214.4 lb

## 2024-04-07 DIAGNOSIS — G4733 Obstructive sleep apnea (adult) (pediatric): Secondary | ICD-10-CM | POA: Diagnosis not present

## 2024-04-07 NOTE — Progress Notes (Signed)
 Patient: Judith Solis Date of Birth: Dec 08, 1945  Reason for Visit: Follow up History from: Patient Primary Neurologist: Buck   ASSESSMENT AND PLAN 78 y.o. year old female   1.  OSA on CPAP ( home sleep test on 12/27/2018 which showed moderate obstructive sleep apnea with an AHI of 18.4/h, O2 nadir 80%. , new CPAP June 2020) - Order HST to qualify for new CPAP machine - Has superb compliance, has excellent benefit with CPAP  2.  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  She is on aspirin  81 mg daily.  We have next follow-up appointment open, will need to see within 3 months of starting new CPAP machine.   HISTORY OF PRESENT ILLNESS: Today 04/07/24 Had lumpectomy to the left breast since last seen. No chemo or radiation. CPAP is doing fine. Mentions has had for 5 years. Still feels tired during the day. Reads the bible, but hard to stay awake. Sleeps 5 hours without waking, usually wakes up once to go to the bathroom. Feels well when initially wakes, feels rested. Doesn't exercise at all. Has bone on bone to left ankle. Uses nasal pillow mask. Remains on aspirin  81 mg daily. Going to have cardiac stress test tomorrow. Has shortness of breath.  CPAP report shows 100% compliance, AHI 5.4, 4-20 cmH2O.  ESS 3.  04/02/23 SS: Last saw Dr. Buck September 2023 for CPAP follow-up also remote history of incidental cerebellar lacunar stroke. Had right shoulder replacement few weeks ago. Continues to use CPAP nightly, we couldn't pull a download today electronically, she didn't bring her machine. Using 6-7 hours nightly. New machine in 2022. Uses nasal pillow mask. Does continue with daytime drowsiness, could nap easily. She continues to do okay off trazodone , gabapentin, Mirapex .  She remains on aspirin  81 mg daily also on Lipitor. BP 138/83, LDL 90 Dec  2023.  She mentions back in January an episode where she was standing reading a product at the drug store, her vision went black for 10-15 seconds, then returned to normal, she had no other symptoms, was not evaluated.  No further spells.  Addendum 04/16/23 SS: Patient brought her CPAP and, download 03/16/2023-04/14/23 shows 100% usage greater than 4 hours.  Average usage 7 hours 21 minutes. 4-20 cm.  AHI 6.0.  Average time in large leak per day 1 minute 34 seconds.  HISTORY  Judith Solis is a 78 year old right-handed woman with an underlying medical history of hyperlipidemia, breast cancer, reflux disease, aortic stenosis with status post TAVR, hypertension, obstructive sleep apnea, obesity, arthritis, status post bilateral hip replacements, status post lumbar laminectomy, who presents for follow-up consultation of her sleep apnea, on AutoPap therapy.  The patient is a lovely today.  I first met her on 03/23/2021 at the request of her primary care physician, at which time the patient reported nonrestorative sleep and tiredness.  She was on several potentially sedating medications and was advised to talk to her primary care about reducing some of her medications such as her trazodone  which was 100 mg at the time.  Her home sleep test through an outside office on 12/27/2018 showed an AHI of 18.4/h, O2 nadir 80%.  She was compliant with her AutoPap therapy at the time. She c/o mental fogginess. We proceeded with a brain MRI. She had a brain MRI with and without contrast on 04/04/2021  and I reviewed the results: Impression: No evidence of acute intracranial abnormality.  Mild to moderate chronic small vessel ischemic changes within the cerebral white matter.  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  Mild generalized cerebral atrophy.   Today, 03/28/2022: I reviewed her AutoPap compliance data from 02/26/2022 through 03/27/2022, which is a total of 30 days, during which time she used her machine every  night with percent use days greater than 4 hours at 96.7%, indicating excellent compliance with an average usage of 6 hours and 56 minutes, residual AHI at goal at 3.4/h, 90th percentile pressure at 8.2 cm, leak on the lower side.  Pressure range of 4 to 20 cm.  She reports overall doing quite well with her machine.  She had a cough recently and her PAP usage was decreased at the time.  She had a cold and cough for most of August.  She ended up with 2 different antibiotics and 2 different inhalers and the steroid inhaler helped her the best she feels.  We talked about her MRI report.  She has no history of strokelike symptoms in the past.  She had a lipid panel on 09/08/2021 which showed a triglyceride level of 275 and LDL of 96.  She takes low-dose atorvastatin , she takes a baby aspirin .  She is on blood pressure medication and blood pressure tends to be good at home.  As far as her mental fogginess, she feels improved, she was successful in coming off of trazodone , gabapentin and pramipexole .  She has had no major repercussions, some residual restless leg symptoms but livable.  REVIEW OF SYSTEMS: Out of a complete 14 system review of symptoms, the patient complains only of the following symptoms, and all other reviewed systems are negative.  See HPI  ALLERGIES: Allergies  Allergen Reactions   Entresto  [Sacubitril-Valsartan ] Itching   Plavix  [Clopidogrel  Bisulfate] Itching   Codeine Nausea And Vomiting   Oxycodone -Acetaminophen  Other (See Comments)    Hallucinations    HOME MEDICATIONS: Outpatient Medications Prior to Visit  Medication Sig Dispense Refill   aspirin  EC 81 MG tablet Take 1 tablet (81 mg total) by mouth daily. Swallow whole. 90 tablet 3   atorvastatin  (LIPITOR) 20 MG tablet Take 20 mg by mouth at bedtime.     CALCIUM  CITRATE PO Take 2,000 Units by mouth daily.     Cholecalciferol (VITAMIN D3) 50 MCG (2000 UT) TABS Take 1 tablet by mouth daily.     citalopram  (CELEXA ) 20 MG tablet  Take 20 mg by mouth daily.     clobetasol cream (TEMOVATE) 0.05 % Apply 1 application topically 2 (two) times daily as needed (vaginal irritation).     empagliflozin  (JARDIANCE ) 10 MG TABS tablet Take 1 tablet (10 mg total) by mouth daily before breakfast. 30 tablet 12   furosemide  (LASIX ) 20 MG tablet TAKE 1 TABLET BY MOUTH DAILY. PLEASE KEEP SCHEDULED APPOINTMENT FOR FUTURE REFILLS. THANK YOU. 90 tablet 2   letrozole  (FEMARA ) 2.5 MG tablet Take 1 tablet (2.5 mg total) by mouth daily. 30 tablet 5   metoprolol  succinate (TOPROL  XL) 25 MG 24 hr tablet Take 1 tablet (25 mg total) by mouth daily. 90 tablet 3   Multiple Vitamin (MULTIVITAMIN) tablet Take 1 tablet by mouth daily.     nystatin (MYCOSTATIN/NYSTOP) powder Apply topically 2 (two) times daily.     pantoprazole  (PROTONIX ) 40 MG tablet Take 40 mg by mouth daily.     Probiotic Product (PROBIOTIC-10 PO) Take 1 tablet by  mouth every other day.     spironolactone  (ALDACTONE ) 25 MG tablet Take 0.5 tablets (12.5 mg total) by mouth daily. 45 tablet 3   valACYclovir (VALTREX) 1000 MG tablet Take 1,000 mg by mouth daily.     valsartan  (DIOVAN ) 40 MG tablet Take 1 tablet (40 mg total) by mouth 2 (two) times daily. 180 tablet 3   meloxicam (MOBIC) 15 MG tablet Take 15 mg by mouth daily. (Patient not taking: Reported on 04/07/2024)     No facility-administered medications prior to visit.    PAST MEDICAL HISTORY: Past Medical History:  Diagnosis Date   Aortic valve regurgitation    Arthritis    Asthma    Breast cancer (HCC)    a. 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   26 mm Edwards Sapien 3 transcatheter heart valve placed via percutaneous right transfemoral approach    Severe aortic  stenosis     PAST SURGICAL HISTORY: Past Surgical History:  Procedure Laterality Date   ABDOMINAL HYSTERECTOMY     BELPHAROPTOSIS REPAIR Bilateral    BREAST BIOPSY Left 10/09/2023   MM LT BREAST BX W LOC DEV EA AD LESION IMG BX SPEC STEREO GUIDE 10/09/2023 [facility]   BREAST BIOPSY Left 10/09/2023   MM LT BREAST BX W LOC DEV 1ST LESION IMAGE BX SPEC STEREO GUIDE 10/09/2023 [facility]   BREAST SURGERY     LUMPECTOMY WITH CHEMO & RADIATION   CATARACT EXTRACTION     EYE SURGERY     INTRAOPERATIVE TRANSTHORACIC ECHOCARDIOGRAM  03/04/2018   Procedure: INTRAOPERATIVE TRANSTHORACIC ECHOCARDIOGRAM;  Surgeon: Wonda Sharper, MD;  Location: [facility] OR;  Service: Open Heart Surgery;;   JOINT REPLACEMENT     BILATERAL HIPS 2007 & 2008   RIGHT/LEFT HEART CATH AND CORONARY ANGIOGRAPHY N/A 01/31/2018   Procedure: RIGHT/LEFT HEART CATH AND CORONARY ANGIOGRAPHY;  Surgeon: Verlin Lonni BIRCH, MD;  Location: [facility];  Service: Cardiovascular;  Laterality: N/A;   ROTATOR CUFF REPAIR     TONSILLECTOMY     TRANSCATHETER AORTIC VALVE REPLACEMENT, TRANSFEMORAL N/A 03/04/2018   Procedure: TRANSCATHETER AORTIC VALVE REPLACEMENT, TRANSFEMORAL. EDWARDS SAPIEN 3 TRANSCATHETER HEART VALVE.;  Surgeon: Wonda Sharper, MD;  Location: [facility] OR;  Service: Open Heart Surgery;  Laterality: N/A;    FAMILY HISTORY: Family History  Problem Relation Age of Onset   Heart disease Mother    Stroke Father    Lung cancer Sister 65       smoking hx   Hepatitis C Sister    Diabetes Mellitus II Sister    Breast cancer Sister 100   Diabetes Mellitus II Sister    Breast cancer Sister 60   Bladder Cancer Sister 65   Heart Problems Sister    Throat cancer Sister    Heart disease Brother    Heart Problems Brother    Heart disease Maternal Grandfather    Diabetes Mellitus II Paternal Grandfather    Lung cancer Paternal Uncle        dx 16s   Breast cancer Cousin        paternal female cousins x2, dx  after 29   Breast cancer Cousin  paternal female cousins, unknown #   Lung cancer Cousin        paternal female cousin   Kidney cancer Niece        dx 15s   Cancer Niece        unknown female cancer; ? cervical; dx 57s   Sleep apnea Neg Hx     SOCIAL HISTORY: Social History   Socioeconomic History   Marital status: Widowed    Spouse name: Not on file   Number of children: 1   Years of education: Not on file   Highest education level: GED or equivalent  Occupational History   Not on file  Tobacco Use   Smoking status: Former    Current packs/day: 0.00    Average packs/day: 1.5 packs/day for 20.0 years (30.0 ttl pk-yrs)    Types: Cigarettes    Start date: 06/04/1970    Quit date: 06/04/1990    Years since quitting: 33.8    Passive exposure: Past   Smokeless tobacco: Never  Vaping Use   Vaping status: Never Used  Substance and Sexual Activity   Alcohol use: No    Comment: none since 1985   Drug use: No   Sexual activity: Not on file  Other Topics Concern   Not on file  Social History Narrative   Lives at home alone   Right handed   Caffeine: 1-2 cups/day of coffee   Social Drivers of Corporate investment banker Strain: Not on file  Food Insecurity: No Food Insecurity (06/28/2021)   Hunger Vital Sign    Worried About Running Out of Food in the Last Year: Never true    Ran Out of Food in the Last Year: Never true  Transportation Needs: No Transportation Needs (06/28/2021)   PRAPARE - Administrator, Civil Service (Medical): No    Lack of Transportation (Non-Medical): No  Physical Activity: Not on file  Stress: Not on file  Social Connections: Not on file  Intimate Partner Violence: Not on file    PHYSICAL EXAM  Vitals:   04/07/24 1045  BP: 103/60  Pulse: 75  Weight: 214 lb 6.4 oz (97.3 kg)  Height: 5' 5 (1.651 m)    Body mass index is 35.68 kg/m.  Generalized: Well developed, in no acute distress  Neurological examination   Mentation: Alert oriented to time, place, history taking. Follows all commands speech and language fluent Cranial nerve II-XII: Pupils were equal round reactive to light. Extraocular movements were full, visual field were full on confrontational test. Facial sensation and strength were normal. Head turning and shoulder shrug  were normal and symmetric. Motor: The motor testing reveals 5 over 5 strength of all 4 extremities. Good symmetric motor tone is noted throughout.  Sensory: Sensory testing is intact to soft touch on all 4 extremities. No evidence of extinction is noted.  Coordination: Cerebellar testing reveals good finger-nose-finger bilaterally Gait and station: Gait is normal, with slight limp on the left from old injury  DIAGNOSTIC DATA (LABS, IMAGING, TESTING) - I reviewed patient records, labs, notes, testing and imaging myself where available.  Lab Results  Component Value Date   WBC 7.0 11/15/2023   HGB 13.5 11/15/2023   HCT 39.8 11/15/2023   MCV 95.4 11/15/2023   PLT 244 11/15/2023      Component Value Date/Time   NA 137 02/10/2024 1022   K 5.1 02/10/2024 1022   CL 99 02/10/2024 1022   CO2 20 02/10/2024 1022  GLUCOSE 117 (H) 02/10/2024 1022   GLUCOSE 110 (H) 11/15/2023 1459   BUN 41 (H) 02/10/2024 1022   CREATININE 1.42 (H) 02/10/2024 1022   CREATININE 0.93 11/15/2023 1459   CALCIUM  10.1 02/10/2024 1022   PROT 7.5 02/10/2024 1022   ALBUMIN 4.6 02/10/2024 1022   AST 35 02/10/2024 1022   AST 32 11/15/2023 1459   ALT 46 (H) 02/10/2024 1022   ALT 43 11/15/2023 1459   ALKPHOS 103 02/10/2024 1022   BILITOT 0.6 02/10/2024 1022   BILITOT 0.4 11/15/2023 1459   GFRNONAA >60 11/15/2023 1459   GFRNONAA 57 10/04/2023 0000   GFRAA 67 09/06/2020 0937   Lab Results  Component Value Date   CHOL 160 10/04/2023   HDL 42 10/04/2023   LDLCALC 89 10/04/2023   TRIG 147 10/04/2023   CHOLHDL 3.7 06/28/2022   Lab Results  Component Value Date   HGBA1C 6.3 (H) 02/27/2018    No results found for: [id number] Lab Results  Component Value Date   TSH 1.48 10/04/2023   TSH 1.48 10/04/2023    Lauraine Born, AGNP-C, DNP 04/07/2024, 11:39 AM [facility] [address] [place], [place] [address] [phone]

## 2024-04-07 NOTE — Telephone Encounter (Signed)
 Reaching out to patient to offer assistance regarding upcoming cardiac imaging study; pt verbalizes understanding of appt date/time, parking situation and where to check in, pre-test NPO status and medications ordered, and verified current allergies; name and call back number provided for further questions should they arise Judith Frame RN Navigator Cardiac Imaging [facility] [phone] office [phone] cell  Patient aware to avoid caffeine for 12 hours prior to test.

## 2024-04-07 NOTE — Patient Instructions (Signed)
 Order home sleep study to qualify for a new CPAP machine

## 2024-04-07 NOTE — Telephone Encounter (Signed)
 Attempted to call patient regarding upcoming cardiac PET appointment. Left message on voicemail with name and callback number Johney Frame RN Navigator Cardiac Imaging [facility] [phone] Office  Advised to avoid caffeine for 12 hours prior to test.

## 2024-04-07 NOTE — Progress Notes (Signed)
 Judith Solis

## 2024-04-08 ENCOUNTER — Ambulatory Visit (HOSPITAL_COMMUNITY)
Admission: RE | Admit: 2024-04-08 | Discharge: 2024-04-08 | Disposition: A | Discharge status: Home / Self Care | Source: Ambulatory Visit

## 2024-04-08 DIAGNOSIS — I429 Cardiomyopathy, unspecified: Secondary | ICD-10-CM | POA: Diagnosis not present

## 2024-04-08 DIAGNOSIS — I447 Left bundle-branch block, unspecified: Secondary | ICD-10-CM | POA: Diagnosis not present

## 2024-04-08 LAB — NM PET CT CARDIAC PERFUSION MULTI W/ABSOLUTE BLOODFLOW
LV dias vol: 105 mL (ref 46–106)
LV sys vol: 49 mL (ref 3.8–5.2)
MBFR: 1.9
Nuc Rest EF: 53 %
Nuc Stress EF: 55 %
Peak HR: 76 {beats}/min
Rest HR: 67 {beats}/min
Rest MBF: 0.81 ml/g/min
Rest Nuclear Isotope Dose: 25.1 mCi
SDS: 6
SRS: 0
SSS: 6
ST Depression (mm): 0 mm
Stress MBF: 1.54 ml/g/min
Stress Nuclear Isotope Dose: 25.2 mCi

## 2024-04-08 MED ORDER — RUBIDIUM RB82 GENERATOR (RUBYFILL)
25.2400 | PACK | Freq: Once | INTRAVENOUS | Status: AC
  Administered 2024-04-08: 25.24 via INTRAVENOUS

## 2024-04-08 MED ORDER — REGADENOSON 0.4 MG/5ML IV SOLN
0.4000 mg | Freq: Once | INTRAVENOUS | Status: AC
  Administered 2024-04-08: 0.4 mg via INTRAVENOUS

## 2024-04-08 MED ORDER — REGADENOSON 0.4 MG/5ML IV SOLN
INTRAVENOUS | Status: AC
  Filled 2024-04-08: qty 5

## 2024-04-08 MED ORDER — RUBIDIUM RB82 GENERATOR (RUBYFILL)
25.1100 | PACK | Freq: Once | INTRAVENOUS | Status: AC
Start: 2024-04-08 — End: 2024-04-08
  Administered 2024-04-08: 25.11 via INTRAVENOUS

## 2024-04-09 ENCOUNTER — Ambulatory Visit: Payer: Self-pay

## 2024-04-09 DIAGNOSIS — R9439 Abnormal result of other cardiovascular function study: Secondary | ICD-10-CM

## 2024-04-09 DIAGNOSIS — R0981 Nasal congestion: Secondary | ICD-10-CM | POA: Diagnosis not present

## 2024-04-09 HISTORY — DX: Abnormal result of other cardiovascular function study: R94.39

## 2024-04-30 ENCOUNTER — Other Ambulatory Visit: Payer: Self-pay

## 2024-05-03 ENCOUNTER — Other Ambulatory Visit: Payer: Self-pay | Admitting: Oncology

## 2024-05-03 DIAGNOSIS — N6092 Unspecified benign mammary dysplasia of left breast: Secondary | ICD-10-CM

## 2024-05-04 ENCOUNTER — Telehealth: Payer: Self-pay | Admitting: Neurology

## 2024-05-04 NOTE — Telephone Encounter (Signed)
 I spoke with the patient.She is scheduled at [facility] for 05/20/24 at 1 pm.  Mailed packet to the patient.

## 2024-05-05 ENCOUNTER — Ambulatory Visit

## 2024-05-05 VITALS — BP 123/76 | HR 68 | Ht 65.0 in | Wt 212.6 lb

## 2024-05-05 DIAGNOSIS — Z952 Presence of prosthetic heart valve: Secondary | ICD-10-CM | POA: Insufficient documentation

## 2024-05-05 DIAGNOSIS — I10 Essential (primary) hypertension: Secondary | ICD-10-CM | POA: Insufficient documentation

## 2024-05-05 DIAGNOSIS — I5042 Chronic combined systolic (congestive) and diastolic (congestive) heart failure: Secondary | ICD-10-CM | POA: Diagnosis not present

## 2024-05-05 DIAGNOSIS — R9439 Abnormal result of other cardiovascular function study: Secondary | ICD-10-CM | POA: Diagnosis not present

## 2024-05-05 DIAGNOSIS — E782 Mixed hyperlipidemia: Secondary | ICD-10-CM | POA: Insufficient documentation

## 2024-05-05 NOTE — Patient Instructions (Signed)
 Medication Instructions:  Your physician recommends that you continue on your current medications as directed. Please refer to the Current Medication list given to you today.   *If you need a refill on your cardiac medications before your next appointment, please call your pharmacy*   Lab Work: Your physician recommends that you have a CMP, CBC, magnesium  and lipids today in the office for your upcoming procedure.  If you have labs (blood work) drawn today and your tests are completely normal, you will receive your results only by: MyChart Message (if you have MyChart) OR A paper copy in the mail If you have any lab test that is abnormal or we need to change your treatment, we will call you to review the results.   Testing/Procedures:  [place] [facility] DEPT OF [place]. [facility] [place] [address] [place] [address] Dept: [phone] Loc: [phone]  Jandi Swiger  05/05/2024  You are scheduled for a Cardiac Catheterization on Monday, October 20 with Dr. Lurena Red.  1. Please arrive at the [facility] (Main Entrance A) at [name]: [address] [place], [place] [address] at 7:00 AM (This time is 2 hour(s) before your procedure to ensure your preparation).   Free valet parking service is available. You will check in at ADMITTING. The support person will be asked to wait in the waiting room.  It is OK to have someone drop you off and come back when you are ready to be discharged.    Special note: Every effort is made to have your procedure done on time. Please understand that emergencies sometimes delay scheduled procedures.  2. Diet: Nothing to eat after midnight.   3. Hydration: You need to be well hydrated before your procedure. On October 20, you may drink approved liquids (see below) until 2 hours before the procedure, with 16 oz of water as your last intake.   List of approved liquids water, clear  juice, clear tea, black coffee, fruit juices, non-citric and without pulp, carbonated beverages, Gatorade, Kool -Aid, plain Jello-O and plain ice popsicles.  4. Labs: You had your labs done in the office.  5. Medication instructions in preparation for your procedure:   Contrast Allergy: No  Stop taking, Diovan  (Valsartan ) Monday, October 20,, Lasix  (Furosemide )  Monday, October 20,, Jardiance  Friday, October 17,  On the morning of your procedure, take your Aspirin  81 mg and any morning medicines NOT listed above.  You may use sips of water.  6. Plan to go home the same day, you will only stay overnight if medically necessary. 7. Bring a current list of your medications and current insurance cards. 8. You MUST have a responsible person to drive you home. 9. Someone MUST be with you the first 24 hours after you arrive home or your discharge will be delayed. 10. Please wear clothes that are easy to get on and off and wear slip-on shoes.  Thank you for allowing us  to care for you!   -- [facility]    Follow-Up: At [facility], you and your health needs are our priority.  As part of our continuing mission to provide you with exceptional heart care, we have created designated Provider Care Teams.  These Care Teams include your primary Cardiologist (physician) and Advanced Practice Providers (APPs -  Physician Assistants and Nurse Practitioners) who all work together to provide you with the care you need, when you need it.  We recommend signing up for the patient portal called MyChart.  Sign up information is provided on this After Visit Summary.  MyChart is used to connect with patients for Virtual Visits (Telemedicine).  Patients are able to view lab/test results, encounter notes, upcoming appointments, etc.  Non-urgent messages can be sent to your provider as well.   To learn more about what you can do with MyChart, go to [email or web address].    Your  next appointment:   1 month(s)  The format for your next appointment:   In Person  Provider:   Alean Kobus, MD   Other Instructions  Coronary Angiogram With Stent Coronary angiogram with stent placement is a procedure to widen or open a narrow blood vessel of the heart (coronary artery). Arteries may become blocked by cholesterol buildup (plaques) in the lining of the artery wall. When a coronary artery becomes partially blocked, blood flow to that area decreases. This may lead to chest pain or a heart attack (myocardial infarction). A stent is a small piece of metal that looks like mesh or spring. Stent placement may be done as treatment after a heart attack, or to prevent a heart attack if a blocked artery is found by a coronary angiogram. Let your health care provider know about: Any allergies you have, including allergies to medicines or contrast dye. All medicines you are taking, including vitamins, herbs, eye drops, creams, and over-the-counter medicines. Any problems you or family members have had with anesthetic medicines. Any blood disorders you have. Any surgeries you have had. Any medical conditions you have, including kidney problems or kidney failure. Whether you are pregnant or may be pregnant. Whether you are breastfeeding. What are the risks? Generally, this is a safe procedure. However, serious problems may occur, including: Damage to nearby structures or organs, such as the heart, blood vessels, or kidneys. A return of blockage. Bleeding, infection, or bruising at the insertion site. A collection of blood under the skin (hematoma) at the insertion site. A blood clot in another part of the body. Allergic reaction to medicines or dyes. Bleeding into the abdomen (retroperitoneal bleeding). Stroke (rare). Heart attack (rare). What happens before the procedure? Staying hydrated Follow instructions from your health care provider about hydration, which may  include: Up to 2 hours before the procedure - you may continue to drink clear liquids, such as water, clear fruit juice, black coffee, and plain tea.    Eating and drinking restrictions Follow instructions from your health care provider about eating and drinking, which may include: 8 hours before the procedure - stop eating heavy meals or foods, such as meat, fried foods, or fatty foods. 6 hours before the procedure - stop eating light meals or foods, such as toast or cereal. 2 hours before the procedure - stop drinking clear liquids. Medicines Ask your health care provider about: Changing or stopping your regular medicines. This is especially important if you are taking diabetes medicines or blood thinners. Taking medicines such as aspirin  and ibuprofen. These medicines can thin your blood. Do not take these medicines unless your health care provider tells you to take them. Generally, aspirin  is recommended before a thin tube, called a catheter, is passed through a blood vessel and inserted into the heart (cardiac catheterization). Taking over-the-counter medicines, vitamins, herbs, and supplements. General instructions Do not use any products that contain nicotine or tobacco for at least 4 weeks before the procedure. These products include cigarettes, e-cigarettes, and chewing tobacco. If you need help quitting,  ask your health care provider. Plan to have someone take you home from the hospital or clinic. If you will be going home right after the procedure, plan to have someone with you for 24 hours. You may have tests and imaging procedures. Ask your health care provider: How your insertion site will be marked. Ask which artery will be used for the procedure. What steps will be taken to help prevent infection. These may include: Removing hair at the insertion site. Washing skin with a germ-killing soap. Taking antibiotic medicine. What happens during the procedure? An IV will be inserted  into one of your veins. Electrodes may be placed on your chest to monitor your heart rate during the procedure. You will be given one or more of the following: A medicine to help you relax (sedative). A medicine to numb the area (local anesthetic) for catheter insertion. A small incision will be made for catheter insertion. The catheter will be inserted into an artery using a guide wire. The location may be in your groin, your wrist, or the fold of your arm (near your elbow). An X-ray procedure (fluoroscopy) will be used to help guide the catheter to the opening of the heart arteries. A dye will be injected into the catheter. X-rays will be taken. The dye helps to show where any narrowing or blockages are located in the arteries. Tell your health care provider if you have chest pain or trouble breathing. A tiny wire will be guided to the blocked spot, and a balloon will be inflated to make the artery wider. The stent will be expanded to crush the plaques into the wall of the vessel. The stent will hold the area open and improve the blood flow. Most stents have a drug coating to reduce the risk of the stent narrowing over time. The artery may be made wider using a drill, laser, or other tools that remove plaques. The catheter will be removed when the blood flow improves. The stent will stay where it was placed, and the lining of the artery will grow over it. A bandage (dressing) will be placed on the insertion site. Pressure will be applied to stop bleeding. The IV will be removed. This procedure may vary among health care providers and hospitals.    What happens after the procedure? Your blood pressure, heart rate, breathing rate, and blood oxygen level will be monitored until you leave the hospital or clinic. If the procedure is done through the leg, you will lie flat in bed for a few hours or for as long as told by your health care provider. You will be instructed not to bend or cross your  legs. The insertion site and the pulse in your foot or wrist will be checked often. You may have more blood tests, X-rays, and a test that records the electrical activity of your heart (electrocardiogram, or ECG). Do not drive for 24 hours if you were given a sedative during your procedure. Summary Coronary angiogram with stent placement is a procedure to widen or open a narrowed coronary artery. This is done to treat heart problems. Before the procedure, let your health care provider know about all the medical conditions and surgeries you have or have had. This is a safe procedure. However, some problems may occur, including damage to nearby structures or organs, bleeding, blood clots, or allergies. Follow your health care provider's instructions about eating, drinking, medicines, and other lifestyle changes, such as quitting tobacco use before the procedure. This information  is not intended to replace advice given to you by your health care provider. Make sure you discuss any questions you have with your health care provider. Document Revised: 01/28/2019 Document Reviewed: 01/28/2019 Elsevier Patient Education  2021 [employer]  Medicines Take over-the-counter and prescription medicines only as told by your health care provider. If you are taking blood thinners: Talk with your health care provider before you take any medicines that contain aspirin  or NSAIDs, such as ibuprofen. These medicines increase your risk for dangerous bleeding. Take your medicine exactly as told, at the same time every day. Avoid activities that could cause injury or bruising, and follow instructions about how to prevent falls. Wear a medical alert bracelet or carry a card that lists what medicines you take. General instructions Do not drink alcohol if: Your health care provider tells you not to drink. You are pregnant, may be pregnant, or are planning to become pregnant. If you drink alcohol: Limit how much you  use to: 0-1 drink a day for women. 0-2 drinks a day for men. Be aware of how much alcohol is in your drink. In the [place] one drink equals one 12 oz bottle of beer (355 mL), one 5 oz glass of wine (148 mL), or one 1 oz glass of hard liquor (44 mL). Keep all follow-up visits as told by your health care provider. This is important. Where to find more information The American Heart Association: www.heart.org Contact a health care provider if you have: Unusual bleeding or bruising. Stomach pain or nausea. Ringing in your ears. An allergic reaction that causes hives, itchy skin, or swelling of the lips, tongue, or face. Get help right away if: You notice that your bowel movements are bloody, or dark red or black in color. You vomit or cough up blood. You have blood in your urine. You cough, breathe loudly (wheeze), or feel short of breath. You have chest pain, especially if the pain spreads to your arms, back, neck, or jaw. You have a headache with confusion. You have any symptoms of a stroke. BE FAST is an easy way to remember the main warning signs of a stroke: B - Balance. Signs are dizziness, sudden trouble walking, or loss of balance. E - Eyes. Signs are trouble seeing or a sudden change in vision. F - Face. Signs are sudden weakness or numbness of the face, or the face or eyelid drooping on one side. A - Arms. Signs are weakness or numbness in an arm. This happens suddenly and usually on one side of the body. S - Speech. Signs are sudden trouble speaking, slurred speech, or trouble understanding what people say. T - Time. Time to call emergency services. Write down what time symptoms started. You have other signs of a stroke, such as: A sudden, severe headache with no known cause. Nausea or vomiting. Seizure. These symptoms may represent a serious problem that is an emergency. Do not wait to see if the symptoms will go away. Get medical help right away. Call your local emergency  services (911 in the [place] Do not drive yourself to the hospital. Summary Aspirin  use can help reduce the risk of blood clots, heart attacks, and other heart-related problems. Daily use of aspirin  can cause side effects. Take aspirin  only as told by your health care provider. Make sure that you understand how much to take and what form to take. Your health care provider will help you determine whether it is safe and beneficial for you to  take aspirin  daily. This information is not intended to replace advice given to you by your health care provider. Make sure you discuss any questions you have with your health care provider. Document Revised: 04/13/2019 Document Reviewed: 04/13/2019 Elsevier Patient Education  2021 [employer].

## 2024-05-05 NOTE — Assessment & Plan Note (Signed)
 Well-functioning valve on last echocardiogram June 2025. Continue to follow-up with annual echocardiograms. Aware to remain on antibiotic for endocarditis prophylaxis prophylaxis before any surgical procedures.

## 2024-05-05 NOTE — H&P (View-Only) (Signed)
 Cardiology Consultation:    Date:  05/05/2024   ID:  Justice Aguirre, DOB April 02, 1946, MRN [id number]  PCP:  Ina Marcellus RAMAN, MD  Cardiologist:  Alean SAUNDERS Aundray Cartlidge, MD   Referring MD: Ina Marcellus RAMAN, MD   No chief complaint on file.    ASSESSMENT AND PLAN:   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 Cornelius. Last echocardiogram to review is from 01/16/2024 with LVEF 45 to 50% mild MR, mild mitral stenosis mean gradient 3.5 mmHg at heart rate 72/min, well-seated bioprosthetic aortic valve with mean gradient 11 mmHg and dimensionless index 0.44.   Problem List Items Addressed This Visit     Hyperlipidemia   Suboptimal. Continue rosuvastatin 20 mg once daily. Will repeat lipid panel.       Relevant Orders   Lipid panel   Essential hypertension   Well-controlled. Target below 130/80 mmHg. Continue current guideline directed medical therapy she is on heart failure.       Relevant Orders   CBC with Differential/Platelet   Comprehensive metabolic panel with GFR   Magnesium    S/P TAVR (transcatheter aortic valve replacement)   Well-functioning valve on last echocardiogram June 2025. Continue to follow-up with annual echocardiograms. Aware to remain on antibiotic for endocarditis prophylaxis prophylaxis before any surgical procedures.      Heart failure (HCC)    mildly reduced LVEF, 45 to 50% by echocardiogram June 2025 Started to decline since April 2022 and echocardiogram with EF 40 to  45%.   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  Now with cardiac PET stress test 04/08/2024 showing ischemia in LCx territory.  Ischemic evaluation for cardiomyopathy reviewed and to proceed with cardiac cath.  Appears euvolemic and compensated. Weight has decreased to 2212 pounds in comparison to 217 pounds at last visit. Continue Lasix  20 mg once daily  Guideline directed medical therapy Continue her current dose of valsartan  40 mg twice daily Did not tolerate Entresto  in the past due to itching Tolerating metoprolol  succinate 25 mg once daily [started last visit] Continue spironolactone  12.5 mg once daily [started last visit] Continue Jardiance  10 mg once daily that was started at last visit.  Follow-up blood work today CMP, magnesium       Abnormal cardiovascular stress test Abnormal cardiac PET stress test 04/08/2024, intermediate risk study, small area of ischemia in LCx territory [apical to mid inferolateral location]. - Primary   Discussed the findings with her extensively. Recommended to proceed with cardiac cath for further ischemic evaluation.  She is agreeable. Discussed the procedure at length. Shared Decision Making/Informed Consent{ The risks [stroke (1 in 1000), death (1 in 1000), kidney failure [usually temporary] (1 in 500), bleeding (1 in 200), allergic reaction [possibly serious] (1 in 200)], benefits (diagnostic support and management of coronary artery disease) and alternatives of a cardiac catheterization were discussed in detail with her and she  is willing to proceed.  Will obtain precath blood work today to assess renal function and electrolytes.        Relevant Orders   EKG 12-Lead (Completed)   Return to clinic in 4 to 6  weeks.   History of Present Illness:    Theia Dezeeuw is a 78 y.o. female who is being seen today for follow-up visit. PCP is Ina Marcellus RAMAN, MD. Last visit with me in the office was 01/23/2024.  Pleasant woman.  Lives by herself at home.  Her daughter who lives nearby is available to help as needed.  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 Cornelius. Last echocardiogram to review is from 01/16/2024 with LVEF 45 to 50% mild MR, mild mitral stenosis mean gradient 3.5 mmHg at heart rate 72/min, well-seated bioprosthetic aortic valve with mean gradient 11 mmHg and dimensionless index 0.44.  At last office visit discussed her functional capacity gradually decreasing over the last couple years along with the increased shortness of breath with day-to-day activities.  Further evaluated with cardiac PET stress 04/08/2024 that reported ischemia, intermediate risk study, but small reversible defect in apical to mid inferolateral segments suggestive of ischemia in LCx territory.  No significant extracardiac findings.  Here for the visit today by herself. Mentions continues to feel short of breath and tired with day-to-day activities. Over the past month has lost about 4 to 5 pounds but this has not made much difference in terms of her symptoms. Denies any symptoms of chest pain or palpitations or shortness of breath at rest. No pedal edema.  Mentions recently was traveling to [place] with her family and on 1 instance that she bent down to plug in  her CPAP device she felt lightheaded and fell backwards bruising her on the back of the head.  No further recurrence.  Blood work from 02/10/2024 shows NT-proBNP 162 Magnesium  2.3 CMP with BUN 41, creatinine 1.42, eGFR 37 Sodium 137 and potassium 5.1 Mildly elevated ALT 46 which has been chronic.  Normal AST and alkaline phosphatase.   Past Medical History:  Diagnosis Date   Aortic valve regurgitation    Arthritis    Asthma    Breast cancer (HCC)    a. 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   26 mm Edwards Sapien 3 transcatheter heart valve placed via percutaneous right transfemoral approach    Severe aortic stenosis     Past Surgical History:  Procedure Laterality Date   ABDOMINAL HYSTERECTOMY     BELPHAROPTOSIS REPAIR Bilateral    BREAST BIOPSY Left 10/09/2023   MM LT BREAST BX W LOC DEV EA AD LESION IMG BX SPEC STEREO GUIDE 10/09/2023 [facility]   BREAST BIOPSY Left 10/09/2023   MM LT BREAST BX  W LOC DEV 1ST LESION IMAGE BX SPEC STEREO GUIDE 10/09/2023 [facility]   BREAST SURGERY     LUMPECTOMY WITH CHEMO & RADIATION   CATARACT EXTRACTION     EYE SURGERY     INTRAOPERATIVE TRANSTHORACIC ECHOCARDIOGRAM  03/04/2018   Procedure: INTRAOPERATIVE TRANSTHORACIC ECHOCARDIOGRAM;  Surgeon: Wonda Sharper, MD;  Location: [facility] OR;  Service: Open Heart Surgery;;   JOINT REPLACEMENT     BILATERAL HIPS 2007 & 2008   RIGHT/LEFT HEART CATH AND CORONARY ANGIOGRAPHY N/A 01/31/2018   Procedure: RIGHT/LEFT HEART CATH AND CORONARY ANGIOGRAPHY;  Surgeon: Verlin Lonni BIRCH, MD;  Location: [facility]VAS[facility];  Service: Cardiovascular;  Laterality: N/A;   ROTATOR CUFF REPAIR     TONSILLECTOMY     TRANSCATHETER AORTIC VALVE  REPLACEMENT, TRANSFEMORAL N/A 03/04/2018   Procedure: TRANSCATHETER AORTIC VALVE REPLACEMENT, TRANSFEMORAL. EDWARDS SAPIEN 3 TRANSCATHETER HEART VALVE.;  Surgeon: Wonda Sharper, MD;  Location: [facility] OR;  Service: Open Heart Surgery;  Laterality: N/A;    Current Medications: Current Meds  Medication Sig   aspirin  EC 81 MG tablet Take 1 tablet (81 mg total) by mouth daily. Swallow whole.   atorvastatin  (LIPITOR) 20 MG tablet Take 20 mg by mouth at bedtime.   budesonide-formoterol (SYMBICORT) 160-4.5 MCG/ACT inhaler Inhale 2 puffs into the lungs.   CALCIUM  CITRATE PO Take 2,000 Units by mouth daily.   Cholecalciferol (VITAMIN D3) 50 MCG (2000 UT) TABS Take 1 tablet by mouth daily.   citalopram  (CELEXA ) 20 MG tablet Take 20 mg by mouth daily.   clobetasol cream (TEMOVATE) 0.05 % Apply 1 application topically 2 (two) times daily as needed (vaginal irritation).   empagliflozin  (JARDIANCE ) 10 MG TABS tablet Take 1 tablet (10 mg total) by mouth daily before breakfast.   furosemide  (LASIX ) 20 MG tablet TAKE 1 TABLET BY MOUTH DAILY. PLEASE KEEP SCHEDULED APPOINTMENT FOR FUTURE REFILLS. THANK YOU.   letrozole  (FEMARA ) 2.5 MG tablet TAKE 1 TABLET BY MOUTH EVERY DAY   metoprolol  succinate (TOPROL  XL) 25 MG 24 hr tablet Take 1 tablet (25 mg total) by mouth daily.   Multiple Vitamin (MULTIVITAMIN) tablet Take 1 tablet by mouth daily.   nystatin (MYCOSTATIN/NYSTOP) powder Apply topically 2 (two) times daily.   pantoprazole  (PROTONIX ) 40 MG tablet Take 40 mg by mouth daily.   Probiotic Product (PROBIOTIC-10 PO) Take 1 tablet by mouth every other day.   spironolactone  (ALDACTONE ) 25 MG tablet Take 0.5 tablets (12.5 mg total) by mouth daily.   valACYclovir (VALTREX) 1000 MG tablet Take 1,000 mg by mouth daily.   valsartan  (DIOVAN ) 40 MG tablet Take 1 tablet (40 mg total) by mouth 2 (two) times daily.     Allergies:   Entresto  [sacubitril-valsartan ], Plavix  [clopidogrel  bisulfate], Codeine, and  Oxycodone -acetaminophen    Social History   Socioeconomic History   Marital status: Widowed    Spouse name: Not on file   Number of children: 1   Years of education: Not on file   Highest education level: GED or equivalent  Occupational History   Not on file  Tobacco Use   Smoking status: Former    Current packs/day: 0.00    Average packs/day: 1.5 packs/day for 20.0 years (30.0 ttl pk-yrs)    Types: Cigarettes    Start date: 06/04/1970    Quit date: 06/04/1990    Years since quitting: 33.9    Passive exposure: Past   Smokeless tobacco: Never  Vaping Use   Vaping status: Never Used  Substance and Sexual Activity  Alcohol use: No    Comment: none since 1985   Drug use: No   Sexual activity: Not on file  Other Topics Concern   Not on file  Social History Narrative   Lives at home alone   Right handed   Caffeine: 1-2 cups/day of coffee   Social Drivers of Corporate investment banker Strain: Not on file  Food Insecurity: No Food Insecurity (06/28/2021)   Hunger Vital Sign    Worried About Running Out of Food in the Last Year: Never true    Ran Out of Food in the Last Year: Never true  Transportation Needs: No Transportation Needs (06/28/2021)   PRAPARE - Administrator, Civil Service (Medical): No    Lack of Transportation (Non-Medical): No  Physical Activity: Not on file  Stress: Not on file  Social Connections: Not on file     Family History: The patient's family history includes Bladder Cancer (age of onset: 50) in her sister; Breast cancer in her cousin and cousin; Breast cancer (age of onset: 10) in her sister; Breast cancer (age of onset: 54) in her sister; Cancer in her niece; Diabetes Mellitus II in her paternal grandfather, sister, and sister; Heart Problems in her brother and sister; Heart disease in her brother, maternal grandfather, and mother; Hepatitis C in her sister; Kidney cancer in her niece; Lung cancer in her cousin and paternal uncle; Lung  cancer (age of onset: 44) in her sister; Stroke in her father; Throat cancer in her sister. There is no history of Sleep apnea. ROS:   Please see the history of present illness.    All 14 point review of systems negative except as described per history of present illness.  EKGs/Labs/Other Studies Reviewed:    The following studies were reviewed today:   EKG:       Recent Labs: 10/04/2023: TSH 1.48; TSH 1.48 11/15/2023: Hemoglobin 13.5; Platelet Count 244 02/10/2024: ALT 46; BUN 41; Creatinine, Ser 1.42; Magnesium  2.3; NT-Pro BNP 162; Potassium 5.1; Sodium 137  Recent Lipid Panel    Component Value Date/Time   CHOL 160 10/04/2023 0000   CHOL 161 06/28/2022 0933   TRIG 147 10/04/2023 0000   HDL 42 10/04/2023 0000   HDL 43 06/28/2022 0933   CHOLHDL 3.7 06/28/2022 0933   LDLCALC 89 10/04/2023 0000   LDLCALC 90 06/28/2022 0933    Physical Exam:    VS:  BP 123/76   Pulse 68   Ht 5' 5 (1.651 m)   Wt 212 lb 9.6 oz (96.4 kg)   SpO2 93%   BMI 35.38 kg/m     Wt Readings from Last 3 Encounters:  05/05/24 212 lb 9.6 oz (96.4 kg)  04/07/24 214 lb 6.4 oz (97.3 kg)  02/14/24 215 lb 3.2 oz (97.6 kg)     GENERAL:  Well nourished, well developed in no acute distress NECK: No JVD; No carotid bruits CARDIAC: RRR, S1 and S2 present, 3/6 ejection systolic murmur CHEST:  Clear to auscultation without rales, wheezing or rhonchi  Extremities: No pitting pedal edema. Pulses bilaterally symmetric with radial 2+ and dorsalis pedis 2+ NEUROLOGIC:  Alert and oriented x 3  Medication Adjustments/Labs and Tests Ordered: Current medicines are reviewed at length with the patient today.  Concerns regarding medicines are outlined above.  Orders Placed This Encounter  Procedures   CBC with Differential/Platelet   Comprehensive metabolic panel with GFR   Magnesium    Lipid panel   EKG 12-Lead  No orders of the defined types were placed in this encounter.   Signed, Alean jess Kobus,  MD, MPH, [facility]. 05/05/2024 12:10 PM     [facility]

## 2024-05-05 NOTE — Assessment & Plan Note (Signed)
 Suboptimal. Continue rosuvastatin 20 mg once daily. Will repeat lipid panel.

## 2024-05-05 NOTE — Assessment & Plan Note (Signed)
 Well-controlled. Target below 130/80 mmHg. Continue current guideline directed medical therapy she is on heart failure.

## 2024-05-05 NOTE — Assessment & Plan Note (Signed)
 Discussed the findings with her extensively. Recommended to proceed with cardiac cath for further ischemic evaluation.  She is agreeable. Discussed the procedure at length. Shared Decision Making/Informed Consent{ The risks [stroke (1 in 1000), death (1 in 1000), kidney failure [usually temporary] (1 in 500), bleeding (1 in 200), allergic reaction [possibly serious] (1 in 200)], benefits (diagnostic support and management of coronary artery disease) and alternatives of a cardiac catheterization were discussed in detail with her and she is willing to proceed.  Will obtain precath blood work today to assess renal function and electrolytes.

## 2024-05-05 NOTE — Progress Notes (Signed)
 Cardiology Consultation:    Date:  05/05/2024   ID:  Judith Solis, DOB April 02, 1946, MRN [id number]  PCP:  Judith Marcellus RAMAN, MD  Cardiologist:  Judith SAUNDERS Aundray Cartlidge, MD   Referring MD: Judith Marcellus RAMAN, MD   No chief complaint on file.    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 Cornelius. Last echocardiogram to review is from 01/16/2024 with LVEF 45 to 50% mild MR, mild mitral stenosis mean gradient 3.5 mmHg at heart rate 72/min, well-seated bioprosthetic aortic valve with mean gradient 11 mmHg and dimensionless index 0.44.   Problem List Items Addressed This Visit     Hyperlipidemia   Suboptimal. Continue rosuvastatin 20 mg once daily. Will repeat lipid panel.       Relevant Orders   Lipid panel   Essential hypertension   Well-controlled. Target below 130/80 mmHg. Continue current guideline directed medical therapy she is on heart failure.       Relevant Orders   CBC with Differential/Platelet   Comprehensive metabolic panel with GFR   Magnesium    S/P TAVR (transcatheter aortic valve replacement)   Well-functioning valve on last echocardiogram June 2025. Continue to follow-up with annual echocardiograms. Aware to remain on antibiotic for endocarditis prophylaxis prophylaxis before any surgical procedures.      Heart failure (HCC)    mildly reduced LVEF, 45 to 50% by echocardiogram June 2025 Started to decline since April 2022 and echocardiogram with EF 40 to  45%.   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  Now with cardiac PET stress test 04/08/2024 showing ischemia in LCx territory.  Ischemic evaluation for cardiomyopathy reviewed and to proceed with cardiac cath.  Appears euvolemic and compensated. Weight has decreased to 2212 pounds in comparison to 217 pounds at last visit. Continue Lasix  20 mg once daily  Guideline directed medical therapy Continue her current dose of valsartan  40 mg twice daily Did not tolerate Entresto  in the past due to itching Tolerating metoprolol  succinate 25 mg once daily [started last visit] Continue spironolactone  12.5 mg once daily [started last visit] Continue Jardiance  10 mg once daily that was started at last visit.  Follow-up blood work today CMP, magnesium       Abnormal cardiovascular stress test Abnormal cardiac PET stress test 04/08/2024, intermediate risk study, small area of ischemia in LCx territory [apical to mid inferolateral location]. - Primary   Discussed the findings with her extensively. Recommended to proceed with cardiac cath for further ischemic evaluation.  She is agreeable. Discussed the procedure at length. Shared Decision Making/Informed Consent{ The risks [stroke (1 in 1000), death (1 in 1000), kidney failure [usually temporary] (1 in 500), bleeding (1 in 200), allergic reaction [possibly serious] (1 in 200)], benefits (diagnostic support and management of coronary artery disease) and alternatives of a cardiac catheterization were discussed in detail with her and she  is willing to proceed.  Will obtain precath blood work today to assess renal function and electrolytes.        Relevant Orders   EKG 12-Lead (Completed)   Return to clinic in 4 to 6  weeks.   History of Present Illness:    Judith Solis is a 78 y.o. female who is being seen today for follow-up visit. PCP is Judith Marcellus RAMAN, MD. Last visit with me in the office was 01/23/2024.  Pleasant woman.  Lives by herself at home.  Her daughter who lives nearby is available to help as needed.  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 Cornelius. Last echocardiogram to review is from 01/16/2024 with LVEF 45 to 50% mild MR, mild mitral stenosis mean gradient 3.5 mmHg at heart rate 72/min, well-seated bioprosthetic aortic valve with mean gradient 11 mmHg and dimensionless index 0.44.  At last office visit discussed her functional capacity gradually decreasing over the last couple years along with the increased shortness of breath with day-to-day activities.  Further evaluated with cardiac PET stress 04/08/2024 that reported ischemia, intermediate risk study, but small reversible defect in apical to mid inferolateral segments suggestive of ischemia in LCx territory.  No significant extracardiac findings.  Here for the visit today by herself. Mentions continues to feel short of breath and tired with day-to-day activities. Over the past month has lost about 4 to 5 pounds but this has not made much difference in terms of her symptoms. Denies any symptoms of chest pain or palpitations or shortness of breath at rest. No pedal edema.  Mentions recently was traveling to [place] with her family and on 1 instance that she bent down to plug in  her CPAP device she felt lightheaded and fell backwards bruising her on the back of the head.  No further recurrence.  Blood work from 02/10/2024 shows NT-proBNP 162 Magnesium  2.3 CMP with BUN 41, creatinine 1.42, eGFR 37 Sodium 137 and potassium 5.1 Mildly elevated ALT 46 which has been chronic.  Normal AST and alkaline phosphatase.   Past Medical History:  Diagnosis Date   Aortic valve regurgitation    Arthritis    Asthma    Breast cancer (HCC)    a. 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   26 mm Edwards Sapien 3 transcatheter heart valve placed via percutaneous right transfemoral approach    Severe aortic stenosis     Past Surgical History:  Procedure Laterality Date   ABDOMINAL HYSTERECTOMY     BELPHAROPTOSIS REPAIR Bilateral    BREAST BIOPSY Left 10/09/2023   MM LT BREAST BX W LOC DEV EA AD LESION IMG BX SPEC STEREO GUIDE 10/09/2023 [facility]   BREAST BIOPSY Left 10/09/2023   MM LT BREAST BX  W LOC DEV 1ST LESION IMAGE BX SPEC STEREO GUIDE 10/09/2023 [facility]   BREAST SURGERY     LUMPECTOMY WITH CHEMO & RADIATION   CATARACT EXTRACTION     EYE SURGERY     INTRAOPERATIVE TRANSTHORACIC ECHOCARDIOGRAM  03/04/2018   Procedure: INTRAOPERATIVE TRANSTHORACIC ECHOCARDIOGRAM;  Surgeon: Wonda Sharper, MD;  Location: [facility] OR;  Service: Open Heart Surgery;;   JOINT REPLACEMENT     BILATERAL HIPS 2007 & 2008   RIGHT/LEFT HEART CATH AND CORONARY ANGIOGRAPHY N/A 01/31/2018   Procedure: RIGHT/LEFT HEART CATH AND CORONARY ANGIOGRAPHY;  Surgeon: Verlin Lonni BIRCH, MD;  Location: [facility]VASIVE [facility];  Service: Cardiovascular;  Laterality: N/A;   ROTATOR CUFF REPAIR     TONSILLECTOMY     TRANSCATHETER AORTIC VALVE  REPLACEMENT, TRANSFEMORAL N/A 03/04/2018   Procedure: TRANSCATHETER AORTIC VALVE REPLACEMENT, TRANSFEMORAL. EDWARDS SAPIEN 3 TRANSCATHETER HEART VALVE.;  Surgeon: Wonda Sharper, MD;  Location: [facility] OR;  Service: Open Heart Surgery;  Laterality: N/A;    Current Medications: Current Meds  Medication Sig   aspirin  EC 81 MG tablet Take 1 tablet (81 mg total) by mouth daily. Swallow whole.   atorvastatin  (LIPITOR) 20 MG tablet Take 20 mg by mouth at bedtime.   budesonide-formoterol (SYMBICORT) 160-4.5 MCG/ACT inhaler Inhale 2 puffs into the lungs.   CALCIUM  CITRATE PO Take 2,000 Units by mouth daily.   Cholecalciferol (VITAMIN D3) 50 MCG (2000 UT) TABS Take 1 tablet by mouth daily.   citalopram  (CELEXA ) 20 MG tablet Take 20 mg by mouth daily.   clobetasol cream (TEMOVATE) 0.05 % Apply 1 application topically 2 (two) times daily as needed (vaginal irritation).   empagliflozin  (JARDIANCE ) 10 MG TABS tablet Take 1 tablet (10 mg total) by mouth daily before breakfast.   furosemide  (LASIX ) 20 MG tablet TAKE 1 TABLET BY MOUTH DAILY. PLEASE KEEP SCHEDULED APPOINTMENT FOR FUTURE REFILLS. THANK YOU.   letrozole  (FEMARA ) 2.5 MG tablet TAKE 1 TABLET BY MOUTH EVERY DAY   metoprolol  succinate (TOPROL  XL) 25 MG 24 hr tablet Take 1 tablet (25 mg total) by mouth daily.   Multiple Vitamin (MULTIVITAMIN) tablet Take 1 tablet by mouth daily.   nystatin (MYCOSTATIN/NYSTOP) powder Apply topically 2 (two) times daily.   pantoprazole  (PROTONIX ) 40 MG tablet Take 40 mg by mouth daily.   Probiotic Product (PROBIOTIC-10 PO) Take 1 tablet by mouth every other day.   spironolactone  (ALDACTONE ) 25 MG tablet Take 0.5 tablets (12.5 mg total) by mouth daily.   valACYclovir (VALTREX) 1000 MG tablet Take 1,000 mg by mouth daily.   valsartan  (DIOVAN ) 40 MG tablet Take 1 tablet (40 mg total) by mouth 2 (two) times daily.     Allergies:   Entresto  [sacubitril-valsartan ], Plavix  [clopidogrel  bisulfate], Codeine, and  Oxycodone -acetaminophen    Social History   Socioeconomic History   Marital status: Widowed    Spouse name: Not on file   Number of children: 1   Years of education: Not on file   Highest education level: GED or equivalent  Occupational History   Not on file  Tobacco Use   Smoking status: Former    Current packs/day: 0.00    Average packs/day: 1.5 packs/day for 20.0 years (30.0 ttl pk-yrs)    Types: Cigarettes    Start date: 06/04/1970    Quit date: 06/04/1990    Years since quitting: 33.9    Passive exposure: Past   Smokeless tobacco: Never  Vaping Use   Vaping status: Never Used  Substance and Sexual Activity  Alcohol use: No    Comment: none since 1985   Drug use: No   Sexual activity: Not on file  Other Topics Concern   Not on file  Social History Narrative   Lives at home alone   Right handed   Caffeine: 1-2 cups/day of coffee   Social Drivers of Corporate investment banker Strain: Not on file  Food Insecurity: No Food Insecurity (06/28/2021)   Hunger Vital Sign    Worried About Running Out of Food in the Last Year: Never true    Ran Out of Food in the Last Year: Never true  Transportation Needs: No Transportation Needs (06/28/2021)   PRAPARE - Administrator, Civil Service (Medical): No    Lack of Transportation (Non-Medical): No  Physical Activity: Not on file  Stress: Not on file  Social Connections: Not on file     Family History: The patient's family history includes Bladder Cancer (age of onset: 50) in her sister; Breast cancer in her cousin and cousin; Breast cancer (age of onset: 10) in her sister; Breast cancer (age of onset: 54) in her sister; Cancer in her niece; Diabetes Mellitus II in her paternal grandfather, sister, and sister; Heart Problems in her brother and sister; Heart disease in her brother, maternal grandfather, and mother; Hepatitis C in her sister; Kidney cancer in her niece; Lung cancer in her cousin and paternal uncle; Lung  cancer (age of onset: 44) in her sister; Stroke in her father; Throat cancer in her sister. There is no history of Sleep apnea. ROS:   Please see the history of present illness.    All 14 point review of systems negative except as described per history of present illness.  EKGs/Labs/Other Studies Reviewed:    The following studies were reviewed today:   EKG:       Recent Labs: 10/04/2023: TSH 1.48; TSH 1.48 11/15/2023: Hemoglobin 13.5; Platelet Count 244 02/10/2024: ALT 46; BUN 41; Creatinine, Ser 1.42; Magnesium  2.3; NT-Pro BNP 162; Potassium 5.1; Sodium 137  Recent Lipid Panel    Component Value Date/Time   CHOL 160 10/04/2023 0000   CHOL 161 06/28/2022 0933   TRIG 147 10/04/2023 0000   HDL 42 10/04/2023 0000   HDL 43 06/28/2022 0933   CHOLHDL 3.7 06/28/2022 0933   LDLCALC 89 10/04/2023 0000   LDLCALC 90 06/28/2022 0933    Physical Exam:    VS:  BP 123/76   Pulse 68   Ht 5' 5 (1.651 m)   Wt 212 lb 9.6 oz (96.4 kg)   SpO2 93%   BMI 35.38 kg/m     Wt Readings from Last 3 Encounters:  05/05/24 212 lb 9.6 oz (96.4 kg)  04/07/24 214 lb 6.4 oz (97.3 kg)  02/14/24 215 lb 3.2 oz (97.6 kg)     GENERAL:  Well nourished, well developed in no acute distress NECK: No JVD; No carotid bruits CARDIAC: RRR, S1 and S2 present, 3/6 ejection systolic murmur CHEST:  Clear to auscultation without rales, wheezing or rhonchi  Extremities: No pitting pedal edema. Pulses bilaterally symmetric with radial 2+ and dorsalis pedis 2+ NEUROLOGIC:  Alert and oriented x 3  Medication Adjustments/Labs and Tests Ordered: Current medicines are reviewed at length with the patient today.  Concerns regarding medicines are outlined above.  Orders Placed This Encounter  Procedures   CBC with Differential/Platelet   Comprehensive metabolic panel with GFR   Magnesium    Lipid panel   EKG 12-Lead  No orders of the defined types were placed in this encounter.   Signed, Judith jess Kobus,  MD, MPH, [facility]. 05/05/2024 12:10 PM     [facility]

## 2024-05-05 NOTE — Assessment & Plan Note (Signed)
 mildly reduced LVEF, 45 to 50% by echocardiogram June 2025 Started to decline since April 2022 and echocardiogram with EF 40 to 45%.   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  Now with cardiac PET stress test 04/08/2024 showing ischemia in LCx territory.  Ischemic evaluation for cardiomyopathy reviewed and to proceed with cardiac cath.  Appears euvolemic and compensated. Weight has decreased to 2212 pounds in comparison to 217 pounds at last visit. Continue Lasix  20 mg once daily  Guideline directed medical therapy Continue her current dose of valsartan  40 mg twice daily Did not tolerate Entresto  in the past due to itching Tolerating metoprolol  succinate 25 mg once daily [started last visit] Continue spironolactone  12.5 mg once daily [started last visit] Continue Jardiance  10 mg once daily that was started at last visit.  Follow-up blood work today CMP, magnesium 

## 2024-05-06 ENCOUNTER — Ambulatory Visit: Payer: Self-pay

## 2024-05-06 LAB — CBC WITH DIFFERENTIAL/PLATELET
Basophils Absolute: 0.1 x10E3/uL (ref 0.0–0.2)
Basos: 1 %
EOS (ABSOLUTE): 0.2 x10E3/uL (ref 0.0–0.4)
Eos: 3 %
Hematocrit: 45.3 % (ref 34.0–46.6)
Hemoglobin: 14.8 g/dL (ref 11.1–15.9)
Immature Grans (Abs): 0 x10E3/uL (ref 0.0–0.1)
Immature Granulocytes: 0 %
Lymphocytes Absolute: 2.4 x10E3/uL (ref 0.7–3.1)
Lymphs: 38 %
MCH: 32.8 pg (ref 26.6–33.0)
MCHC: 32.7 g/dL (ref 31.5–35.7)
MCV: 100 fL — ABNORMAL HIGH (ref 79–97)
Monocytes Absolute: 0.6 x10E3/uL (ref 0.1–0.9)
Monocytes: 9 %
Neutrophils Absolute: 3.1 x10E3/uL (ref 1.4–7.0)
Neutrophils: 49 %
Platelets: 276 x10E3/uL (ref 150–450)
RBC: 4.51 x10E6/uL (ref 3.77–5.28)
RDW: 13.2 % (ref 11.7–15.4)
WBC: 6.3 x10E3/uL (ref 3.4–10.8)

## 2024-05-06 LAB — COMPREHENSIVE METABOLIC PANEL WITH GFR
ALT: 36 IU/L — ABNORMAL HIGH (ref 0–32)
AST: 27 IU/L (ref 0–40)
Albumin: 4.5 g/dL (ref 3.8–4.8)
Alkaline Phosphatase: 94 IU/L (ref 49–135)
BUN/Creatinine Ratio: 18 (ref 12–28)
BUN: 20 mg/dL (ref 8–27)
Bilirubin Total: 0.6 mg/dL (ref 0.0–1.2)
CO2: 25 mmol/L (ref 20–29)
Calcium: 9.7 mg/dL (ref 8.7–10.3)
Chloride: 99 mmol/L (ref 96–106)
Creatinine, Ser: 1.1 mg/dL — ABNORMAL HIGH (ref 0.57–1.00)
Globulin, Total: 2.6 g/dL (ref 1.5–4.5)
Glucose: 99 mg/dL (ref 70–99)
Potassium: 4.8 mmol/L (ref 3.5–5.2)
Sodium: 139 mmol/L (ref 134–144)
Total Protein: 7.1 g/dL (ref 6.0–8.5)
eGFR: 51 mL/min/1.73 — ABNORMAL LOW (ref >59)

## 2024-05-06 LAB — LIPID PANEL
Chol/HDL Ratio: 4 ratio (ref 0.0–4.4)
Cholesterol, Total: 169 mg/dL (ref 100–199)
HDL: 42 mg/dL (ref >39)
LDL Chol Calc (NIH): 100 mg/dL — ABNORMAL HIGH (ref 0–99)
Triglycerides: 154 mg/dL — ABNORMAL HIGH (ref 0–149)
VLDL Cholesterol Cal: 27 mg/dL (ref 5–40)

## 2024-05-06 LAB — MAGNESIUM: Magnesium: 2.3 mg/dL (ref 1.6–2.3)

## 2024-05-07 ENCOUNTER — Telehealth: Payer: Self-pay | Admitting: *Deleted

## 2024-05-07 NOTE — Telephone Encounter (Signed)
 Cardiac Catheterization scheduled at [facility] for: Monday May 11, 2024 9 AM Arrival time [facility] Main Entrance A at: 7 AM  Diet: -Nothing to eat after midnight.  Hydration: -May drink clear liquids until 2 hours before the procedure.  Approved liquids: Water, clear tea, black coffee, fruit juices-non-citric and without pulp,Gatorade, plain Jello/popsicles.   -Please drink 16 oz of water 2 hours before procedure.  Medication instructions:  -Hold:  Spironolactone /Lasix /Valsartan -day before and day of procedure-per protocol GFR < 60 (51)  Jardiance  -AM of procedure -Other usual morning medications can be taken including aspirin  81 mg.  Plan to go home the same day, you will only stay overnight if medically necessary.  You must have responsible adult to drive you home.  Someone must be with you the first 24 hours after you arrive home.  Reviewed procedure instructions with patient.

## 2024-05-11 ENCOUNTER — Encounter (HOSPITAL_COMMUNITY): Payer: Self-pay | Admitting: Internal Medicine

## 2024-05-11 ENCOUNTER — Other Ambulatory Visit: Payer: Self-pay

## 2024-05-11 ENCOUNTER — Ambulatory Visit (HOSPITAL_COMMUNITY)
Admission: RE | Admit: 2024-05-11 | Discharge: 2024-05-11 | Disposition: A | Discharge status: Home / Self Care | Attending: Internal Medicine | Admitting: Internal Medicine

## 2024-05-11 ENCOUNTER — Encounter (HOSPITAL_COMMUNITY)
Admission: RE | Disposition: A | Discharge status: Home / Self Care | Payer: Self-pay | Source: Home / Self Care | Attending: Internal Medicine

## 2024-05-11 DIAGNOSIS — Z7984 Long term (current) use of oral hypoglycemic drugs: Secondary | ICD-10-CM | POA: Insufficient documentation

## 2024-05-11 DIAGNOSIS — R9430 Abnormal result of cardiovascular function study, unspecified: Secondary | ICD-10-CM | POA: Diagnosis not present

## 2024-05-11 DIAGNOSIS — N182 Chronic kidney disease, stage 2 (mild): Secondary | ICD-10-CM | POA: Diagnosis not present

## 2024-05-11 DIAGNOSIS — G4733 Obstructive sleep apnea (adult) (pediatric): Secondary | ICD-10-CM | POA: Insufficient documentation

## 2024-05-11 DIAGNOSIS — I429 Cardiomyopathy, unspecified: Secondary | ICD-10-CM | POA: Insufficient documentation

## 2024-05-11 DIAGNOSIS — E785 Hyperlipidemia, unspecified: Secondary | ICD-10-CM | POA: Diagnosis not present

## 2024-05-11 DIAGNOSIS — Z953 Presence of xenogenic heart valve: Secondary | ICD-10-CM | POA: Diagnosis not present

## 2024-05-11 DIAGNOSIS — R9439 Abnormal result of other cardiovascular function study: Secondary | ICD-10-CM

## 2024-05-11 DIAGNOSIS — Z87891 Personal history of nicotine dependence: Secondary | ICD-10-CM | POA: Insufficient documentation

## 2024-05-11 DIAGNOSIS — I509 Heart failure, unspecified: Secondary | ICD-10-CM | POA: Diagnosis not present

## 2024-05-11 DIAGNOSIS — I130 Hypertensive heart and chronic kidney disease with heart failure and stage 1 through stage 4 chronic kidney disease, or unspecified chronic kidney disease: Secondary | ICD-10-CM | POA: Diagnosis not present

## 2024-05-11 SURGERY — CORONARY ANGIOGRAPHY (CATH LAB)
Anesthesia: LOCAL

## 2024-05-11 MED ORDER — ASPIRIN 81 MG PO CHEW: 81.0000 mg | CHEWABLE_TABLET | ORAL | Status: DC

## 2024-05-11 MED ORDER — MIDAZOLAM HCL 2 MG/2ML IJ SOLN
INTRAMUSCULAR | Status: AC
  Filled 2024-05-11: qty 2

## 2024-05-11 MED ORDER — IOHEXOL 350 MG/ML SOLN
INTRAVENOUS | Status: DC | PRN
  Administered 2024-05-11: 30 mL

## 2024-05-11 MED ORDER — FENTANYL CITRATE (PF) 100 MCG/2ML IJ SOLN
INTRAMUSCULAR | Status: DC | PRN
  Administered 2024-05-11: 25 ug via INTRAVENOUS

## 2024-05-11 MED ORDER — LABETALOL HCL 5 MG/ML IV SOLN: 10.0000 mg | INTRAVENOUS | Status: DC | PRN

## 2024-05-11 MED ORDER — VERAPAMIL HCL 2.5 MG/ML IV SOLN
INTRAVENOUS | Status: AC
  Filled 2024-05-11: qty 2

## 2024-05-11 MED ORDER — LIDOCAINE HCL (PF) 1 % IJ SOLN
INTRAMUSCULAR | Status: DC | PRN
Start: 2024-05-11 — End: 2024-05-11
  Administered 2024-05-11: 2 mL via INTRADERMAL

## 2024-05-11 MED ORDER — SODIUM CHLORIDE 0.9% FLUSH: 3.0000 mL | INTRAVENOUS | Status: DC | PRN

## 2024-05-11 MED ORDER — FENTANYL CITRATE (PF) 100 MCG/2ML IJ SOLN
INTRAMUSCULAR | Status: AC
  Filled 2024-05-11: qty 2

## 2024-05-11 MED ORDER — SODIUM CHLORIDE 0.9 % IV SOLN: 250.0000 mL | INTRAVENOUS | Status: DC | PRN

## 2024-05-11 MED ORDER — SODIUM CHLORIDE 0.9% FLUSH: 3.0000 mL | Freq: Two times a day (BID) | INTRAVENOUS | Status: DC

## 2024-05-11 MED ORDER — ONDANSETRON HCL 4 MG/2ML IJ SOLN: 4.0000 mg | Freq: Four times a day (QID) | INTRAMUSCULAR | Status: DC | PRN

## 2024-05-11 MED ORDER — HEPARIN (PORCINE) IN NACL 1000-0.9 UT/500ML-% IV SOLN
INTRAVENOUS | Status: DC | PRN
  Administered 2024-05-11: 1000 mL

## 2024-05-11 MED ORDER — VERAPAMIL HCL 2.5 MG/ML IV SOLN
INTRAVENOUS | Status: DC | PRN
  Administered 2024-05-11: 5 mL via INTRA_ARTERIAL

## 2024-05-11 MED ORDER — LIDOCAINE HCL (PF) 1 % IJ SOLN
INTRAMUSCULAR | Status: AC
  Filled 2024-05-11: qty 30

## 2024-05-11 MED ORDER — HEPARIN SODIUM (PORCINE) 1000 UNIT/ML IJ SOLN
INTRAMUSCULAR | Status: DC | PRN
  Administered 2024-05-11: 5000 [IU] via INTRA_ARTERIAL

## 2024-05-11 MED ORDER — MIDAZOLAM HCL (PF) 2 MG/2ML IJ SOLN
INTRAMUSCULAR | Status: DC | PRN
  Administered 2024-05-11: 1 mg via INTRAVENOUS

## 2024-05-11 MED ORDER — HEPARIN SODIUM (PORCINE) 1000 UNIT/ML IJ SOLN
INTRAMUSCULAR | Status: AC
  Filled 2024-05-11: qty 10

## 2024-05-11 MED ORDER — FREE WATER: 500.0000 mL | Freq: Once | Status: DC

## 2024-05-11 MED ORDER — HYDRALAZINE HCL 20 MG/ML IJ SOLN
10.0000 mg | INTRAMUSCULAR | Status: DC | PRN
Start: 2024-05-11 — End: 2024-05-11

## 2024-05-11 SURGICAL SUPPLY — 6 items
CATH INFINITI 6F ANG MULTIPACK (CATHETERS) IMPLANT
DEVICE RAD COMP TR BAND LRG (VASCULAR PRODUCTS) IMPLANT
GLIDESHEATH SLEND SS 6F .021 (SHEATH) IMPLANT
PACK CARDIAC CATHETERIZATION (CUSTOM PROCEDURE TRAY) ×1 IMPLANT
SET ATX-X65L (MISCELLANEOUS) IMPLANT
WIRE EMERALD 3MM-J .035X260CM (WIRE) IMPLANT

## 2024-05-11 NOTE — Interval H&P Note (Signed)
 History and Physical Interval Note:  05/11/2024 7:15 AM  Judith Solis  has presented today for surgery, with the diagnosis of abnormal stress test - angina - shortness of breath.  The various methods of treatment have been discussed with the patient and family. After consideration of risks, benefits and other options for treatment, the patient has consented to  Procedure(s): LEFT HEART CATH AND CORONARY ANGIOGRAPHY (N/A) as a surgical intervention.  The patient's history has been reviewed, patient examined, no change in status, stable for surgery.  I have reviewed the patient's chart and labs.  Questions were answered to the patient's satisfaction.     Lanelle Lindo K Carletta Feasel

## 2024-05-11 NOTE — Discharge Instructions (Signed)

## 2024-05-11 NOTE — Progress Notes (Signed)
 Patient and friend was given discharge instructions. Both verbalized understanding.

## 2024-05-14 ENCOUNTER — Inpatient Hospital Stay: Attending: Oncology | Admitting: Oncology

## 2024-05-14 ENCOUNTER — Encounter: Payer: Self-pay | Admitting: Oncology

## 2024-05-14 ENCOUNTER — Telehealth: Payer: Self-pay | Admitting: Oncology

## 2024-05-14 VITALS — BP 101/70 | HR 76 | Temp 97.6°F | Resp 16 | Ht 65.0 in | Wt 213.5 lb

## 2024-05-14 DIAGNOSIS — Z9221 Personal history of antineoplastic chemotherapy: Secondary | ICD-10-CM | POA: Insufficient documentation

## 2024-05-14 DIAGNOSIS — Z853 Personal history of malignant neoplasm of breast: Secondary | ICD-10-CM | POA: Insufficient documentation

## 2024-05-14 DIAGNOSIS — Z923 Personal history of irradiation: Secondary | ICD-10-CM | POA: Diagnosis not present

## 2024-05-14 DIAGNOSIS — Z79811 Long term (current) use of aromatase inhibitors: Secondary | ICD-10-CM | POA: Diagnosis not present

## 2024-05-14 DIAGNOSIS — R232 Flushing: Secondary | ICD-10-CM | POA: Insufficient documentation

## 2024-05-14 DIAGNOSIS — C50912 Malignant neoplasm of unspecified site of left female breast: Secondary | ICD-10-CM | POA: Diagnosis not present

## 2024-05-14 DIAGNOSIS — M8580 Other specified disorders of bone density and structure, unspecified site: Secondary | ICD-10-CM | POA: Insufficient documentation

## 2024-05-14 DIAGNOSIS — Z170 Estrogen receptor positive status [ER+]: Secondary | ICD-10-CM | POA: Insufficient documentation

## 2024-05-14 DIAGNOSIS — C50312 Malignant neoplasm of lower-inner quadrant of left female breast: Secondary | ICD-10-CM | POA: Diagnosis not present

## 2024-05-14 DIAGNOSIS — Z1721 Progesterone receptor positive status: Secondary | ICD-10-CM | POA: Diagnosis not present

## 2024-05-14 DIAGNOSIS — Z1732 Human epidermal growth factor receptor 2 negative status: Secondary | ICD-10-CM | POA: Insufficient documentation

## 2024-05-14 NOTE — Telephone Encounter (Signed)
 Patient has been scheduled for follow-up visit per 05/14/24 LOS.  Pt given an appt calendar with date and time.

## 2024-05-14 NOTE — Progress Notes (Signed)
 [facility]  [address] 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 female who we began seeing in August 2014 for follow-up of breast cancer, when she moved to the area from [place] .  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  She was treated with lumpectomy.  Pathology revealed a 1.5 cm, grade 3, invasive ductal carcinoma with a negative nodes.  Estrogen and progesterone receptors were positive and her 2 Neu negative.  Oncotype DX score was 25, which is in an intermediate risk category.  She received adjuvant chemotherapy with Taxotere and Cytoxan for 3 months, followed by adjuvant radiation to the left breast, as well as Arimidex for 5 years completed in March 2015.  She is here for yearly routine follow-up and states she has been doing fairly well.  She denies any changes in her breasts.  Prior to her visit today she did undergo bilateral diagnostic mammogram.  She has had problems with severe bladder/urethral/vaginal pressure with urinary frequency.  She was seen by Dr. Gaston and underwent cystoscopy, which was negative.  He felt her symptoms were most likely due to vaginal dryness.  Her symptoms did not improve with Vesicare.  In Jun 27, 2015, she saw Dr. Josephine for cardiac evaluation and was found to have congestive heart failure, as well as aortic stenosis.  She was placed on losartan /hydrochlorothiazide  50/12.5 daily.  She had an echocardiogram at that time.  She had shoulder surgery in 06/27/2015.  She had osteopenia on bone density in March 2015.  Repeat bone density scan in 06-26-16 revealed persistent  osteopenia.  There was a 6% worsening of her spine, but a 3% improvement of her left forearm.  Her husband passed away in 12/05/18after a prolonged illness.  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  She states that she has had two sisters who had breast cancer, and had to undergo bilateral mastectomies.  She also had a sister who died of lung cancer at age 42.  One of the sisters that had breast cancer had advanced bladder cancer and required total cystectomy.  So far genetic testing has been negative on family members.  Screening bilateral mammogram done on 09/16/2023 that revealed a suspicious cluster of microcalcifications in the left breast.  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 She had her lumpectomy done on 10/31/2023 by Dr. Joesph and she has been healing very well. 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 This is PR positive at 100%, ER positive at 80%, HER2 negative (0), and had a KI67 at <1%, all margins were clear.  She was placed on hormonal therapy with letrozole  since April 2025.  INTERVAL HISTORY:  Judith Solis was here for her follow-up of 2 breast cancers. She has a history of stage IA hormone receptor positive left breast cancer in 2013-06-26 This was treated with chemotherapy with TC for  3 months, radiation, and Arimidex for 3 years. She was found to have  stage IA (T1aN0M0) invasive ductal carcinoma, grade 2 in April, 2025. Pathology also showed ADH and 5 mm of invasive ductal carcinoma. She had already completed 5 years of Anastrozole nearly 10 years ago so she started Letrozole  2.5 mg daily in April 2025. Patient states that she feels well, but complains of left ankle pain rated 3/10. She is wearing an  ankle brace today. She is taking 2.5 mg letrozole  daily and continues to experience hot flashes. She had labs done on 05/05/2024 at the Sleep Medicine lab were all normal other than an elevated creatinine of 1.10, improved from 1.42. She had an abnormal stress test and was then scheduled for cardiac catheterization on 05/11/2024, but no obstructive coronary artery disease was found. Her ejection fraction was 40-50%. I will see her back in 3 months with physical exam. She denies fever, chills, night sweats, or other signs of infection. She denies cardiorespiratory and gastrointestinal issues. She  denies pain. Her appetite is good and Her weight has decreased 2 pounds over last 3 months.  REVIEW OF SYSTEMS:  Review of Systems  Constitutional: Negative.  Negative for appetite change, chills, diaphoresis, fatigue, fever and unexpected weight change.  HENT:  Negative.  Negative for hearing loss, lump/mass, mouth sores, nosebleeds, sore throat, tinnitus, trouble swallowing and voice change.   Eyes: Negative.   Respiratory: Negative.  Negative for chest tightness, cough, hemoptysis, shortness of breath and wheezing.   Cardiovascular: Negative.  Negative for chest pain, leg swelling and palpitations.  Gastrointestinal: Negative.  Negative for abdominal distention, abdominal pain, blood in stool, constipation, diarrhea, nausea, rectal pain and vomiting.  Endocrine: Positive for hot flashes (severe).  Genitourinary: Negative.  Negative for bladder incontinence, difficulty urinating, dyspareunia, dysuria, frequency, hematuria, menstrual problem, nocturia, pelvic pain, vaginal bleeding and vaginal discharge.   Musculoskeletal:  Positive for arthralgias (left ankle pain 3/10 (wearing brace)). Negative for back pain, flank pain, gait problem, myalgias, neck pain and neck stiffness.  Skin: Negative.  Negative for itching, rash and wound.  Neurological: Negative.  Negative for dizziness, extremity weakness, gait  problem, headaches, light-headedness, numbness, seizures and speech difficulty.  Hematological: Negative.  Negative for adenopathy. Does not bruise/bleed easily.  Psychiatric/Behavioral: Negative.  Negative for confusion, decreased concentration, depression, sleep disturbance and suicidal ideas. The patient is not nervous/anxious.      VITALS:  Blood pressure 101/70, pulse 76, temperature 97.6 F (36.4 C), temperature source Tympanic, resp. rate 16, height 5' 5 (1.651 m), weight 213 lb 8 oz (96.8 kg), SpO2 96%.  Wt Readings from Last 3 Encounters:  05/14/24 213 lb 8 oz (96.8 kg)  05/11/24 212 lb (96.2 kg)  05/05/24 212 lb 9.6 oz (96.4 kg)    Body mass index is 35.53 kg/m.  Performance status (ECOG): 1 - Symptomatic but completely ambulatory  PHYSICAL EXAM:  Physical Exam Vitals and nursing note reviewed.  Constitutional:      General: She is not in acute distress.    Appearance: Normal appearance. She is normal weight. She is not ill-appearing, toxic-appearing or diaphoretic.  HENT:     Head: Normocephalic and atraumatic.     Right Ear: Tympanic membrane, ear canal and external ear normal. There is no impacted cerumen.     Left Ear: Tympanic membrane, ear canal and external ear normal. There is no impacted cerumen.     Nose: Nose normal. No congestion or rhinorrhea.     Mouth/Throat:  Mouth: Mucous membranes are moist.     Pharynx: Oropharynx is clear. No oropharyngeal exudate or posterior oropharyngeal erythema.  Eyes:     General: No scleral icterus.       Right eye: No discharge.        Left eye: No discharge.     Extraocular Movements: Extraocular movements intact.     Conjunctiva/sclera: Conjunctivae normal.     Pupils: Pupils are equal, round, and reactive to light.  Neck:     Vascular: No carotid bruit.  Cardiovascular:     Rate and Rhythm: Normal rate and regular rhythm.     Pulses: Normal pulses.     Heart sounds: Normal heart sounds. No murmur heard.    No  friction rub. No gallop.  Pulmonary:     Effort: Pulmonary effort is normal. No respiratory distress.     Breath sounds: Normal breath sounds. No stridor. No wheezing, rhonchi or rales.  Chest:     Chest wall: No tenderness.  Breasts:    Right: Normal.     Left: Tenderness present.     Comments: Scar in the anterior mid sternum Deep well-healed scar and firmness in the lateral left breast Fibrocystic changes in the lateral left breast Well-healed scar in the inferior left breast No masses in either breast Abdominal:     General: Bowel sounds are normal. There is no distension.     Palpations: Abdomen is soft. There is no hepatomegaly, splenomegaly or mass.     Tenderness: There is no abdominal tenderness. There is no right CVA tenderness, left CVA tenderness, guarding or rebound.     Hernia: No hernia is present.  Musculoskeletal:        General: No swelling, tenderness, deformity or signs of injury. Normal range of motion.     Cervical back: Normal range of motion and neck supple. No rigidity or tenderness.     Right lower leg: No edema.     Left lower leg: No edema.  Lymphadenopathy:     Cervical: No cervical adenopathy.     Right cervical: No superficial, deep or posterior cervical adenopathy.    Left cervical: No superficial, deep or posterior cervical adenopathy.     Upper Body:     Right upper body: No supraclavicular, axillary or pectoral adenopathy.     Left upper body: No supraclavicular, axillary or pectoral adenopathy.  Skin:    General: Skin is warm and dry.     Coloration: Skin is not jaundiced or pale.     Findings: No bruising, erythema, lesion or rash.  Neurological:     General: No focal deficit present.     Mental Status: She is alert and oriented to person, place, and time. Mental status is at baseline.     Cranial Nerves: No cranial nerve deficit.     Sensory: No sensory deficit.     Motor: No weakness.     Coordination: Coordination normal.     Gait:  Gait normal.     Deep Tendon Reflexes: Reflexes normal.  Psychiatric:        Mood and Affect: Mood normal.        Behavior: Behavior normal.        Thought Content: Thought content normal.        Judgment: Judgment normal.     LABS:      Latest Ref Rng & Units 05/05/2024   10:15 AM 11/15/2023    2:59 PM 10/22/2023  3:58 PM  CBC  WBC 3.4 - 10.8 x10E3/uL 6.3  7.0  8.7   Hemoglobin 11.1 - 15.9 g/dL 85.1  86.4  86.2   Hematocrit 34.0 - 46.6 % 45.3  39.8  40.0   Platelets 150 - 450 x10E3/uL 276  244  246       Latest Ref Rng & Units 05/05/2024   10:15 AM 02/10/2024   10:22 AM 11/15/2023    2:59 PM  CMP  Glucose 70 - 99 mg/dL 99  882  889   BUN 8 - 27 mg/dL 20  41  24   Creatinine 0.57 - 1.00 mg/dL 8.89  8.57  9.06   Sodium 134 - 144 mmol/L 139  137  140   Potassium 3.5 - 5.2 mmol/L 4.8  5.1  4.5   Chloride 96 - 106 mmol/L 99  99  102   CO2 20 - 29 mmol/L 25  20  27    Calcium  8.7 - 10.3 mg/dL 9.7  89.8  9.8   Total Protein 6.0 - 8.5 g/dL 7.1  7.5  7.3   Total Bilirubin 0.0 - 1.2 mg/dL 0.6  0.6  0.4   Alkaline Phos 49 - 135 IU/L 94  103  87   AST 0 - 40 IU/L 27  35  32   ALT 0 - 32 IU/L 36  46  43    Lab Results  Component Value Date   TSH 1.48 10/04/2023   TSH 1.48 10/04/2023   Component Ref Range & Units (hover) 3 d ago (02/10/24) 5 yr ago (03/05/18) 5 yr ago (03/04/18)  Magnesium  2.3 2.0 R, CM 2.0 R, CM    STUDIES:  EXAM: 04/08/2024 PT NM PET CT CARDIAC PERF MULTI W/ABSOLUTE BLOODFLOW  IMPRESSION: No significant extracardiac findings  Final Pathology: 10/31/2023 Breast, Lumpectomy, Left Breast Invasive ductal carcinoma, grade 2, 5mm DCIS with calcifications and comedo necrosis Resolving hematoma with fibrosis PR1 00% ER 80% HER2- negative (0) Ki67 <1% All margins are clear  Pathology: 10/09/2023 Breast, left, needle core biopsy, Anterior extent       - ATYPICAL DUCTAL HYPERPLASIA BORDERING ON DUCTAL CARCINOMA IN SITU.SEE NOTE.       - CALCIFICATIONS  ARE PRESENT       - NO MALIGNANCY IDENTIFIED  Breast, left, needle core biopsy, Posterior extent        - ATYPICAL DUCTAL HYPERPLASIA BORDERING ON DUCTAL CARCINOMA IN SITU.SEE NOTE.          - CALCIFICATIONS ARE PRESENT           - NO MALIGNANCY IDENTIFIED     Allergies:  Allergies  Allergen Reactions   Entresto  [Sacubitril-Valsartan ] Itching   Plavix  [Clopidogrel  Bisulfate] Itching   Codeine Nausea And Vomiting   Oxycodone -Acetaminophen  Other (See Comments)    Hallucinations    Current Medications: Current Outpatient Medications  Medication Sig Dispense Refill   Probiotic Product (PROBIOTIC-10 PO) Take 1 tablet by mouth every other day. Culturelle (Patient taking differently: Take 1 tablet by mouth daily. Culturelle)     spironolactone  (ALDACTONE ) 25 MG tablet Take 0.5 tablets (12.5 mg total) by mouth daily. 45 tablet 3   aspirin  EC 81 MG tablet Take 1 tablet (81 mg total) by mouth daily. Swallow whole. 90 tablet 3   atorvastatin  (LIPITOR) 20 MG tablet Take 20 mg by mouth at bedtime.     budesonide-formoterol (SYMBICORT) 160-4.5 MCG/ACT inhaler Inhale 2 puffs into the lungs daily as needed.     CALCIUM   CITRATE PO Take 1,200 mg by mouth daily.     Cholecalciferol (VITAMIN D3) 50 MCG (2000 UT) TABS Take 2,000 Int'l Units by mouth daily.     citalopram  (CELEXA ) 20 MG tablet Take 20 mg by mouth daily.     clobetasol cream (TEMOVATE) 0.05 % Apply 1 application topically 2 (two) times daily as needed (vaginal irritation).     empagliflozin  (JARDIANCE ) 10 MG TABS tablet Take 1 tablet (10 mg total) by mouth daily before breakfast. 30 tablet 12   estradiol (ESTRACE) 0.01 % CREA vaginal cream Place 1 Applicatorful vaginally 3 (three) times a week.     furosemide  (LASIX ) 20 MG tablet TAKE 1 TABLET BY MOUTH DAILY. PLEASE KEEP SCHEDULED APPOINTMENT FOR FUTURE REFILLS. THANK YOU. 90 tablet 2   letrozole  (FEMARA ) 2.5 MG tablet TAKE 1 TABLET BY MOUTH EVERY DAY 90 tablet 3   MAGNESIUM  PO Take 500  mg by mouth daily.     metoprolol  succinate (TOPROL  XL) 25 MG 24 hr tablet Take 1 tablet (25 mg total) by mouth daily. 90 tablet 3   Multiple Vitamin (MULTIVITAMIN) tablet Take 1 tablet by mouth daily.     pantoprazole  (PROTONIX ) 40 MG tablet Take 40 mg by mouth daily.     valACYclovir (VALTREX) 1000 MG tablet Take 1,000 mg by mouth daily.     valsartan  (DIOVAN ) 40 MG tablet Take 1 tablet (40 mg total) by mouth 2 (two) times daily. 180 tablet 3   No current facility-administered medications for this visit.   ASSESSMENT & PLAN:  Assessment:   1.  Remote history of stage IA hormone receptor positive breast cancer, diagnosed in July 2009.  She remains without evidence of recurrence.    2.  Osteopenia for which she is taking oral calcium  and vitamin D. Bone density scans are scheduled through Dr. Ina.  I will request her recent bone density scan and plan to repeat it every 2 years.   3.  Stage 1A T1aN0M0 Invasive Ductal Carcinoma diagnosed in March 2025. This was excised in the process of a lumpectomy for atypical ductal hyperplasia and she did have clear margins. This was grade 2 and highly positive for hormone receptors, negative for HER2 (0), with a Ki-67 less than 1%. She did not require radiation or chemotherapy but I did recommend Letrozole  2.5 mg daily for 5 years and that was started in April of 2025. She's tolerating it well other than severe hot flashes.   4. 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 The sample included excision of her hematoma.   Plan: She feels well, but complains of left ankle pain rated 3/10. She is wearing an ankle brace today. She is taking 2.5 mg letrozole  daily and continues to experience hot flashes. She had labs done on 05/05/2024 at the Sleep Medicine lab were all normal other than an elevated creatinine of 1.10, improved from 1.42. She had an abnormal stress test and was then scheduled for cardiac  catheterization on 05/11/2024, but no obstructive coronary artery disease was found. Her ejection fraction was 40-50%. I will see her back in 3 months with physical exam. The patient understands the plans discussed today and is in agreement with them.  She knows to contact our office if she develops concerns regarding her breast cancer.  I provided 13 minutes of face-to-face time during this this encounter and > 50% was spent counseling as documented under my assessment and plan.   Judith  VEAR Cornish, MD  [facility]ENTER [facility] A DEPT OF [facility] [address] [place] [address] Dept: [phone] Dept: [facility]: [phone]  No orders of the defined types were placed in this encounter.   II have reviewed this report as typed by the medical scribe, and it is complete and accurate.

## 2024-05-19 DIAGNOSIS — Z23 Encounter for immunization: Secondary | ICD-10-CM | POA: Diagnosis not present

## 2024-05-20 ENCOUNTER — Ambulatory Visit: Admitting: Neurology

## 2024-05-20 DIAGNOSIS — G4733 Obstructive sleep apnea (adult) (pediatric): Secondary | ICD-10-CM

## 2024-05-21 NOTE — Progress Notes (Unsigned)
 SABRA

## 2024-05-22 NOTE — Procedures (Signed)
 [facility]  HOME SLEEP TEST (Watch PAT) REPORT  STUDY DATE: 05/20/2024  DOB: 1945-12-23  MRN: [id number]  ORDERING CLINICIAN: True Mar, MD, PhD   REFERRING CLINICIAN: Lauraine Born, NP  CLINICAL INFORMATION/HISTORY (obtained from visit note dated 04/07/2024): 78 year old female with an underlying medical history of lacunar stroke, hyperlipidemia, breast cancer, reflux disease, aortic stenosis with status post TAVR, hypertension, obstructive sleep apnea, obesity, arthritis, status post bilateral hip replacements, status post lumbar laminectomy, who presents for reevaluation of her obstructive sleep apnea.  She has been on AutoPap therapy with a pressure range of 4 to 20 cm, 90th percentile of pressure at 10.4 cm with good tolerance, full compliance and good apnea control.  She should be eligible for a new machine.  BMI: 35.7 kg/m  FINDINGS:   Sleep Summary:   Total Recording Time (hours, min): 7 hours, 32 min  Total Sleep Time (hours, min):  6 hours, 19 min  Percent REM (%):    12.2%   Respiratory Indices:   Calculated pAHI (per hour):  16.2/hour         REM pAHI:    23.4/hour       NREM pAHI: 15.2/hour  Central pAHI: 2.1/hour  Oxygen Saturation Statistics:    Oxygen Saturation (%) Mean: 91%   Minimum oxygen saturation (%):                 84%   O2 Saturation Range (%): 84-98%    O2 Saturation (minutes) <=88%: 4.8 min  Pulse Rate Statistics:   Pulse Mean (bpm):    62/min    Pulse Range (49-99/min)   IMPRESSION: OSA (obstructive sleep apnea), moderate   RECOMMENDATION:  This home sleep test demonstrates moderate obstructive sleep apnea with a total AHI of 16.2/hour and O2 nadir of 84%.  Variable snoring was detected.  Ongoing treatment with a positive airway pressure (PAP) device is recommended. The patient has been compliant with AutoPap therapy.  She should be eligible for a new machine.  I recommend AutoPap at a pressure setting of 6  to 12 cm with EPR of 1 or as per patient preference.  Mask of choice, sized to fit.  A full night titration study may be considered to optimize treatment settings, monitor proper oxygen saturations and aid with improvement of tolerance and adherence, if needed down the road. Alternative treatment options may include a dental device through dentistry or orthodontics in selected patients or Inspire (hypoglossal nerve stimulator) in carefully selected patients (meeting inclusion criteria).  Concomitant weight loss is recommended (where clinically appropriate). Please note that untreated obstructive sleep apnea may carry additional perioperative morbidity. Patients with significant obstructive sleep apnea should receive perioperative PAP therapy and the surgeons and particularly the anesthesiologist should be informed of the diagnosis and the severity of the sleep disordered breathing. The patient should be cautioned not to drive, work at heights, or operate dangerous or heavy equipment when tired or sleepy. Review and reiteration of good sleep hygiene measures should be pursued with any patient. Other causes of the patient's symptoms, including circadian rhythm disturbances, an underlying mood disorder, medication effect and/or an underlying medical problem cannot be ruled out based on this test. Clinical correlation is recommended.  The patient and her referring provider will be notified of the test results. The patient will be seen in follow up in sleep clinic at [facility].  I certify that I have reviewed the raw data recording prior to the issuance of this report in accordance  with the standards of the American Academy of Sleep Medicine (AASM).    INTERPRETING PHYSICIAN:   True Mar, MD, PhD Medical Director, [facility]) Diplomat, ABPN (Neurology and Sleep)   [facility] [address] [place], [place] [address] [phone]

## 2024-05-25 ENCOUNTER — Ambulatory Visit: Payer: Self-pay | Admitting: Neurology

## 2024-05-25 DIAGNOSIS — G4733 Obstructive sleep apnea (adult) (pediatric): Secondary | ICD-10-CM

## 2024-05-25 NOTE — Telephone Encounter (Signed)
 Please call, patient had HST to qualify for a new CPAP machine 05/20/24. HST showed moderate obstructive sleep apnea with a total AHI of 16.2/hour and O2 nadir of 84%. Currently her machine settings is 4-20 cm water, Dr. Buck recommended slight modification at 6-12 cm water EPR 1. Follow up within initial compliance period, if she doesn't like the pressure range, we can go back to prior. Thanks   Orders Placed This Encounter  Procedures   For home use only DME continuous positive airway pressure (CPAP)

## 2024-06-02 DIAGNOSIS — H10023 Other mucopurulent conjunctivitis, bilateral: Secondary | ICD-10-CM | POA: Diagnosis not present

## 2024-06-05 ENCOUNTER — Ambulatory Visit

## 2024-06-05 VITALS — BP 108/62 | HR 68 | Ht 65.0 in | Wt 216.2 lb

## 2024-06-05 DIAGNOSIS — I10 Essential (primary) hypertension: Secondary | ICD-10-CM | POA: Diagnosis not present

## 2024-06-05 DIAGNOSIS — Z952 Presence of prosthetic heart valve: Secondary | ICD-10-CM | POA: Insufficient documentation

## 2024-06-05 DIAGNOSIS — E782 Mixed hyperlipidemia: Secondary | ICD-10-CM | POA: Insufficient documentation

## 2024-06-05 DIAGNOSIS — I5042 Chronic combined systolic (congestive) and diastolic (congestive) heart failure: Secondary | ICD-10-CM | POA: Insufficient documentation

## 2024-06-05 MED ORDER — SPIRONOLACTONE 25 MG PO TABS: 25.0000 mg | ORAL_TABLET | Freq: Every day | ORAL | 3 refills | Status: AC

## 2024-06-05 MED ORDER — VALSARTAN 40 MG PO TABS: 40.0000 mg | ORAL_TABLET | Freq: Every day | ORAL | 3 refills | Status: AC

## 2024-06-05 NOTE — Assessment & Plan Note (Signed)
 Chronic CHF, mildly reduced LVEF 45 to 50%, last echocardiogram June 2025. Nonischemic cardiomyopathy Mildly abnormal cardiac PET stress 04/08/2024 ischemia in LCx territory Cardiac cath 05/11/2024 no significant obstructive CAD.   Appears euvolemic and compensated. Continue with low-salt diet less than 2 g/day. Weight today 213 pounds.  Stable.  Continue Lasix  20 mg once daily. Aware to take additional dose of Lasix  in the afternoon on days where she gains over 3 pounds in a day or 5 pounds in a week.  Guideline directed medical therapy Continue valsartan  40 mg, decrease dose to once a day. Did not tolerate Entresto  due to itching Titrate up spironolactone  dose to 25 mg once a day Continue Jardiance  10 mg once daily Continue metoprolol  succinate 25 mg once daily Tolerating well.  Blood work from 05/05/2024 showed stable electrolytes and kidney function.

## 2024-06-05 NOTE — Progress Notes (Signed)
 Cardiology Consultation:    Date:  06/05/2024   ID:  Judith Solis, DOB 19-Jan-1946, MRN [id number]  PCP:  Ina Marcellus RAMAN, MD  Cardiologist:  Alean SAUNDERS Arseniy Toomey, MD   Referring MD: Ina Marcellus RAMAN, MD   No chief complaint on file.    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 Cornelius. Last echocardiogram to review is from 01/16/2024 with LVEF 45 to 50% mild MR, mild mitral stenosis mean gradient 3.5 mmHg at heart rate 72/min, well-seated bioprosthetic aortic valve with mean gradient 11 mmHg and dimensionless index 0.44. For evaluation of drop in LVEF ischemic evaluation with cardiac PET stress completed 04/08/2024 showed ischemia in LCx territory.  cardiac cath completed 05/11/2024 noted no significant obstructive coronary artery disease.   Here for follow-up visit.   Problem List Items Addressed This Visit     Hyperlipidemia   Continue rosuvastatin 20 mg once daily. Lipid panel 05/05/2024 noted LDL 100, HDL 42. Continue with aggressive dietary modifications. Given no significant coronary artery disease, we will hold off on any further escalation of statin therapy at this time.      Relevant Medications   spironolactone  (ALDACTONE ) 25 MG tablet   valsartan  (DIOVAN ) 40 MG tablet   Essential hypertension   Well-controlled. Continue current medications for guideline directed medical therapy of cardiomyopathy as reviewed below which would also help with her blood pressure  control.       Relevant Medications   spironolactone  (ALDACTONE ) 25 MG tablet   valsartan  (DIOVAN ) 40 MG tablet   S/P TAVR (transcatheter aortic valve replacement)   26 mm Edwards SAPIEN valve placed August 2019.  Last echocardiogram June 2025. Continue annual echocardiogram follow-up tentatively repeat echocardiogram in June 2026 prior to next follow-up visit in the office. Aware of antibiotic prophylaxis required prior to any surgical or invasive procedures.      Heart failure (HCC) - Primary   Chronic CHF, mildly reduced LVEF 45 to 50%, last echocardiogram June 2025. Nonischemic cardiomyopathy Mildly abnormal cardiac PET stress 04/08/2024 ischemia in LCx territory Cardiac cath 05/11/2024 no significant obstructive CAD.   Appears euvolemic and compensated. Continue with low-salt diet less than 2 g/day. Weight today 213 pounds.  Stable.  Continue Lasix  20 mg once daily. Aware to take additional dose of Lasix  in the afternoon on days where she gains over 3 pounds in a day or 5 pounds in a week.  Guideline directed medical therapy Continue valsartan  40 mg, decrease dose to once a day. Did not tolerate Entresto  due to itching Titrate up spironolactone  dose to 25 mg once a day Continue Jardiance  10 mg once daily Continue metoprolol  succinate 25 mg once daily Tolerating well.  Blood work from 05/05/2024 showed stable electrolytes and kidney function.        Relevant Medications   spironolactone  (ALDACTONE ) 25 MG tablet   valsartan  (DIOVAN ) 40 MG tablet   Return to clinic in 6 months after the follow-up echocardiogram in June 2026.   History of Present Illness:  Judith Solis is a 78 y.o. female who is being seen today for follow-up visit. PCP is Ina Marcellus RAMAN, MD. Last visit with me in the office was 05/05/2024  Pleasant woman here for the visit by herself.  Mentions she continues to stay active keeps herself busy.  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 Cornelius. Last echocardiogram to review is from 01/16/2024 with LVEF 45 to 50% mild MR, mild mitral stenosis mean gradient 3.5 mmHg at heart rate 72/min, well-seated bioprosthetic aortic valve with mean gradient 11 mmHg and dimensionless index 0.44. For evaluation of drop in LVEF cardiac PET stress completed 04/08/2024 showed ischemia in LCx territory. Scheduled for cardiac cath completed 05/11/2024 noted no significant obstructive coronary artery disease.  Overall doing well. Did not have any significant issues with cardiac cath. No chest pain or shortness of breath. No pedal edema. No palpitations, lightheadedness, dizziness.  Good with checking her weight at home and consistently around 212-213 pounds.  Good compliance with her medications.  Lipid panel 05/05/2024 total cholesterol 169, triglycerides 154, HDL 42, LDL 100. Magnesium  normal 2.3 Hemoglobin 14.8, hematocrit 45.3 BUN 20, creatinine 1.1, eGFR 51 ALT improved 36 in comparison to prior. AST normal 27, normal alkaline phosphatase  Past Medical History:  Diagnosis Date   Abnormal cardiovascular stress test Abnormal cardiac PET stress test 04/08/2024, intermediate risk study, small area of ischemia in LCx territory [apical to mid inferolateral location]. 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 s/p chemo and radiation   Cervical spondylosis without myelopathy 08/29/2017    DDD (degenerative disc disease), cervical 09/12/2017   Essential hypertension 04/05/2017   Family history of breast cancer 01/10/2021   GERD (gastroesophageal reflux disease)    Heart disease    Heart failure (HCC)    Hyperlipidemia    Hypertension    Mitral stenosis 01/23/2024   Morbid obesity (HCC)    Osteopenia after menopause 09/22/2013   PVCs (premature ventricular contractions) 10/21/2016   Restless leg syndrome    S/P TAVR (transcatheter aortic valve replacement) 03/04/2018   26 mm Edwards Sapien 3 transcatheter heart valve placed via percutaneous right transfemoral approach    Severe aortic stenosis     Past Surgical History:  Procedure Laterality Date   ABDOMINAL HYSTERECTOMY     BELPHAROPTOSIS REPAIR Bilateral    BREAST BIOPSY Left 10/09/2023   MM LT BREAST BX W LOC DEV EA AD LESION IMG BX SPEC STEREO GUIDE 10/09/2023 [facility]   BREAST BIOPSY Left 10/09/2023   MM LT BREAST BX W LOC DEV 1ST LESION IMAGE BX SPEC STEREO GUIDE 10/09/2023 [facility]   BREAST SURGERY     LUMPECTOMY WITH CHEMO & RADIATION   CATARACT EXTRACTION     CORONARY ANGIOGRAPHY N/A 05/11/2024   Procedure: CORONARY ANGIOGRAPHY;  Surgeon: Wendel Lurena POUR, MD;  Location: [facility];  Service: Cardiovascular;  Laterality: N/A;   EYE SURGERY  INTRAOPERATIVE TRANSTHORACIC ECHOCARDIOGRAM  03/04/2018   Procedure: INTRAOPERATIVE TRANSTHORACIC ECHOCARDIOGRAM;  Surgeon: Wonda Sharper, MD;  Location: [facility] OR;  Service: Open Heart Surgery;;   JOINT REPLACEMENT     BILATERAL HIPS 2007 & 2008   RIGHT/LEFT HEART CATH AND CORONARY ANGIOGRAPHY N/A 01/31/2018   Procedure: RIGHT/LEFT HEART CATH AND CORONARY ANGIOGRAPHY;  Surgeon: Verlin Lonni BIRCH, MD;  Location: [facility];  Service: Cardiovascular;  Laterality: N/A;   ROTATOR CUFF REPAIR     TONSILLECTOMY     TRANSCATHETER AORTIC VALVE REPLACEMENT, TRANSFEMORAL N/A 03/04/2018   Procedure: TRANSCATHETER AORTIC VALVE REPLACEMENT,  TRANSFEMORAL. EDWARDS SAPIEN 3 TRANSCATHETER HEART VALVE.;  Surgeon: Wonda Sharper, MD;  Location: [facility] OR;  Service: Open Heart Surgery;  Laterality: N/A;    Current Medications: Current Meds  Medication Sig   aspirin  EC 81 MG tablet Take 1 tablet (81 mg total) by mouth daily. Swallow whole.   atorvastatin  (LIPITOR) 20 MG tablet Take 20 mg by mouth at bedtime.   budesonide-formoterol (SYMBICORT) 160-4.5 MCG/ACT inhaler Inhale 2 puffs into the lungs daily as needed.   CALCIUM  CITRATE PO Take 1,200 mg by mouth daily.   Cholecalciferol (VITAMIN D3) 50 MCG (2000 UT) TABS Take 2,000 Int'l Units by mouth daily.   citalopram  (CELEXA ) 20 MG tablet Take 20 mg by mouth daily.   clobetasol cream (TEMOVATE) 0.05 % Apply 1 application topically 2 (two) times daily as needed (vaginal irritation).   empagliflozin  (JARDIANCE ) 10 MG TABS tablet Take 1 tablet (10 mg total) by mouth daily before breakfast.   estradiol (ESTRACE) 0.01 % CREA vaginal cream Place 1 Applicatorful vaginally 3 (three) times a week.   furosemide  (LASIX ) 20 MG tablet TAKE 1 TABLET BY MOUTH DAILY. PLEASE KEEP SCHEDULED APPOINTMENT FOR FUTURE REFILLS. THANK YOU.   letrozole  (FEMARA ) 2.5 MG tablet TAKE 1 TABLET BY MOUTH EVERY DAY   MAGNESIUM  PO Take 500 mg by mouth daily.   metoprolol  succinate (TOPROL  XL) 25 MG 24 hr tablet Take 1 tablet (25 mg total) by mouth daily.   Multiple Vitamin (MULTIVITAMIN) tablet Take 1 tablet by mouth daily.   pantoprazole  (PROTONIX ) 40 MG tablet Take 40 mg by mouth daily.   Probiotic Product (PROBIOTIC-10 PO) Take 1 tablet by mouth every other day. Culturelle (Patient taking differently: Take 1 tablet by mouth daily. Culturelle)   valACYclovir (VALTREX) 1000 MG tablet Take 1,000 mg by mouth daily.   [DISCONTINUED] spironolactone  (ALDACTONE ) 25 MG tablet Take 0.5 tablets (12.5 mg total) by mouth daily.   [DISCONTINUED] valsartan  (DIOVAN ) 40 MG tablet Take 1 tablet (40 mg total) by mouth 2 (two) times  daily.     Allergies:   Entresto  [sacubitril-valsartan ], Plavix  [clopidogrel  bisulfate], Codeine, and Oxycodone -acetaminophen    Social History   Socioeconomic History   Marital status: Widowed    Spouse name: Not on file   Number of children: 1   Years of education: Not on file   Highest education level: GED or equivalent  Occupational History   Not on file  Tobacco Use   Smoking status: Former    Current packs/day: 0.00    Average packs/day: 1.5 packs/day for 20.0 years (30.0 ttl pk-yrs)    Types: Cigarettes    Start date: 06/04/1970    Quit date: 06/04/1990    Years since quitting: 34.0    Passive exposure: Past   Smokeless tobacco: Never  Vaping Use   Vaping status: Never Used  Substance and Sexual Activity   Alcohol use: No  Comment: none since 1985   Drug use: No   Sexual activity: Not on file  Other Topics Concern   Not on file  Social History Narrative   Lives at home alone   Right handed   Caffeine: 1-2 cups/day of coffee   Social Drivers of Health   Financial Resource Strain: Not on file  Food Insecurity: No Food Insecurity (06/28/2021)   Hunger Vital Sign    Worried About Running Out of Food in the Last Year: Never true    Ran Out of Food in the Last Year: Never true  Transportation Needs: No Transportation Needs (06/28/2021)   PRAPARE - Administrator, Civil Service (Medical): No    Lack of Transportation (Non-Medical): No  Physical Activity: Not on file  Stress: Not on file  Social Connections: Not on file     Family History: The patient's family history includes Bladder Cancer (age of onset: 69) in her sister; Breast cancer in her cousin and cousin; Breast cancer (age of onset: 28) in her sister; Breast cancer (age of onset: 45) in her sister; Cancer in her niece; Diabetes Mellitus II in her paternal grandfather, sister, and sister; Heart Problems in her brother and sister; Heart disease in her brother, maternal grandfather, and mother;  Hepatitis C in her sister; Kidney cancer in her niece; Lung cancer in her cousin and paternal uncle; Lung cancer (age of onset: 44) in her sister; Stroke in her father; Throat cancer in her sister. There is no history of Sleep apnea. ROS:   Please see the history of present illness.    All 14 point review of systems negative except as described per history of present illness.  EKGs/Labs/Other Studies Reviewed:    The following studies were reviewed today:   EKG:       Recent Labs: 10/04/2023: TSH 1.48; TSH 1.48 02/10/2024: NT-Pro BNP 162 05/05/2024: ALT 36; BUN 20; Creatinine, Ser 1.10; Hemoglobin 14.8; Magnesium  2.3; Platelets 276; Potassium 4.8; Sodium 139  Recent Lipid Panel    Component Value Date/Time   CHOL 169 05/05/2024 1015   TRIG 154 (H) 05/05/2024 1015   HDL 42 05/05/2024 1015   CHOLHDL 4.0 05/05/2024 1015   LDLCALC 100 (H) 05/05/2024 1015    Physical Exam:    VS:  BP 108/62   Pulse 68   Ht 5' 5 (1.651 m)   Wt 216 lb 3.2 oz (98.1 kg)   SpO2 94%   BMI 35.98 kg/m     Wt Readings from Last 3 Encounters:  06/05/24 216 lb 3.2 oz (98.1 kg)  05/14/24 213 lb 8 oz (96.8 kg)  05/11/24 212 lb (96.2 kg)     GENERAL:  Well nourished, well developed in no acute distress NECK: No JVD; No carotid bruits CARDIAC: RRR, S1 and S2 present, 3/6 ejection systolic murmur best heard right upper sternal border.  CHEST:  Clear to auscultation without rales, wheezing or rhonchi  Extremities: No pitting pedal edema. Pulses bilaterally symmetric with radial 2+ and dorsalis pedis 2+.  Right radial cath access site without any significant swelling or tenderness.SABRA NEUROLOGIC:  Alert and oriented x 3  Medication Adjustments/Labs and Tests Ordered: Current medicines are reviewed at length with the patient today.  Concerns regarding medicines are outlined above.  No orders of the defined types were placed in this encounter.  Meds ordered this encounter  Medications   spironolactone   (ALDACTONE ) 25 MG tablet    Sig: Take 1 tablet (25 mg total)  by mouth daily.    Dispense:  90 tablet    Refill:  3   valsartan  (DIOVAN ) 40 MG tablet    Sig: Take 1 tablet (40 mg total) by mouth daily.    Dispense:  90 tablet    Refill:  3    Signed, Ransome Helwig reddy Levaeh Vice, MD, MPH, [facility]. 06/05/2024 9:52 AM    [facility]

## 2024-06-05 NOTE — Assessment & Plan Note (Signed)
 Continue rosuvastatin 20 mg once daily. Lipid panel 05/05/2024 noted LDL 100, HDL 42. Continue with aggressive dietary modifications. Given no significant coronary artery disease, we will hold off on any further escalation of statin therapy at this time.

## 2024-06-05 NOTE — Assessment & Plan Note (Signed)
 Well-controlled. Continue current medications for guideline directed medical therapy of cardiomyopathy as reviewed below which would also help with her blood pressure control.

## 2024-06-05 NOTE — Assessment & Plan Note (Signed)
 26 mm Edwards SAPIEN valve placed August 2019.  Last echocardiogram June 2025. Continue annual echocardiogram follow-up tentatively repeat echocardiogram in June 2026 prior to next follow-up visit in the office. Aware of antibiotic prophylaxis required prior to any surgical or invasive procedures.

## 2024-06-05 NOTE — Patient Instructions (Signed)
 Medication Instructions:  Your physician recommends that you make the following changes to your medications: Spironolactone  25 mg daily  Valsartan  40 mg one time a day Please refer to the Current Medication list given to you today.  *If you need a refill on your cardiac medications before your next appointment, please call your pharmacy*   Lab Work: None ordered If you have labs (blood work) drawn today and your tests are completely normal, you will receive your results only by: MyChart Message (if you have MyChart) OR A paper copy in the mail If you have any lab test that is abnormal or we need to change your treatment, we will call you to review the results.   Testing/Procedures: None ordered   Follow-Up: At [facility], you and your health needs are our priority.  As part of our continuing mission to provide you with exceptional heart care, we have created designated Provider Care Teams.  These Care Teams include your primary Cardiologist (physician) and Advanced Practice Providers (APPs -  Physician Assistants and Nurse Practitioners) who all work together to provide you with the care you need, when you need it.  We recommend signing up for the patient portal called MyChart.  Sign up information is provided on this After Visit Summary.  MyChart is used to connect with patients for Virtual Visits (Telemedicine).  Patients are able to view lab/test results, encounter notes, upcoming appointments, etc.  Non-urgent messages can be sent to your provider as well.   To learn more about what you can do with MyChart, go to [email or web address].    Your next appointment:   6 month(s)  The format for your next appointment:   In Person  Provider:   Alean Kobus, MD    Other Instructions none  Important Information About Sugar

## 2024-06-09 NOTE — Telephone Encounter (Signed)
 Melissa w/ [employer] called to say she has faxed a medicare-compliant order form to our office for the pt's cpap machine with pressure of 6-12. She will fax to pod 4 as well.

## 2024-06-29 DIAGNOSIS — L900 Lichen sclerosus et atrophicus: Secondary | ICD-10-CM | POA: Diagnosis not present

## 2024-06-29 DIAGNOSIS — M19071 Primary osteoarthritis, right ankle and foot: Secondary | ICD-10-CM | POA: Diagnosis not present

## 2024-06-29 DIAGNOSIS — R29898 Other symptoms and signs involving the musculoskeletal system: Secondary | ICD-10-CM | POA: Diagnosis not present

## 2024-06-29 DIAGNOSIS — S43401A Unspecified sprain of right shoulder joint, initial encounter: Secondary | ICD-10-CM | POA: Diagnosis not present

## 2024-06-29 DIAGNOSIS — B372 Candidiasis of skin and nail: Secondary | ICD-10-CM | POA: Diagnosis not present

## 2024-06-29 DIAGNOSIS — M25571 Pain in right ankle and joints of right foot: Secondary | ICD-10-CM | POA: Diagnosis not present

## 2024-06-29 DIAGNOSIS — S93401A Sprain of unspecified ligament of right ankle, initial encounter: Secondary | ICD-10-CM | POA: Diagnosis not present

## 2024-06-29 DIAGNOSIS — M25371 Other instability, right ankle: Secondary | ICD-10-CM | POA: Diagnosis not present

## 2024-08-14 ENCOUNTER — Encounter: Payer: Self-pay | Admitting: Oncology

## 2024-08-14 ENCOUNTER — Inpatient Hospital Stay: Attending: Oncology | Admitting: Oncology

## 2024-08-14 VITALS — BP 100/63 | HR 78 | Temp 97.8°F | Resp 18 | Ht 65.0 in | Wt 211.7 lb

## 2024-08-14 DIAGNOSIS — Z170 Estrogen receptor positive status [ER+]: Secondary | ICD-10-CM | POA: Diagnosis not present

## 2024-08-14 DIAGNOSIS — C50912 Malignant neoplasm of unspecified site of left female breast: Secondary | ICD-10-CM

## 2025-02-11 ENCOUNTER — Inpatient Hospital Stay: Admitting: Oncology
